# Patient Record
Sex: Female | Born: 1995 | Race: Black or African American | Hispanic: No | Marital: Single | Smoking: Former smoker
Health system: Southern US, Community
[De-identification: ages and names within clinical notes are randomized; demographics above are authoritative.]

## PROBLEM LIST (undated history)

## (undated) ENCOUNTER — Inpatient Hospital Stay (HOSPITAL_COMMUNITY): Payer: Self-pay

## (undated) DIAGNOSIS — Z3490 Encounter for supervision of normal pregnancy, unspecified, unspecified trimester: Secondary | ICD-10-CM

## (undated) DIAGNOSIS — O139 Gestational [pregnancy-induced] hypertension without significant proteinuria, unspecified trimester: Secondary | ICD-10-CM

## (undated) DIAGNOSIS — Z789 Other specified health status: Secondary | ICD-10-CM

## (undated) DIAGNOSIS — R87629 Unspecified abnormal cytological findings in specimens from vagina: Secondary | ICD-10-CM

## (undated) HISTORY — DX: Unspecified abnormal cytological findings in specimens from vagina: R87.629

## (undated) HISTORY — PX: NO PAST SURGERIES: SHX2092

## (undated) HISTORY — DX: Gestational (pregnancy-induced) hypertension without significant proteinuria, unspecified trimester: O13.9

## (undated) HISTORY — PX: OTHER SURGICAL HISTORY: SHX169

## (undated) HISTORY — DX: Encounter for supervision of normal pregnancy, unspecified, unspecified trimester: Z34.90

---
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
## 2014-02-01 ENCOUNTER — Encounter (HOSPITAL_COMMUNITY): Payer: Self-pay | Admitting: Emergency Medicine

## 2014-02-01 ENCOUNTER — Emergency Department (HOSPITAL_COMMUNITY)
Admission: EM | Admit: 2014-02-01 | Discharge: 2014-02-01 | Disposition: A | Discharge status: Home / Self Care | Payer: Medicaid Other | Attending: Emergency Medicine | Admitting: Emergency Medicine

## 2014-02-01 DIAGNOSIS — K089 Disorder of teeth and supporting structures, unspecified: Secondary | ICD-10-CM | POA: Insufficient documentation

## 2014-02-01 DIAGNOSIS — K0889 Other specified disorders of teeth and supporting structures: Secondary | ICD-10-CM

## 2014-02-01 DIAGNOSIS — Z792 Long term (current) use of antibiotics: Secondary | ICD-10-CM | POA: Insufficient documentation

## 2014-02-01 DIAGNOSIS — F17200 Nicotine dependence, unspecified, uncomplicated: Secondary | ICD-10-CM | POA: Insufficient documentation

## 2014-02-01 MED ORDER — IBUPROFEN 800 MG PO TABS: 800.0000 mg | ORAL_TABLET | Freq: Three times a day (TID) | ORAL | Status: DC

## 2014-02-01 MED ORDER — AMOXICILLIN 500 MG PO CAPS: 500.0000 mg | ORAL_CAPSULE | Freq: Three times a day (TID) | ORAL | Status: DC

## 2014-02-01 NOTE — ED Provider Notes (Signed)
CSN: [id number]     Arrival date & time 02/01/14  1515 History  This chart was scribed for Crystal Quale, PA, working with Vanetta Mulders, MD found by Elon Spanner, ED Scribe. This patient was seen in room APFT24/APFT24 and the patient's care was started at 4:28 PM.     Chief Complaint  Patient presents with  . Dental Pain    Patient is a 18 y.o. female presenting with tooth pain. The history is provided by the patient. No language interpreter was used.  Dental Pain Location:  Lower Severity:  Moderate Onset quality:  Gradual Duration:  3 days Timing:  Constant Progression:  Unchanged Chronicity:  New Context: not dental fracture   Relieved by:  Nothing Worsened by:  Nothing tried Ineffective treatments:  None tried Associated symptoms: no fever     HPI Comments: Crystal Hines is a 18 y.o. female who presents to the Emergency Department complaining of constant, gradually worsening right lower dental pain onset 3 days ago.  Patient denies any associated symptoms.  Patient denies history of DM, immune-weakening conditions.  Patient denies current pregnancy or breast feeding.  Patient denies fever, nausea, injury.   NKA.    No past medical history on file. No past surgical history on file. No family history on file. History  Substance Use Topics  . Smoking status: Current Every Day Smoker    Types: Cigarettes  . Smokeless tobacco: Not on file  . Alcohol Use: No   OB History   Grav Para Term Preterm Abortions TAB SAB Ect Mult Living                 Review of Systems  Constitutional: Negative for fever.  HENT: Positive for dental problem.   Gastrointestinal: Negative for nausea.  All other systems reviewed and are negative.     Allergies  Review of patient's allergies indicates no known allergies.  Home Medications   Prior to Admission medications   Medication Sig Start Date End Date Taking? Authorizing Provider  cephALEXin (KEFLEX) 500 MG capsule Take 1  capsule (500 mg total) by mouth 4 (four) times daily. 02/18/13   Samuel Jester, DO   BP 139/61  Pulse 93  Temp(Src) 98.5 F (36.9 C) (Oral)  Resp 18  Ht  (1.727 m)  Wt 220 lb (99.791 kg)  BMI 33.46 kg/m2  SpO2 100% Physical Exam  Nursing note and vitals reviewed. Constitutional: She is oriented to person, place, and time. She appears well-developed and well-nourished. No distress.  HENT:  Head: Normocephalic and atraumatic.  Right lower third molar erupting.  Swelling of lower gum on right.  No swelling under the tongue.  Airway is patent.  Speech is clear and understandable.   Eyes: Conjunctivae and EOM are normal.  Neck: Neck supple. No tracheal deviation present.  Cardiovascular: Normal rate.   Pulmonary/Chest: Effort normal. No respiratory distress.  Musculoskeletal: Normal range of motion.  Neurological: She is alert and oriented to person, place, and time.  Skin: Skin is warm and dry.  Psychiatric: She has a normal mood and affect. Her behavior is normal.    ED Course  Procedures (including critical care time)  DIAGNOSTIC STUDIES: Oxygen Saturation is *100**% on room air,normal by my interpretation.    COORDINATION OF CARE:  4:33 PM Will prescribed antibiotics.  Advised patient of need to follow-up.  Patient acknowledges and agrees with plan.      Labs Review Labs Reviewed - No data to display  Imaging  Review No results found.   EKG Interpretation None      MDM  No sign of Ludwig's angina. No high fever. Airway patent. No history of immune compromising med conditions.] Pt encouraged to see a dentist as soon as possible concerning the right lower 3rd molar. Rx for amoxil and ibuprofen given to the patient.   Final diagnoses:  Toothache    *I have reviewed nursing notes, vital signs, and all appropriate lab and imaging results for this patient.**  **I personally performed the services described in this documentation, which was scribed in my  presence. The recorded information has been reviewed and is accurate.Crystal Dike, PA-C 02/01/14 [phone]

## 2014-02-01 NOTE — Discharge Instructions (Signed)
Dental Pain IT IS IMPORTANT THAT YOU SEE A DENTIST AS SOON AS POSSIBLE. PLEASE USE AMOXIL AND IBUPROFEN THREE TIMES DAILY WITH FOOD.  SEE YOUR [place] MEDICAID ACCESS MD OR RETURN TO THE ED IF ANY HIGH FEVERS, OR SIGNS OF ADVANCING INFECTION.                                                                                                     Toothache is pain in or around a tooth. It may get worse with chewing or with cold or heat.  HOME CARE  Your dentist may use a numbing medicine during treatment. If so, you may need to avoid eating until the medicine wears off. Ask your dentist about this.  Only take medicine as told by your dentist or doctor.  Avoid chewing food near the painful tooth until after all treatment is done. Ask your dentist about this. GET HELP RIGHT AWAY IF:   The problem gets worse or new problems appear.  You have a fever.  There is redness and puffiness (swelling) of the face, jaw, or neck.  You cannot open your mouth.  There is pain in the jaw.  There is very bad pain that is not helped by medicine. MAKE SURE YOU:   Understand these instructions.  Will watch your condition.  Will get help right away if you are not doing well or get worse. Document Released: 11/14/2007 Document Revised: 08/20/2011 Document Reviewed: 11/14/2007 [facility] Patient Information 2015 [place], [place]. This information is not intended to replace advice given to you by your health care provider. Make sure you discuss any questions you have with your health care provider.

## 2014-02-01 NOTE — ED Notes (Signed)
Pt with dental pain for 3 days, denies seeing a dentist

## 2014-02-02 NOTE — ED Provider Notes (Signed)
Medical screening examination/treatment/procedure(s) were performed by non-physician practitioner and as supervising physician I was immediately available for consultation/collaboration.   EKG Interpretation None        Vanetta Mulders, MD 02/02/14 [phone]

## 2014-12-06 ENCOUNTER — Encounter (HOSPITAL_COMMUNITY): Payer: Self-pay | Admitting: *Deleted

## 2014-12-06 ENCOUNTER — Emergency Department (HOSPITAL_COMMUNITY)
Admission: EM | Admit: 2014-12-06 | Discharge: 2014-12-06 | Disposition: A | Discharge status: Home / Self Care | Payer: Medicaid Other | Attending: Emergency Medicine | Admitting: Emergency Medicine

## 2014-12-06 ENCOUNTER — Emergency Department (HOSPITAL_COMMUNITY): Payer: Medicaid Other

## 2014-12-06 DIAGNOSIS — R0789 Other chest pain: Secondary | ICD-10-CM

## 2014-12-06 DIAGNOSIS — R002 Palpitations: Secondary | ICD-10-CM | POA: Insufficient documentation

## 2014-12-06 DIAGNOSIS — Z720 Tobacco use: Secondary | ICD-10-CM | POA: Insufficient documentation

## 2014-12-06 LAB — CBC WITH DIFFERENTIAL/PLATELET
BASOS ABS: 0 10*3/uL (ref 0.0–0.1)
Basophils Relative: 0 % (ref 0–1)
EOS PCT: 1 % (ref 0–5)
Eosinophils Absolute: 0.1 10*3/uL (ref 0.0–0.7)
HEMATOCRIT: 40.8 % (ref 36.0–46.0)
HEMOGLOBIN: 13 g/dL (ref 12.0–15.0)
LYMPHS ABS: 2.5 10*3/uL (ref 0.7–4.0)
LYMPHS PCT: 38 % (ref 12–46)
MCH: 26.4 pg (ref 26.0–34.0)
MCHC: 31.9 g/dL (ref 30.0–36.0)
MCV: 82.9 fL (ref 78.0–100.0)
MONO ABS: 0.4 10*3/uL (ref 0.1–1.0)
MONOS PCT: 5 % (ref 3–12)
Neutro Abs: 3.7 10*3/uL (ref 1.7–7.7)
Neutrophils Relative %: 56 % (ref 43–77)
Platelets: 291 10*3/uL (ref 150–400)
RBC: 4.92 MIL/uL (ref 3.87–5.11)
RDW: 14.9 % (ref 11.5–15.5)
WBC: 6.6 10*3/uL (ref 4.0–10.5)

## 2014-12-06 LAB — URINALYSIS, ROUTINE W REFLEX MICROSCOPIC
BILIRUBIN URINE: NEGATIVE
Glucose, UA: NEGATIVE mg/dL
HGB URINE DIPSTICK: NEGATIVE
Ketones, ur: NEGATIVE mg/dL
LEUKOCYTES UA: NEGATIVE
NITRITE: NEGATIVE
PROTEIN: NEGATIVE mg/dL
Specific Gravity, Urine: 1.01 (ref 1.005–1.030)
UROBILINOGEN UA: 0.2 mg/dL (ref 0.0–1.0)
pH: 7 (ref 5.0–8.0)

## 2014-12-06 LAB — BASIC METABOLIC PANEL
ANION GAP: 8 (ref 5–15)
BUN: 8 mg/dL (ref 6–20)
CHLORIDE: 108 mmol/L (ref 101–111)
CO2: 24 mmol/L (ref 22–32)
Calcium: 9.1 mg/dL (ref 8.9–10.3)
Creatinine, Ser: 0.79 mg/dL (ref 0.44–1.00)
GFR calc Af Amer: >60 mL/min (ref >60)
GFR calc non Af Amer: >60 mL/min (ref >60)
GLUCOSE: 99 mg/dL (ref 65–99)
POTASSIUM: 3.8 mmol/L (ref 3.5–5.1)
SODIUM: 140 mmol/L (ref 135–145)

## 2014-12-06 LAB — I-STAT TROPONIN, ED: TROPONIN I, POC: 0 ng/mL (ref 0.00–0.08)

## 2014-12-06 LAB — PREGNANCY, URINE: Preg Test, Ur: NEGATIVE

## 2014-12-06 MED ORDER — IBUPROFEN 400 MG PO TABS
400.0000 mg | ORAL_TABLET | Freq: Once | ORAL | Status: AC
  Administered 2014-12-06: 400 mg via ORAL
  Filled 2014-12-06: qty 1

## 2014-12-06 MED ORDER — METHOCARBAMOL 500 MG PO TABS: 1000.0000 mg | ORAL_TABLET | Freq: Four times a day (QID) | ORAL | Status: DC | PRN

## 2014-12-06 MED ORDER — NAPROXEN 250 MG PO TABS: 250.0000 mg | ORAL_TABLET | Freq: Two times a day (BID) | ORAL | Status: DC | PRN

## 2014-12-06 MED ORDER — ACETAMINOPHEN 500 MG PO TABS
1000.0000 mg | ORAL_TABLET | Freq: Once | ORAL | Status: AC
  Administered 2014-12-06: 1000 mg via ORAL
  Filled 2014-12-06: qty 2

## 2014-12-06 NOTE — ED Notes (Signed)
Chest tightness and SOB since waking this morning. Palpitations, feeling hot x 5 today. Has also had chills today.  Has lightheadedness and pounding HA.  Has never had this before but states her mother told her it was panic attacks.

## 2014-12-06 NOTE — ED Provider Notes (Signed)
CSN: [id number]     Arrival date & time 12/06/14  1633 History   First MD Initiated Contact with Patient 12/06/14 1923     Chief Complaint  Patient presents with  . Panic Attack    HPI  Pt was seen at 1955. Per pt, c/o gradual onset and persistence of constant multiple symptoms since waking up this morning. Pt's symptoms include: generalized chest "tightness," SOB, palpitations, and "feeling hot." Pt's mother told her "it was a panic attack." Pt will not be forthcoming as to why her mother told her this. Denies injury, no fevers, no rash, no abd pain, no N/V/D, no cough, no back pain, no calf/LE pain or unilateral swelling.    History reviewed. No pertinent past medical history.   History reviewed. No pertinent past surgical history.  History  Substance Use Topics  . Smoking status: Current Every Day Smoker    Types: Cigarettes  . Smokeless tobacco: Not on file  . Alcohol Use: No    Review of Systems ROS: Statement: All systems negative except as marked or noted in the HPI; Constitutional: Negative for fever and chills. +"feeling hot."; ; Eyes: Negative for eye pain, redness and discharge. ; ; ENMT: Negative for ear pain, hoarseness, nasal congestion, sinus pressure and sore throat. ; ; Cardiovascular: +palpitations. Negative for diaphoresis, and peripheral edema. ; ; Respiratory: +SOB. Negative for cough, wheezing and stridor. ; ; Gastrointestinal: Negative for nausea, vomiting, diarrhea, abdominal pain, blood in stool, hematemesis, jaundice and rectal bleeding. . ; ; Genitourinary: Negative for dysuria, flank pain and hematuria. ; ; Musculoskeletal: +chest pain. Negative for back pain and neck pain. Negative for swelling and trauma.; ; Skin: Negative for pruritus, rash, abrasions, blisters, bruising and skin lesion.; ; Neuro: Negative for headache, lightheadedness and neck stiffness. Negative for weakness, altered level of consciousness , altered mental status, extremity weakness,  paresthesias, involuntary movement, seizure and syncope.; Psych:  No SI, no SA, no HI, no hallucinations.      Allergies  Review of patient's allergies indicates no known allergies.  Home Medications   Prior to Admission medications   Not on File   BP 137/65 mmHg  Pulse 66  Temp(Src) 98.7 F (37.1 C) (Oral)  Resp 16  Ht  (1.727 m)  Wt 237 lb (107.502 kg)  BMI 36.04 kg/m2  SpO2 98%   19:35:21 Orthostatic Vital Signs YW  Orthostatic Lying  - BP- Lying: 115/68 mmHg ; Pulse- Lying: 54  Orthostatic Sitting - BP- Sitting: 126/74 mmHg ; Pulse- Sitting: 62  Orthostatic Standing at 0 minutes - BP- Standing at 0 minutes: 142/71 mmHg ; Pulse- Standing at 0 minutes: 87     Physical Exam  2000: Physical examination:  Nursing notes reviewed; Vital signs and O2 SAT reviewed;  Constitutional: Well developed, Well nourished, Well hydrated, In no acute distress; Head:  Normocephalic, atraumatic; Eyes: EOMI, PERRL, No scleral icterus; ENMT: Mouth and pharynx normal, Mucous membranes moist; Neck: Supple, Full range of motion, No lymphadenopathy. No meningeal signs.; Cardiovascular: Regular rate and rhythm, No murmur, rub, or gallop; Respiratory: Breath sounds clear & equal bilaterally, No rales, rhonchi, wheezes.  Speaking full sentences with ease, Normal respiratory effort/excursion; Chest: No deformity, Movement normal; Abdomen: Soft, Nontender, Nondistended, Normal bowel sounds; Genitourinary: No CVA tenderness; Extremities: Pulses normal, No tenderness, No edema, No calf edema or asymmetry.; Neuro: AA&Ox3, Major CN grossly intact. No facial droop. Speech clear. No gross focal motor or sensory deficits in extremities.; Skin: Color normal, Warm, Dry.;  Psych:  Affect flat, poor eye contact. Watching TV during my HPI.      ED Course  Procedures     EKG Interpretation None      MDM  MDM Reviewed: nursing note, vitals and previous chart Interpretation: ECG, x-ray and labs     ED ECG  REPORT   Date: 12/06/2014  Rate: 75  Rhythm: normal sinus rhythm  QRS Axis: normal  Intervals: normal  ST/T Wave abnormalities: normal  Conduction Disutrbances:none  Narrative Interpretation:   Old EKG Reviewed: none available  I have personally reviewed the EKG tracing and agree with the computerized printout as noted.   Results for orders placed or performed during the hospital encounter of 12/06/14  CBC with Differential  Result Value Ref Range   WBC 6.6 4.0 - 10.5 K/uL   RBC 4.92 3.87 - 5.11 MIL/uL   Hemoglobin 13.0 12.0 - 15.0 g/dL   HCT 16.140.8 09.636.0 - 04.546.0 %   MCV 82.9 78.0 - 100.0 fL   MCH 26.4 26.0 - 34.0 pg   MCHC 31.9 30.0 - 36.0 g/dL   RDW 40.914.9 81.111.5 - 91.415.5 %   Platelets 291 150 - 400 K/uL   Neutrophils Relative % 56 43 - 77 %   Neutro Abs 3.7 1.7 - 7.7 K/uL   Lymphocytes Relative 38 12 - 46 %   Lymphs Abs 2.5 0.7 - 4.0 K/uL   Monocytes Relative 5 3 - 12 %   Monocytes Absolute 0.4 0.1 - 1.0 K/uL   Eosinophils Relative 1 0 - 5 %   Eosinophils Absolute 0.1 0.0 - 0.7 K/uL   Basophils Relative 0 0 - 1 %   Basophils Absolute 0.0 0.0 - 0.1 K/uL  Basic metabolic panel  Result Value Ref Range   Sodium 140 135 - 145 mmol/L   Potassium 3.8 3.5 - 5.1 mmol/L   Chloride 108 101 - 111 mmol/L   CO2 24 22 - 32 mmol/L   Glucose, Bld 99 65 - 99 mg/dL   BUN 8 6 - 20 mg/dL   Creatinine, Ser 7.820.79 0.44 - 1.00 mg/dL   Calcium 9.1 8.9 - 95.610.3 mg/dL   GFR calc non Af Amer >60 >60 mL/min   GFR calc Af Amer >60 >60 mL/min   Anion gap 8 5 - 15  Pregnancy, urine  Result Value Ref Range   Preg Test, Ur NEGATIVE NEGATIVE  Urinalysis, Routine w reflex microscopic ([facility])  Result Value Ref Range   Color, Urine YELLOW YELLOW   APPearance CLEAR CLEAR   Specific Gravity, Urine 1.010 1.005 - 1.030   pH 7.0 5.0 - 8.0   Glucose, UA NEGATIVE NEGATIVE mg/dL   Hgb urine dipstick NEGATIVE NEGATIVE   Bilirubin Urine NEGATIVE NEGATIVE   Ketones, ur NEGATIVE NEGATIVE mg/dL   Protein, ur  NEGATIVE NEGATIVE mg/dL   Urobilinogen, UA 0.2 0.0 - 1.0 mg/dL   Nitrite NEGATIVE NEGATIVE   Leukocytes, UA NEGATIVE NEGATIVE  I-stat troponin, ED  Result Value Ref Range   Troponin i, poc 0.00 0.00 - 0.08 ng/mL   Comment 3           Dg Chest 2 View 12/06/2014   CLINICAL DATA:  Chest pain and shortness of breath.  EXAM: CHEST  2 VIEW  COMPARISON:  July 09, 2013  FINDINGS: Lungs are clear. Heart size and pulmonary vascularity are normal. No pneumothorax. No adenopathy. No bone lesions.  IMPRESSION: No abnormality noted.   Electronically Signed   By:  Bretta Bang III M.D.   On: 12/06/2014 16:56    2120:  CP improving after meds. Pt is currently texting on her cellphone and watching TV. Appears NAD, resps easy. VS remain stable. Doubt PE as cause for symptoms with low risk [place].  Doubt ACS as cause for symptoms with normal troponin and EKG after 12+ hours of constant symptoms. Tx symptomatically at this time. Dx and testing d/w pt and family.  Questions answered.  Verb understanding, agreeable to d/c home with outpt f/u.   Samuel Jester, DO 12/09/14 [phone]

## 2014-12-06 NOTE — Discharge Instructions (Signed)
°Emergency Department Resource Guide °1) Find a Doctor and Pay Out of Pocket °Although you won't have to find out who is covered by your insurance plan, it is a good idea to ask around and get recommendations. You will then need to call the office and see if the doctor you have chosen will accept you as a new patient and what types of options they offer for patients who are self-pay. Some doctors offer discounts or will set up payment plans for their patients who do not have insurance, but you will need to ask so you aren't surprised when you get to your appointment. ° °2) Contact Your Local Health Department °Not all health departments have doctors that can see patients for sick visits, but many do, so it is worth a call to see if yours does. If you don't know where your local health department is, you can check in your phone book. The CDC also has a tool to help you locate your state's health department, and many state websites also have listings of all of their local health departments. ° °3) Find a Walk-in Clinic °If your illness is not likely to be very severe or complicated, you may want to try a walk in clinic. These are popping up all over the country in pharmacies, drugstores, and shopping centers. They're usually staffed by nurse practitioners or physician assistants that have been trained to treat common illnesses and complaints. They're usually fairly quick and inexpensive. However, if you have serious medical issues or chronic medical problems, these are probably not your best option. ° °No Primary Care Doctor: °- Call [facility] Connect at  832-8000 - they can help you locate a primary care doctor that  accepts your insurance, provides certain services, etc. °- Physician Referral Service- 1-800-533-3463 ° °Chronic Pain Problems: °Organization         Address  Phone   Notes  °[facility]  (336) [phone] Patients need to be referred by their primary care doctor.  ° °Medication  Assistance: °Organization         Address  Phone   Notes  °[place] [facility] [address] °[place], [place] [address] (336) 641-8030 --Must be a resident of [place] °-- Must have NO insurance coverage whatsoever (no Medicaid/ Medicare, etc.) °-- The pt. MUST have a primary care doctor that directs their care regularly and follows them in the community °  °[facility]  (866) 331-1348   °[employer]  (888) 892-1162   ° °Agencies that provide inexpensive medical care: °Organization         Address  Phone   Notes  °[facility]  (336) 832-8035   °[facility]    (336) 832-7272   °[facility] [address] °[place], [place] [address] (336) 832-4777   °[facility] [address], °[place] (336) 271-4999   °[facility]    (336) 373-0678   °[facility]    (336) 272-1050   °[facility] ° [address], [place] Phone:  (336) 832-4444, Fax:  (336) 832-4440 Hours of Operation:  9 am - 6 pm, M-F.  Also accepts Medicaid/Medicare and self-pay.  °[facility] ° [address], [place] Phone: (336) [phone], Fax: (336) [phone]. Hours of Operation:  8:30 am - 5:30 pm, M-F.  Also accepts Medicaid and self-pay.  °[facility] [place] [address], [place] Phone: (336) 878-6027   °[facility] [address], [place], [place] (336)723-1848, Ext. 123 Mondays & Thursdays: 7-9 AM.  First 15 patients are seen on a first come, first serve basis. °  ° °Medicaid-accepting [place] Providers: ° °Organization         Address  Phone   Notes  °[facility] [address], [place] (336) 641-2100 Also accepts self-pay patients.  °[facility] [address], [place] ° (336) 856-9996   °[facility] [address], [place]  (336) 288-8857   °[facility] [address] [place] [address], [place] (336) 299-7000   °Veita Bland [address], [place]  ° (336) 373-1557 Only accepts [place] Access Medicaid patients after they have their name applied to their card.  ° °Self-Pay (no insurance) in [place]: ° °Organization         Address  Phone   Notes  °Sickle Cell Patients, [facility] [address], [place] (336) 832-1970   °[facility] [address], [place] (336) 832-4400   °[facility] ° [address], [place] (336) 992-4800   °[facility]/Dr. Osei-Bonsu ° [address] [place] [address], [place] or [address], [place] (336) 841-8500 Phone number for both [place] and [place] locations is the same.  °[facility] [address], [place] (336) 299-0000   °[facility] [address] [place] [address], [place] or [address] (336) 852-7530 °(336) 878-2260   °[facility] [address], [place] (336) 350-1642, phone; (336) 294-5005, fax Sees patients 1st and 3rd Saturday of every month.  Must not qualify for public or private insurance (i.e. Medicaid, Medicare, [place] Health Choice, Veterans' Benefits) • Household income should be no more than 200% of the poverty level •The clinic cannot treat you if you are pregnant or think you are pregnant • Sexually transmitted diseases are not treated at the clinic.  ° ° °Dental Care: °Organization         Address  Phone  Notes  °[place] [facility] [address], [place] (336) 641-6152 Accepts children up to age 21 who are enrolled in Medicaid or [place] Health Choice; pregnant women with a Medicaid card; and children who have applied for Medicaid or [place] Health Choice, but were declined, whose parents can pay a reduced fee at time of service.  °[place]  [facility] [place]  [address], [place] (336) 641-7733 Accepts children up to age 21 who are enrolled in Medicaid or [place] Health Choice; pregnant women with a Medicaid card; and children who have applied for Medicaid or [place] Health Choice, but were declined, whose parents can pay a reduced fee at time of service.  °[facility] ° [address], [place] (336) 641-4533 Patients are seen by appointment only. Walk-ins are not accepted. [facility] will see patients 18 years of age and older. °Monday - Tuesday (8am-5pm) °Most Wednesdays (8:30-5pm) °$30 per visit, cash only  °[facility] ° [address], [place] (336) 641-4533 Patients are seen by appointment only. Walk-ins are not accepted. [facility] will see patients 18 years of age and older. °One   Wednesday Evening (Monthly: Volunteer Based).  $30 per visit, cash only  °[facility]  (919) 537-3737 for adults; Children under age 4, call Graduate Pediatric Dentistry at ([phone]) [phone]. Children aged 4-14, please call ([phone]) [phone] to request a pediatric application. ° Dental services are provided in all areas of dental care including fillings, crowns and bridges, complete and partial dentures, implants, gum treatment, root canals, and extractions. Preventive care is also provided. Treatment is provided to both adults and children. °Patients are selected via a lottery and there is often a waiting list. °  °[facility] [address], °[place] ° (336) 763-8833 [email or web address] °  °[facility] [address], [place], [place] (336)723-1848, Ext. 123 Second and Fourth Thursday of each month, opens at 6:30 AM; Clinic ends at 9 AM.  Patients are seen on a first-come first-served basis, and a limited number are seen during each clinic.  ° °[facility] ° [address], [place], [place] (336) 723-7904    Eligibility Requirements °You must have lived in [place], [place], or [place] counties for at least the last three months. °  You cannot be eligible for state or federal sponsored healthcare insurance, including Veterans Administration, Medicaid, or Medicare. °  You generally cannot be eligible for healthcare insurance through your employer.  °  How to apply: °Eligibility screenings are held every Tuesday and Wednesday afternoon from 1:00 pm until 4:00 pm. You do not need an appointment for the interview!  °[facility] [address], [place], [place] 336-631-2330   °[facility]  336-342-8273   °[facility]  336-703-3100   °[facility]  336-570-6415   ° °Behavioral Health Resources in the Community: °Intensive Outpatient Programs °Organization         Address  Phone  Notes  °[facility] [address], [place], [place] 336-878-6098   °[facility] [address], [place], [place] 336-832-9800   °[facility] [address], [place], [place] ° 336-882-2125   °[facility] [address],  °[place], [place] 1-800-853-5163 or 336-641-4981   °Substance Abuse Resources °Organization         Address  Phone  Notes  °[facility]  336-882-2125   °[facility]  336-784-9470   °[facility]  336-285-9073   °[facility]  336-845-3988   °Residential & Outpatient Substance Abuse Program  1-800-659-3381   °Psychological Services °Organization         Address  Phone  Notes  °[facility]  336- 832-9600   °[facility]  336- 378-7881   °[facility] [address], [place] 1-800-853-5163 or 336-641-4981   ° °Mobile Crisis Teams °Organization         Address  Phone  Notes  °[facility], Mobile Crisis Care Unit  1-877-626-1772   °Assertive °[facility] ° [address]  [place], [place] 336-834-9664   °Sharon DeEsch [address] °[place] 336-554-5454   ° °Self-Help/Support Groups °Organization         Address  Phone             Notes  °[facility]  - variety of support groups  336- 373-1402 Call for more information  °Narcotics Anonymous (NA), [facility] [address], °[place]  2 meetings at this location  ° °  Residential Treatment Programs Organization         Address  Phone  Notes  [facility] [address],    [place]  [phone]   [facility]  [address], [place] [phone], [place], [place] [phone]   [facility] [address], [place] [phone] Admissions: 8am-3pm M-F  [facility] [address]    [place], [place] [phone]([phone]) [phone]   [facility] [address], [place], [place] [phone]) [phone]   [facility] [address]  [place], [place] [phone]   [facility] Intensive Outpatient [address], [place], [place] [phone]   [facility] ([facility] [address]  [place], [place] [phone] or [phone]   [facility] [address] [place], [place] [phone] Accepts Medicaid  [facility] [address]  [place] [phone] Substance Abuse/Addiction Treatment   [facility] Organization         Address  Phone  Notes  [facility]  (424)[phone](888) [phone]   Angie FavaJulie Brannon, PhD [address], [address] [place], [place]   [phone](336) (520)[phone] or [phone](336) [phone]   [facility]   [address] [place], [place] [phone](336) [phone]   [facility] [address], [place], [place] ([phone](336) ([phone] Insurance/Medicaid/sponsorship through [facility] [address]                                    [place], [place] [phone](336) [phone] Therapy/tele-psych/case    [facility] [address]   [place], [place] [phone](336) [phone]    Dr. Lolly MustacheArfeen  [phone](336) [phone]   [facility]  [employer] [facility] 1) [address], [place] 2) [address], [place] 3)  [address], [place] ([phone]) [phone](336) [phone](336) [phone]  [phone](336) [phone]   [facility] Child Abuse Hot[facility] (510) 367-9302(336) (724)433-4727 or 747-123-2417(336) 269-184-0009 (After Hours)      Take the prescriptions as directed. Also take over the counter tylenol, as directed on packaging, as needed for discomfort. Apply moist heat or ice to the area(s) of discomfort, for 15 minutes at a time, several times per day for the next few days.  Do not fall asleep on a heating or ice pack.  Call your regular medical doctor tomorrow to schedule a follow up appointment this week.  Return to the Emergency Department immediately if worsening.

## 2015-08-23 ENCOUNTER — Encounter: Payer: Self-pay | Admitting: Adult Health

## 2015-08-23 ENCOUNTER — Ambulatory Visit (INDEPENDENT_AMBULATORY_CARE_PROVIDER_SITE_OTHER): Payer: Medicaid Other | Admitting: Adult Health

## 2015-08-23 VITALS — BP 150/70 | HR 80 | Ht 68.0 in | Wt 232.0 lb

## 2015-08-23 DIAGNOSIS — N926 Irregular menstruation, unspecified: Secondary | ICD-10-CM

## 2015-08-23 DIAGNOSIS — R51 Headache: Secondary | ICD-10-CM

## 2015-08-23 DIAGNOSIS — Z3201 Encounter for pregnancy test, result positive: Secondary | ICD-10-CM | POA: Diagnosis not present

## 2015-08-23 DIAGNOSIS — Z3490 Encounter for supervision of normal pregnancy, unspecified, unspecified trimester: Secondary | ICD-10-CM

## 2015-08-23 DIAGNOSIS — R252 Cramp and spasm: Secondary | ICD-10-CM | POA: Diagnosis not present

## 2015-08-23 DIAGNOSIS — O3680X0 Pregnancy with inconclusive fetal viability, not applicable or unspecified: Secondary | ICD-10-CM

## 2015-08-23 HISTORY — DX: Encounter for supervision of normal pregnancy, unspecified, unspecified trimester: Z34.90

## 2015-08-23 LAB — POCT URINE PREGNANCY: Preg Test, Ur: POSITIVE — AB

## 2015-08-23 MED ORDER — PRENATAL PLUS 27-1 MG PO TABS: 1.0000 | ORAL_TABLET | Freq: Every day | ORAL | Status: DC

## 2015-08-23 NOTE — Patient Instructions (Addendum)
First Trimester of Pregnancy The first trimester of pregnancy is from week 1 until the end of week 12 (months 1 through 3). A week after a sperm fertilizes an egg, the egg will implant on the wall of the uterus. This embryo will begin to develop into a baby. Genes from you and your partner are forming the baby. The female genes determine whether the baby is a boy or a girl. At 6-8 weeks, the eyes and face are formed, and the heartbeat can be seen on ultrasound. At the end of 12 weeks, all the baby's organs are formed.  Now that you are pregnant, you will want to do everything you can to have a healthy baby. Two of the most important things are to get good prenatal care and to follow your health care provider's instructions. Prenatal care is all the medical care you receive before the baby's birth. This care will help prevent, find, and treat any problems during the pregnancy and childbirth. BODY CHANGES Your body goes through many changes during pregnancy. The changes vary from woman to woman.   You may gain or lose a couple of pounds at first.  You may feel sick to your stomach (nauseous) and throw up (vomit). If the vomiting is uncontrollable, call your health care provider.  You may tire easily.  You may develop headaches that can be relieved by medicines approved by your health care provider.  You may urinate more often. Painful urination may mean you have a bladder infection.  You may develop heartburn as a result of your pregnancy.  You may develop constipation because certain hormones are causing the muscles that push waste through your intestines to slow down.  You may develop hemorrhoids or swollen, bulging veins (varicose veins).  Your breasts may begin to grow larger and become tender. Your nipples may stick out more, and the tissue that surrounds them (areola) may become darker.  Your gums may bleed and may be sensitive to brushing and flossing.  Dark spots or blotches (chloasma,  mask of pregnancy) may develop on your face. This will likely fade after the baby is born.  Your menstrual periods will stop.  You may have a loss of appetite.  You may develop cravings for certain kinds of food.  You may have changes in your emotions from day to day, such as being excited to be pregnant or being concerned that something may go wrong with the pregnancy and baby.  You may have more vivid and strange dreams.  You may have changes in your hair. These can include thickening of your hair, rapid growth, and changes in texture. Some women also have hair loss during or after pregnancy, or hair that feels dry or thin. Your hair will most likely return to normal after your baby is born. WHAT TO EXPECT AT YOUR PRENATAL VISITS During a routine prenatal visit:  You will be weighed to make sure you and the baby are growing normally.  Your blood pressure will be taken.  Your abdomen will be measured to track your baby's growth.  The fetal heartbeat will be listened to starting around week 10 or 12 of your pregnancy.  Test results from any previous visits will be discussed. Your health care provider may ask you:  How you are feeling.  If you are feeling the baby move.  If you have had any abnormal symptoms, such as leaking fluid, bleeding, severe headaches, or abdominal cramping.  If you are using any tobacco products,   including cigarettes, chewing tobacco, and electronic cigarettes.  If you have any questions. Other tests that may be performed during your first trimester include:  Blood tests to find your blood type and to check for the presence of any previous infections. They will also be used to check for low iron levels (anemia) and Rh antibodies. Later in the pregnancy, blood tests for diabetes will be done along with other tests if problems develop.  Urine tests to check for infections, diabetes, or protein in the urine.  An ultrasound to confirm the proper growth  and development of the baby.  An amniocentesis to check for possible genetic problems.  Fetal screens for spina bifida and Down syndrome.  You may need other tests to make sure you and the baby are doing well.  HIV (human immunodeficiency virus) testing. Routine prenatal testing includes screening for HIV, unless you choose not to have this test. HOME CARE INSTRUCTIONS  Medicines  Follow your health care provider's instructions regarding medicine use. Specific medicines may be either safe or unsafe to take during pregnancy.  Take your prenatal vitamins as directed.  If you develop constipation, try taking a stool softener if your health care provider approves. Diet  Eat regular, well-balanced meals. Choose a variety of foods, such as meat or vegetable-based protein, fish, milk and low-fat dairy products, vegetables, fruits, and whole grain breads and cereals. Your health care provider will help you determine the amount of weight gain that is right for you.  Avoid raw meat and uncooked cheese. These carry germs that can cause birth defects in the baby.  Eating four or five small meals rather than three large meals a day may help relieve nausea and vomiting. If you start to feel nauseous, eating a few soda crackers can be helpful. Drinking liquids between meals instead of during meals also seems to help nausea and vomiting.  If you develop constipation, eat more high-fiber foods, such as fresh vegetables or fruit and whole grains. Drink enough fluids to keep your urine clear or pale yellow. Activity and Exercise  Exercise only as directed by your health care provider. Exercising will help you:  Control your weight.  Stay in shape.  Be prepared for labor and delivery.  Experiencing pain or cramping in the lower abdomen or low back is a good sign that you should stop exercising. Check with your health care provider before continuing normal exercises.  Try to avoid standing for long  periods of time. Move your legs often if you must stand in one place for a long time.  Avoid heavy lifting.  Wear low-heeled shoes, and practice good posture.  You may continue to have sex unless your health care provider directs you otherwise. Relief of Pain or Discomfort  Wear a good support bra for breast tenderness.   Take warm sitz baths to soothe any pain or discomfort caused by hemorrhoids. Use hemorrhoid cream if your health care provider approves.   Rest with your legs elevated if you have leg cramps or low back pain.  If you develop varicose veins in your legs, wear support hose. Elevate your feet for 15 minutes, 3-4 times a day. Limit salt in your diet. Prenatal Care  Schedule your prenatal visits by the twelfth week of pregnancy. They are usually scheduled monthly at first, then more often in the last 2 months before delivery.  Write down your questions. Take them to your prenatal visits.  Keep all your prenatal visits as directed by your   health care provider. Safety  Wear your seat belt at all times when driving.  Make a list of emergency phone numbers, including numbers for family, friends, the hospital, and police and fire departments. General Tips  Ask your health care provider for a referral to a local prenatal education class. Begin classes no later than at the beginning of month 6 of your pregnancy.  Ask for help if you have counseling or nutritional needs during pregnancy. Your health care provider can offer advice or refer you to specialists for help with various needs.  Do not use hot tubs, steam rooms, or saunas.  Do not douche or use tampons or scented sanitary pads.  Do not cross your legs for long periods of time.  Avoid cat litter boxes and soil used by cats. These carry germs that can cause birth defects in the baby and possibly loss of the fetus by miscarriage or stillbirth.  Avoid all smoking, herbs, alcohol, and medicines not prescribed by  your health care provider. Chemicals in these affect the formation and growth of the baby.  Do not use any tobacco products, including cigarettes, chewing tobacco, and electronic cigarettes. If you need help quitting, ask your health care provider. You may receive counseling support and other resources to help you quit.  Schedule a dentist appointment. At home, brush your teeth with a soft toothbrush and be gentle when you floss. SEEK MEDICAL CARE IF:   You have dizziness.  You have mild pelvic cramps, pelvic pressure, or nagging pain in the abdominal area.  You have persistent nausea, vomiting, or diarrhea.  You have a bad smelling vaginal discharge.  You have pain with urination.  You notice increased swelling in your face, hands, legs, or ankles. SEEK IMMEDIATE MEDICAL CARE IF:   You have a fever.  You are leaking fluid from your vagina.  You have spotting or bleeding from your vagina.  You have severe abdominal cramping or pain.  You have rapid weight gain or loss.  You vomit blood or material that looks like coffee grounds.  You are exposed to [place] measles and have never had them.  You are exposed to fifth disease or chickenpox.  You develop a severe headache.  You have shortness of breath.  You have any kind of trauma, such as from a fall or a car accident.   This information is not intended to replace advice given to you by your health care provider. Make sure you discuss any questions you have with your health care provider.   Document Released: 05/22/2001 Document Revised: 06/18/2014 Document Reviewed: 04/07/2013 Elsevier Interactive Patient Education [employer]2016 [employer] Inc. Return in 2 weeks for dating [place] Eat often  Ok take to tylenol

## 2015-08-23 NOTE — Progress Notes (Signed)
Subjective:     Patient ID: Crystal KendallElexus K Hines, female   DOB: Sep 20, 1995, 20 y.o.   MRN: [id number]  HPI [name] is a 20 year old black female, in for UPT, has had 2+ HPT after missing a period.Has some cramps, no bleeding or nausea, has headache almost daily and breast tenderness.Has not taken any tylenol for headache yet.   Review of Systems Patient denies any hearing loss, fatigue, blurred vision, shortness of breath, chest pain, abdominal pain, problems with bowel movements, urination, or intercourse. No joint pain or mood swings. See HPI for positives. Reviewed past medical,surgical, social and family history. Reviewed medications and allergies.     Objective:   Physical Exam BP 150/70 mmHg  Pulse 80  Ht 5\' 8"  (1.727 m)  Wt 232 lb (105.235 kg)  BMI 35.28 kg/m2  LMP 07/16/2015 UPT + about 5+ 3 weeks by LMP, EDD 04/21/16, medicaid form given, Skin warm and dry. Neck: mid line trachea, normal thyroid, good ROM, no lymphadenopathy noted. Lungs: clear to ausculation bilaterally. Cardiovascular: regular rate and rhythm.Abdomen soft and non tender, no HSM    Assessment:     +UPT Pregnant    Plan:    Eat often and increase fluids  Rx prenatal plus #30 take 1 daily with 11 refills,ok to do Gummies for now or 2 flintstones Return in 2 weeks for dating US Review handout on first trimester Ok to take tylenol

## 2015-09-06 ENCOUNTER — Ambulatory Visit (INDEPENDENT_AMBULATORY_CARE_PROVIDER_SITE_OTHER): Payer: Medicaid Other

## 2015-09-06 DIAGNOSIS — O3680X0 Pregnancy with inconclusive fetal viability, not applicable or unspecified: Secondary | ICD-10-CM | POA: Diagnosis not present

## 2015-09-06 NOTE — Progress Notes (Signed)
US 7+3 wks,single IUP pos fht 113 bpm,normal ov's bilat,crl 11.423mm

## 2015-09-15 ENCOUNTER — Encounter: Payer: Medicaid Other | Admitting: Advanced Practice Midwife

## 2015-09-26 ENCOUNTER — Encounter (HOSPITAL_COMMUNITY): Payer: Self-pay | Admitting: Emergency Medicine

## 2015-09-26 ENCOUNTER — Emergency Department (HOSPITAL_COMMUNITY): Payer: Medicaid Other

## 2015-09-26 ENCOUNTER — Emergency Department (HOSPITAL_COMMUNITY)
Admission: EM | Admit: 2015-09-26 | Discharge: 2015-09-26 | Disposition: A | Discharge status: Home / Self Care | Payer: Medicaid Other | Attending: Emergency Medicine | Admitting: Emergency Medicine

## 2015-09-26 DIAGNOSIS — Z87891 Personal history of nicotine dependence: Secondary | ICD-10-CM | POA: Insufficient documentation

## 2015-09-26 DIAGNOSIS — R072 Precordial pain: Secondary | ICD-10-CM | POA: Diagnosis not present

## 2015-09-26 DIAGNOSIS — R079 Chest pain, unspecified: Secondary | ICD-10-CM

## 2015-09-26 LAB — CBC
HEMATOCRIT: 38.3 % (ref 36.0–46.0)
HEMOGLOBIN: 12.6 g/dL (ref 12.0–15.0)
MCH: 27.4 pg (ref 26.0–34.0)
MCHC: 32.9 g/dL (ref 30.0–36.0)
MCV: 83.3 fL (ref 78.0–100.0)
Platelets: 314 10*3/uL (ref 150–400)
RBC: 4.6 MIL/uL (ref 3.87–5.11)
RDW: 13.7 % (ref 11.5–15.5)
WBC: 5.5 10*3/uL (ref 4.0–10.5)

## 2015-09-26 LAB — I-STAT CHEM 8, ED
BUN: 12 mg/dL (ref 6–20)
CALCIUM ION: 1.15 mmol/L (ref 1.12–1.23)
CHLORIDE: 102 mmol/L (ref 101–111)
CREATININE: 0.9 mg/dL (ref 0.44–1.00)
GLUCOSE: 92 mg/dL (ref 65–99)
HCT: 42 % (ref 36.0–46.0)
Hemoglobin: 14.3 g/dL (ref 12.0–15.0)
POTASSIUM: 3.7 mmol/L (ref 3.5–5.1)
Sodium: 142 mmol/L (ref 135–145)
TCO2: 25 mmol/L (ref 0–100)

## 2015-09-26 LAB — I-STAT TROPONIN, ED: Troponin i, poc: 0 ng/mL (ref 0.00–0.08)

## 2015-09-26 LAB — D-DIMER, QUANTITATIVE: D-Dimer, Quant: <0.27 ug/mL-FEU (ref 0.00–0.50)

## 2015-09-26 MED ORDER — KETOROLAC TROMETHAMINE 30 MG/ML IJ SOLN
30.0000 mg | Freq: Once | INTRAMUSCULAR | Status: AC
  Administered 2015-09-26: 30 mg via INTRAVENOUS
  Filled 2015-09-26: qty 1

## 2015-09-26 MED ORDER — IBUPROFEN 800 MG PO TABS: 800.0000 mg | ORAL_TABLET | Freq: Three times a day (TID) | ORAL | Status: DC

## 2015-09-26 NOTE — ED Notes (Signed)
Patient states central chest pain that radiates into left shoulder. Patient denies injury-states pain is worse with deep breathing. Patient lying in bed, eyes, closed during assessment.

## 2015-09-26 NOTE — Discharge Instructions (Signed)
Please obtain all of your results from medical records or have your doctors office obtain the results - share them with your doctor - you should be seen at your doctors office in the next 2 days. Call today to arrange your follow up. Take the medications as prescribed. Please review all of the medicines and only take them if you do not have an allergy to them. Please be aware that if you are taking birth control pills, taking other prescriptions, ESPECIALLY ANTIBIOTICS may make the birth control ineffective - if this is the case, either do not engage in sexual activity or use alternative methods of birth control such as condoms until you have finished the medicine and your family doctor says it is OK to restart them. If you are on a blood thinner such as COUMADIN, be aware that any other medicine that you take may cause the coumadin to either work too much, or not enough - you should have your coumadin level rechecked in next 7 days if this is the case.  °?  °It is also a possibility that you have an allergic reaction to any of the medicines that you have been prescribed - Everybody reacts differently to medications and while MOST people have no trouble with most medicines, you may have a reaction such as nausea, vomiting, rash, swelling, shortness of breath. If this is the case, please stop taking the medicine immediately and contact your physician.  °?  °You should return to the ER if you develop severe or worsening symptoms.  ° °[facility] Doctor List ° ° ° °Edward Hawkins MD. Specialty: Pulmonary Disease Contact information: [address]  °[address]  °[place] [address]  °[phone]  ° °Margaret Simpson, MD. Specialty: Family Medicine Contact information: [address]  °[place] [address]  °[phone]  ° °Scott Luking, MD. Specialty: Family Medicine Contact information: [address]  °[address]  °[place] [address]  °[phone]  ° °Tesfaye Fanta, MD Specialty:  Internal Medicine Contact information: [address]  °[place] [address]  °[phone]  ° °Zach Hall, MD. Specialty: Internal Medicine Contact information: [address]  °[place] [address]  °[phone]  ° °Angus Mcinnis, MD. Specialty: Family Medicine Contact information: [address]  °[place] [address]  °[phone]  ° °Stephen Knowlton, MD. Specialty: Family Medicine Contact information: [address]  °[address]  °[place] [address]  °[phone]  ° °Roy Fagan, MD. Specialty: Internal Medicine Contact information: [address]  °[address]  °  [address]  °[phone]  ° °

## 2015-09-26 NOTE — ED Provider Notes (Signed)
CSN: [id number]     Arrival date & time 09/26/15  1226 History   First MD Initiated Contact with Patient 09/26/15 1300     Chief Complaint  Patient presents with  . Chest Pain     (Consider location/radiation/quality/duration/timing/severity/associated sxs/prior Treatment) HPI Comments: Upper and lower chest wall pain started this AM - has started to have CP this morning - tightness, no radiation - no SOb and no n/v/diaphoresis.    Has no hx of CAD No RF for PE or CAD Has no swellign in legs / travel / hormones No injury.  Sx are intermittent, worse with deep breathing.  Patient is a 20 y.o. female presenting with chest pain. The history is provided by the patient.  Chest Pain   Past Medical History  Diagnosis Date  . Pregnant 08/23/2015   No past surgical history on file. Family History  Problem Relation Age of Onset  . Hypertension Mother   . Hypertension Father   . Other Father     blood clots  . Hypertension Maternal Grandmother   . Hypertension Maternal Grandfather   . Congestive Heart Failure Paternal Grandfather    Social History  Substance Use Topics  . Smoking status: Former Smoker    Types: Cigarettes  . Smokeless tobacco: Never Used  . Alcohol Use: No   OB History    Gravida Para Term Preterm AB TAB SAB Ectopic Multiple Living   1              Review of Systems  Cardiovascular: Positive for chest pain.  All other systems reviewed and are negative.     Allergies  Review of patient's allergies indicates no known allergies.  Home Medications   Prior to Admission medications   Medication Sig Start Date End Date Taking? Authorizing Provider  ibuprofen (ADVIL,MOTRIN) 800 MG tablet Take 1 tablet (800 mg total) by mouth 3 (three) times daily. 09/26/15   Eber Hong, MD  prenatal vitamin w/FE, FA (PRENATAL 1 + 1) 27-1 MG TABS tablet Take 1 tablet by mouth daily at 12 noon. Patient not taking: Reported on 09/26/2015 08/23/15   Adline Potter, NP    BP 152/69 mmHg  Pulse 80  Temp(Src) 98.7 F (37.1 C) (Oral)  Resp 18  Ht  (1.727 m)  Wt 212 lb (96.163 kg)  BMI 32.24 kg/m2  SpO2 100%  LMP 09/26/2015  Breastfeeding? Unknown Physical Exam  Constitutional: She appears well-developed and well-nourished. No distress.  HENT:  Head: Normocephalic and atraumatic.  Mouth/Throat: Oropharynx is clear and moist. No oropharyngeal exudate.  Eyes: Conjunctivae and EOM are normal. Pupils are equal, round, and reactive to light. Right eye exhibits no discharge. Left eye exhibits no discharge. No scleral icterus.  Neck: Normal range of motion. Neck supple. No JVD present. No thyromegaly present.  Cardiovascular: Normal rate, regular rhythm, normal heart sounds and intact distal pulses.  Exam reveals no gallop and no friction rub.   No murmur heard. Pulmonary/Chest: Effort normal and breath sounds normal. No respiratory distress. She has no wheezes. She has no rales. She exhibits tenderness ( reproducible ttp over teh upper and lower sternum.).  Abdominal: Soft. Bowel sounds are normal. She exhibits no distension and no mass. There is no tenderness.  Musculoskeletal: Normal range of motion. She exhibits no edema or tenderness.  Lymphadenopathy:    She has no cervical adenopathy.  Neurological: She is alert. Coordination normal.  Skin: Skin is warm and dry. No rash noted. No erythema.  Psychiatric: She has a normal mood and affect. Her behavior is normal.  Nursing note and vitals reviewed.   ED Course  Procedures (including critical care time) Labs Review Labs Reviewed  CBC  D-DIMER, QUANTITATIVE (NOT [facility])  I-STAT CHEM 8, ED  Rosezena SensorI-STAT TROPOININ, ED    Imaging Review Dg Chest 2 View  09/26/2015  CLINICAL DATA:  Central chest pain and left shoulder pain today. EXAM: CHEST  2 VIEW COMPARISON:  12/06/2014 FINDINGS: The cardiac silhouette, mediastinal and hilar contours are within normal limits and stable. The lungs are clear. The  bony thorax is intact. IMPRESSION: Normal chest x-ray. Electronically Signed   By: Rudie MeyerP.  Gallerani M.D.   On: 09/26/2015 13:42   I have personally reviewed and evaluated these images and lab results as part of my medical decision-making.   EKG Interpretation   Date/Time:  Monday September 26 2015 12:45:27 EDT Ventricular Rate:  89 PR Interval:  136 QRS Duration: 78 QT Interval:  364 QTC Calculation: 442 R Axis:   38 Text Interpretation:  Normal sinus rhythm Possible Left atrial enlargement  Borderline ECG Confirmed by Rubin PayorPICKERING  MD, Harrold DonathNATHAN [phone]([id number]) on 09/26/2015  12:49:12 PM      MDM   Final diagnoses:  Chest pain, unspecified chest pain type    VS normal other than mild htn, ECG without ischemia - r/o PE / PTX, well appearing otherwise.  D dimer neg Trop neg Labs and xdray reassuring Pt informed Stable for d/c.  The pt was informed of her results, stable for d/c, likely chest wall syndrome  Eber HongBrian Warda Mcqueary, MD 09/26/15 1440

## 2015-09-26 NOTE — ED Notes (Signed)
Having chest pain since 10 am.  Says pain is squeezing and none radiating.

## 2015-09-26 NOTE — ED Notes (Signed)
Patient ambulatory to restroom with standby assist. Steady gait no deficits

## 2015-09-27 ENCOUNTER — Encounter: Payer: Medicaid Other | Admitting: Women's Health

## 2016-03-01 ENCOUNTER — Emergency Department (HOSPITAL_COMMUNITY)
Admission: EM | Admit: 2016-03-01 | Discharge: 2016-03-01 | Disposition: A | Discharge status: Home / Self Care | Payer: Medicaid Other | Attending: Emergency Medicine | Admitting: Emergency Medicine

## 2016-03-01 ENCOUNTER — Encounter (HOSPITAL_COMMUNITY): Payer: Self-pay | Admitting: Cardiology

## 2016-03-01 DIAGNOSIS — B9789 Other viral agents as the cause of diseases classified elsewhere: Secondary | ICD-10-CM

## 2016-03-01 DIAGNOSIS — Z791 Long term (current) use of non-steroidal anti-inflammatories (NSAID): Secondary | ICD-10-CM | POA: Diagnosis not present

## 2016-03-01 DIAGNOSIS — R05 Cough: Secondary | ICD-10-CM | POA: Diagnosis present

## 2016-03-01 DIAGNOSIS — Z79899 Other long term (current) drug therapy: Secondary | ICD-10-CM | POA: Diagnosis not present

## 2016-03-01 DIAGNOSIS — Z87891 Personal history of nicotine dependence: Secondary | ICD-10-CM | POA: Diagnosis not present

## 2016-03-01 DIAGNOSIS — J069 Acute upper respiratory infection, unspecified: Secondary | ICD-10-CM | POA: Diagnosis not present

## 2016-03-01 MED ORDER — DEXAMETHASONE 4 MG PO TABS: 4.0000 mg | ORAL_TABLET | Freq: Two times a day (BID) | ORAL | 0 refills | Status: DC

## 2016-03-01 MED ORDER — GUAIFENESIN-CODEINE 100-10 MG/5ML PO SYRP: 5.0000 mL | ORAL_SOLUTION | Freq: Three times a day (TID) | ORAL | 0 refills | Status: DC | PRN

## 2016-03-01 MED ORDER — IBUPROFEN 600 MG PO TABS: 600.0000 mg | ORAL_TABLET | Freq: Four times a day (QID) | ORAL | 0 refills | Status: DC | PRN

## 2016-03-01 MED ORDER — LORATADINE-PSEUDOEPHEDRINE ER 5-120 MG PO TB12: 1.0000 | ORAL_TABLET | Freq: Two times a day (BID) | ORAL | 0 refills | Status: DC

## 2016-03-01 NOTE — Discharge Instructions (Signed)
Your examination is consistent with a viral infection. Please use the Decadron 2 times daily with food. Use the ibuprofen every 6 hours or with each meal and at bedtime for fever and aching. Please use Claritin-D 2 times daily for congestion. Use Cheratussin for cough. Cheratussin may cause drowsiness, please use this medication with caution. It is important to wash hands frequently. It is very important that you increase fluids. Please see your Medicaid access physician, or return to the emergency department if not improving.

## 2016-03-01 NOTE — ED Triage Notes (Signed)
Cough, congestion and vomiting times 3 weeks.

## 2016-03-01 NOTE — ED Provider Notes (Signed)
[facility] DEPT Provider Note   CSN: [id number] Arrival date & time: 03/01/16  1126     History   Chief Complaint Chief Complaint  Patient presents with  . Cough    HPI Crystal Hines is a 20 y.o. female.  Patient is a 20 year old female who presents to the emergency department with upper respiratory symptoms.  The patient states that she is been sick off and on for nearly 3 weeks, this problem has been worse within the last 3 days. She complains of congestion, cough, vomiting related to cough, body aches, fever, and generally not feeling well. The patient states she has not seen any blood in her mucus. She has body aches, that sometimes will not respond to Tylenol or ibuprofen. She request assistance with this issue. There's been no unusual rash.    Cough  Associated symptoms include chills and sore throat. Pertinent negatives include no wheezing.    Past Medical History:  Diagnosis Date  . Pregnant 08/23/2015    Patient Active Problem List   Diagnosis Date Noted  . Pregnant 08/23/2015    History reviewed. No pertinent surgical history.  OB History    Gravida Para Term Preterm AB Living   1             SAB TAB Ectopic Multiple Live Births                   Home Medications    Prior to Admission medications   Medication Sig Start Date End Date Taking? Authorizing Provider  ibuprofen (ADVIL,MOTRIN) 800 MG tablet Take 1 tablet (800 mg total) by mouth 3 (three) times daily. Patient not taking: Reported on 03/01/2016 09/26/15   Eber Hong, MD  prenatal vitamin w/FE, FA (PRENATAL 1 + 1) 27-1 MG TABS tablet Take 1 tablet by mouth daily at 12 noon. Patient not taking: Reported on 09/26/2015 08/23/15   Adline Potter, NP    Family History Family History  Problem Relation Age of Onset  . Hypertension Mother   . Hypertension Father   . Other Father     blood clots  . Hypertension Maternal Grandmother   . Hypertension Maternal Grandfather   . Congestive  Heart Failure Paternal Grandfather     Social History Social History  Substance Use Topics  . Smoking status: Former Smoker    Types: Cigarettes  . Smokeless tobacco: Never Used  . Alcohol use No     Allergies   Review of patient's allergies indicates no known allergies.   Review of Systems Review of Systems  Constitutional: Positive for activity change, appetite change, chills and fever.  HENT: Positive for congestion and sore throat.   Respiratory: Positive for cough. Negative for wheezing.   Skin: Negative for rash.     Physical Exam Updated Vital Signs BP 131/63 (BP Location: Right Arm)   Pulse 88   Temp 98.9 F (37.2 C) (Oral)   Resp 16   Ht 5\' 8"  (1.727 m)   Wt 86.2 kg   LMP 07/16/2015   SpO2 100%   BMI 28.89 kg/m   Physical Exam  Constitutional: She is oriented to person, place, and time. She appears well-developed and well-nourished.  Non-toxic appearance.  HENT:  Head: Normocephalic.  Right Ear: Tympanic membrane and external ear normal.  Left Ear: Tympanic membrane and external ear normal.  Eyes: EOM and lids are normal. Pupils are equal, round, and reactive to light.  Neck: Normal range of motion. Neck supple. Carotid bruit  is not present.  Cardiovascular: Normal rate, regular rhythm, normal heart sounds, intact distal pulses and normal pulses.   Pulmonary/Chest: Breath sounds normal. No respiratory distress.  Abdominal: Soft. Bowel sounds are normal. There is no tenderness. There is no guarding.  Musculoskeletal: Normal range of motion.  Lymphadenopathy:       Head (right side): No submandibular adenopathy present.       Head (left side): No submandibular adenopathy present.    She has no cervical adenopathy.  Neurological: She is alert and oriented to person, place, and time. She has normal strength. No cranial nerve deficit or sensory deficit.  Skin: Skin is warm and dry.  Psychiatric: She has a normal mood and affect. Her speech is normal.    Nursing note and vitals reviewed.    ED Treatments / Results  Labs (all labs ordered are listed, but only abnormal results are displayed) Labs Reviewed - No data to display  EKG  EKG Interpretation None       Radiology No results found.  Procedures Procedures (including critical care time)  Medications Ordered in ED Medications - No data to display   Initial Impression / Assessment and Plan / ED Course  I have reviewed the triage vital signs and the nursing notes.  Pertinent labs & imaging results that were available during my care of the patient were reviewed by me and considered in my medical decision making (see chart for details).  Clinical Course    *I have reviewed nursing notes, vital signs, and all appropriate lab and imaging results for this patient.**  Final Clinical Impressions(s) / ED Diagnoses  Vital signs are currently within normal limits. The pulse oximetry is 100% on room air.  The examination favors viral illness. I have advised patient to increase fluids, to use Tylenol every 4 hours, ibuprofen every 6 hours for aching and fever. A prescription for steroids, cough medication and decongesting medication will begin to the patient patient is to follow with the primary physician or return to the emergency department if any changes or problems.    Final diagnoses:  None    New Prescriptions New Prescriptions   No medications on file     Ivery QualeHobson Verginia Toohey, PA-C 03/01/16 1330    Shaune Pollackameron Isaacs, MD 03/02/16 2109

## 2016-05-07 ENCOUNTER — Encounter: Payer: Self-pay | Admitting: Adult Health

## 2016-05-07 ENCOUNTER — Ambulatory Visit (INDEPENDENT_AMBULATORY_CARE_PROVIDER_SITE_OTHER): Payer: Medicaid Other | Admitting: Adult Health

## 2016-05-07 VITALS — BP 140/50 | HR 98 | Ht 68.0 in | Wt 195.0 lb

## 2016-05-07 DIAGNOSIS — Z3490 Encounter for supervision of normal pregnancy, unspecified, unspecified trimester: Secondary | ICD-10-CM

## 2016-05-07 DIAGNOSIS — N926 Irregular menstruation, unspecified: Secondary | ICD-10-CM

## 2016-05-07 DIAGNOSIS — Z3201 Encounter for pregnancy test, result positive: Secondary | ICD-10-CM

## 2016-05-07 DIAGNOSIS — O3680X0 Pregnancy with inconclusive fetal viability, not applicable or unspecified: Secondary | ICD-10-CM

## 2016-05-07 LAB — POCT URINE PREGNANCY: PREG TEST UR: POSITIVE — AB

## 2016-05-07 MED ORDER — PRENATAL PLUS 27-1 MG PO TABS: 1.0000 | ORAL_TABLET | Freq: Every day | ORAL | 11 refills | Status: DC

## 2016-05-07 NOTE — Patient Instructions (Signed)
Second Trimester of Pregnancy The second trimester is from week 13 through week 28 (months 4 through 6). The second trimester is often a time when you feel your best. Your body has also adjusted to being pregnant, and you begin to feel better physically. Usually, morning sickness has lessened or quit completely, you may have more energy, and you may have an increase in appetite. The second trimester is also a time when the fetus is growing rapidly. At the end of the sixth month, the fetus is about 9 inches long and weighs about 1 pounds. You will likely begin to feel the baby move (quickening) between 18 and 20 weeks of the pregnancy. Body changes during your second trimester Your body continues to go through many changes during your second trimester. The changes vary from woman to woman.  Your weight will continue to increase. You will notice your lower abdomen bulging out.  You may begin to get stretch marks on your hips, abdomen, and breasts.  You may develop headaches that can be relieved by medicines. The medicines should be approved by your health care provider.  You may urinate more often because the fetus is pressing on your bladder.  You may develop or continue to have heartburn as a result of your pregnancy.  You may develop constipation because certain hormones are causing the muscles that push waste through your intestines to slow down.  You may develop hemorrhoids or swollen, bulging veins (varicose veins).  You may have back pain. This is caused by:  Weight gain.  Pregnancy hormones that are relaxing the joints in your pelvis.  A shift in weight and the muscles that support your balance.  Your breasts will continue to grow and they will continue to become tender.  Your gums may bleed and may be sensitive to brushing and flossing.  Dark spots or blotches (chloasma, mask of pregnancy) may develop on your face. This will likely fade after the baby is born.  A dark line  from your belly button to the pubic area (linea nigra) may appear. This will likely fade after the baby is born.  You may have changes in your hair. These can include thickening of your hair, rapid growth, and changes in texture. Some women also have hair loss during or after pregnancy, or hair that feels dry or thin. Your hair will most likely return to normal after your baby is born. What to expect at prenatal visits During a routine prenatal visit:  You will be weighed to make sure you and the fetus are growing normally.  Your blood pressure will be taken.  Your abdomen will be measured to track your baby's growth.  The fetal heartbeat will be listened to.  Any test results from the previous visit will be discussed. Your health care provider may ask you:  How you are feeling.  If you are feeling the baby move.  If you have had any abnormal symptoms, such as leaking fluid, bleeding, severe headaches, or abdominal cramping.  If you are using any tobacco products, including cigarettes, chewing tobacco, and electronic cigarettes.  If you have any questions. Other tests that may be performed during your second trimester include:  Blood tests that check for:  Low iron levels (anemia).  Gestational diabetes (between 24 and 28 weeks).  Rh antibodies. This is to check for a protein on red blood cells (Rh factor).  Urine tests to check for infections, diabetes, or protein in the urine.  An ultrasound to   confirm the proper growth and development of the baby.  An amniocentesis to check for possible genetic problems.  Fetal screens for spina bifida and Down syndrome.  HIV (human immunodeficiency virus) testing. Routine prenatal testing includes screening for HIV, unless you choose not to have this test. Follow these instructions at home: Eating and drinking  Continue to eat regular, healthy meals.  Avoid raw meat, uncooked cheese, cat litter boxes, and soil used by cats. These  carry germs that can cause birth defects in the baby.  Take your prenatal vitamins.  Take 1500-2000 mg of calcium daily starting at the 20th week of pregnancy until you deliver your baby.  If you develop constipation:  Take over-the-counter or prescription medicines.  Drink enough fluid to keep your urine clear or pale yellow.  Eat foods that are high in fiber, such as fresh fruits and vegetables, whole grains, and beans.  Limit foods that are high in fat and processed sugars, such as fried and sweet foods. Activity  Exercise only as directed by your health care provider. Experiencing uterine cramps is a good sign to stop exercising.  Avoid heavy lifting, wear low heel shoes, and practice good posture.  Wear your seat belt at all times when driving.  Rest with your legs elevated if you have leg cramps or low back pain.  Wear a good support bra for breast tenderness.  Do not use hot tubs, steam rooms, or saunas. Lifestyle  Avoid all smoking, herbs, alcohol, and unprescribed drugs. These chemicals affect the formation and growth of the baby.  Do not use any products that contain nicotine or tobacco, such as cigarettes and e-cigarettes. If you need help quitting, ask your health care provider.  A sexual relationship may be continued unless your health care provider directs you otherwise. General instructions  Follow your health care provider's instructions regarding medicine use. There are medicines that are either safe or unsafe to take during pregnancy.  Take warm sitz baths to soothe any pain or discomfort caused by hemorrhoids. Use hemorrhoid cream if your health care provider approves.  If you develop varicose veins, wear support hose. Elevate your feet for 15 minutes, 3-4 times a day. Limit salt in your diet.  Visit your dentist if you have not gone yet during your pregnancy. Use a soft toothbrush to brush your teeth and be gentle when you floss.  Keep all follow-up  prenatal visits as told by your health care provider. This is important. Contact a health care provider if:  You have dizziness.  You have mild pelvic cramps, pelvic pressure, or nagging pain in the abdominal area.  You have persistent nausea, vomiting, or diarrhea.  You have a bad smelling vaginal discharge.  You have pain with urination. Get help right away if:  You have a fever.  You are leaking fluid from your vagina.  You have spotting or bleeding from your vagina.  You have severe abdominal cramping or pain.  You have rapid weight gain or weight loss.  You have shortness of breath with chest pain.  You notice sudden or extreme swelling of your face, hands, ankles, feet, or legs.  You have not felt your baby move in over an hour.  You have severe headaches that do not go away with medicine.  You have vision changes. Summary  The second trimester is from week 13 through week 28 (months 4 through 6). It is also a time when the fetus is growing rapidly.  Your body goes   through many changes during pregnancy. The changes vary from woman to woman.  Avoid all smoking, herbs, alcohol, and unprescribed drugs. These chemicals affect the formation and growth your baby.  Do not use any tobacco products, such as cigarettes, chewing tobacco, and e-cigarettes. If you need help quitting, ask your health care provider.  Contact your health care provider if you have any questions. Keep all prenatal visits as told by your health care provider. This is important. This information is not intended to replace advice given to you by your health care provider. Make sure you discuss any questions you have with your health care provider. Document Released: 05/22/2001 Document Revised: 11/03/2015 Document Reviewed: 07/29/2012 [employer] Interactive Patient Education  2017 [employer]  First Trimester of Pregnancy The first trimester of pregnancy is from week 1 until the end of week 12  (months 1 through 3). A week after a sperm fertilizes an egg, the egg will implant on the wall of the uterus. This embryo will begin to develop into a baby. Genes from you and your partner are forming the baby. The female genes determine whether the baby is a boy or a girl. At 6-8 weeks, the eyes and face are formed, and the heartbeat can be seen on ultrasound. At the end of 12 weeks, all the baby's organs are formed.  Now that you are pregnant, you will want to do everything you can to have a healthy baby. Two of the most important things are to get good prenatal care and to follow your health care provider's instructions. Prenatal care is all the medical care you receive before the baby's birth. This care will help prevent, find, and treat any problems during the pregnancy and childbirth. BODY CHANGES Your body goes through many changes during pregnancy. The changes vary from woman to woman.   You may gain or lose a couple of pounds at first.  You may feel sick to your stomach (nauseous) and throw up (vomit). If the vomiting is uncontrollable, call your health care provider.  You may tire easily.  You may develop headaches that can be relieved by medicines approved by your health care provider.  You may urinate more often. Painful urination may mean you have a bladder infection.  You may develop heartburn as a result of your pregnancy.  You may develop constipation because certain hormones are causing the muscles that push waste through your intestines to slow down.  You may develop hemorrhoids or swollen, bulging veins (varicose veins).  Your breasts may begin to grow larger and become tender. Your nipples may stick out more, and the tissue that surrounds them (areola) may become darker.  Your gums may bleed and may be sensitive to brushing and flossing.  Dark spots or blotches (chloasma, mask of pregnancy) may develop on your face. This will likely fade after the baby is born.  Your  menstrual periods will stop.  You may have a loss of appetite.  You may develop cravings for certain kinds of food.  You may have changes in your emotions from day to day, such as being excited to be pregnant or being concerned that something may go wrong with the pregnancy and baby.  You may have more vivid and strange dreams.  You may have changes in your hair. These can include thickening of your hair, rapid growth, and changes in texture. Some women also have hair loss during or after pregnancy, or hair that feels dry or thin. Your hair will most   return to normal after your baby is born. WHAT TO EXPECT AT YOUR PRENATAL VISITS During a routine prenatal visit:  You will be weighed to make sure you and the baby are growing normally.  Your blood pressure will be taken.  Your abdomen will be measured to track your baby's growth.  The fetal heartbeat will be listened to starting around week 10 or 12 of your pregnancy.  Test results from any previous visits will be discussed. Your health care provider may ask you:  How you are feeling.  If you are feeling the baby move.  If you have had any abnormal symptoms, such as leaking fluid, bleeding, severe headaches, or abdominal cramping.  If you are using any tobacco products, including cigarettes, chewing tobacco, and electronic cigarettes.  If you have any questions. Other tests that may be performed during your first trimester include:  Blood tests to find your blood type and to check for the presence of any previous infections. They will also be used to check for low iron levels (anemia) and Rh antibodies. Later in the pregnancy, blood tests for diabetes will be done along with other tests if problems develop.  Urine tests to check for infections, diabetes, or protein in the urine.  An ultrasound to confirm the proper growth and development of the baby.  An amniocentesis to check for possible genetic problems.  Fetal  screens for spina bifida and Down syndrome.  You may need other tests to make sure you and the baby are doing well.  HIV (human immunodeficiency virus) testing. Routine prenatal testing includes screening for HIV, unless you choose not to have this test. HOME CARE INSTRUCTIONS  Medicines   Follow your health care provider's instructions regarding medicine use. Specific medicines may be either safe or unsafe to take during pregnancy.  Take your prenatal vitamins as directed.  If you develop constipation, try taking a stool softener if your health care provider approves. Diet   Eat regular, well-balanced meals. Choose a variety of foods, such as meat or vegetable-based protein, fish, milk and low-fat dairy products, vegetables, fruits, and whole grain breads and cereals. Your health care provider will help you determine the amount of weight gain that is right for you.  Avoid raw meat and uncooked cheese. These carry germs that can cause birth defects in the baby.  Eating four or five small meals rather than three large meals a day may help relieve nausea and vomiting. If you start to feel nauseous, eating a few soda crackers can be helpful. Drinking liquids between meals instead of during meals also seems to help nausea and vomiting.  If you develop constipation, eat more high-fiber foods, such as fresh vegetables or fruit and whole grains. Drink enough fluids to keep your urine clear or pale yellow. Activity and Exercise   Exercise only as directed by your health care provider. Exercising will help you:  Control your weight.  Stay in shape.  Be prepared for labor and delivery.  Experiencing pain or cramping in the lower abdomen or low back is a good sign that you should stop exercising. Check with your health care provider before continuing normal exercises.  Try to avoid standing for long periods of time. Move your legs often if you must stand in one place for a long time.  Avoid  heavy lifting.  Wear low-heeled shoes, and practice good posture.  You may continue to have sex unless your health care provider directs you otherwise. Relief of Pain  Pain or Discomfort   Wear a good support bra for breast tenderness.   Take warm sitz baths to soothe any pain or discomfort caused by hemorrhoids. Use hemorrhoid cream if your health care provider approves.   Rest with your legs elevated if you have leg cramps or low back pain.  If you develop varicose veins in your legs, wear support hose. Elevate your feet for 15 minutes, 3-4 times a day. Limit salt in your diet. Prenatal Care   Schedule your prenatal visits by the twelfth week of pregnancy. They are usually scheduled monthly at first, then more often in the last 2 months before delivery.  Write down your questions. Take them to your prenatal visits.  Keep all your prenatal visits as directed by your health care provider. Safety   Wear your seat belt at all times when driving.  Make a list of emergency phone numbers, including numbers for family, friends, the hospital, and police and fire departments. General Tips   Ask your health care provider for a referral to a local prenatal education class. Begin classes no later than at the beginning of month 6 of your pregnancy.  Ask for help if you have counseling or nutritional needs during pregnancy. Your health care provider can offer advice or refer you to specialists for help with various needs.  Do not use hot tubs, steam rooms, or saunas.  Do not douche or use tampons or scented sanitary pads.  Do not cross your legs for long periods of time.  Avoid cat litter boxes and soil used by cats. These carry germs that can cause birth defects in the baby and possibly loss of the fetus by miscarriage or stillbirth.  Avoid all smoking, herbs, alcohol, and medicines not prescribed by your health care provider. Chemicals in these affect the formation and growth of the  baby.  Do not use any tobacco products, including cigarettes, chewing tobacco, and electronic cigarettes. If you need help quitting, ask your health care provider. You may receive counseling support and other resources to help you quit.  Schedule a dentist appointment. At home, brush your teeth with a soft toothbrush and be gentle when you floss. SEEK MEDICAL CARE IF:   You have dizziness.  You have mild pelvic cramps, pelvic pressure, or nagging pain in the abdominal area.  You have persistent nausea, vomiting, or diarrhea.  You have a bad smelling vaginal discharge.  You have pain with urination.  You notice increased swelling in your face, hands, legs, or ankles. SEEK IMMEDIATE MEDICAL CARE IF:   You have a fever.  You are leaking fluid from your vagina.  You have spotting or bleeding from your vagina.  You have severe abdominal cramping or pain.  You have rapid weight gain or loss.  You vomit blood or material that looks like coffee grounds.  You are exposed to German measles and have never had them.  You are exposed to fifth disease or chickenpox.  You develop a severe headache.  You have shortness of breath.  You have any kind of trauma, such as from a fall or a car accident. This information is not intended to replace advice given to you by your health care provider. Make sure you discuss any questions you have with your health care provider. Document Released: 05/22/2001 Document Revised: 06/18/2014 Document Reviewed: 04/07/2013 Else[employer] Interactive Patient Education  2017 [employer]  

## 2016-05-07 NOTE — Progress Notes (Signed)
Subjective:     Patient ID: Crystal Hines, female   DOB: 1995-08-06, 20 y.o.   MRN: [id number]  HPI Crystal Hines is a 20 year old black female in for UPT, has missed several periods.   Review of Systems  +missed period, denies any bleeding or nausea Reviewed past medical,surgical, social and family history. Reviewed medications and allergies.     Objective:   Physical Exam BP (!) 140/50 (BP Location: Left Arm, Patient Position: Sitting, Cuff Size: Normal)   Pulse 98   Ht 5\' 8"  (1.727 m)   Wt 195 lb (88.5 kg)   LMP 02/06/2016 (Approximate)   Breastfeeding? No   BMI 29.65 kg/m UPT +, about 13 weeks by LMP with EDD 11/12/16. Skin warm and dry. Neck: mid line trachea, normal thyroid, good ROM, no lymphadenopathy noted. Lungs: clear to ausculation bilaterally. Cardiovascular: regular rate and rhythm.Abdomen is soft and non tender, PHQ 2 score 0. Will get back in ASAP for dating US.    Assessment:     1. Pregnancy examination or test, positive result   2. Pregnancy, unspecified gestational age   323. Encounter to determine fetal viability of pregnancy, single or unspecified fetus       Plan:     Meds ordered this encounter  Medications  . prenatal vitamin w/FE, FA (PRENATAL 1 + 1) 27-1 MG TABS tablet    Sig: Take 1 tablet by mouth daily at 12 noon.    Dispense:  30 each    Refill:  11    Order Specific Question:   Supervising Provider    Answer:   Crystal Hines, Crystal Hines [[id number]]  Return in 1 day for dating US Review handouts on first and second trimester

## 2016-05-08 ENCOUNTER — Ambulatory Visit (INDEPENDENT_AMBULATORY_CARE_PROVIDER_SITE_OTHER): Payer: Medicaid Other

## 2016-05-08 DIAGNOSIS — O3680X0 Pregnancy with inconclusive fetal viability, not applicable or unspecified: Secondary | ICD-10-CM | POA: Diagnosis not present

## 2016-05-08 DIAGNOSIS — Z3A10 10 weeks gestation of pregnancy: Secondary | ICD-10-CM | POA: Diagnosis not present

## 2016-05-08 NOTE — Progress Notes (Signed)
US 9+5 wks,single IUP w/ys,pos fht 168 bpm,normal ov's bilat,crl 29.1 mm

## 2016-05-18 ENCOUNTER — Encounter: Payer: Self-pay | Admitting: Women's Health

## 2016-05-18 ENCOUNTER — Other Ambulatory Visit (HOSPITAL_COMMUNITY)
Admission: RE | Admit: 2016-05-18 | Discharge: 2016-05-18 | Disposition: A | Discharge status: Home / Self Care | Payer: Medicaid Other | Source: Ambulatory Visit | Attending: Obstetrics & Gynecology | Admitting: Obstetrics & Gynecology

## 2016-05-18 ENCOUNTER — Ambulatory Visit (INDEPENDENT_AMBULATORY_CARE_PROVIDER_SITE_OTHER): Payer: Medicaid Other | Admitting: Women's Health

## 2016-05-18 VITALS — BP 155/80 | HR 99 | Wt 192.0 lb

## 2016-05-18 DIAGNOSIS — Z124 Encounter for screening for malignant neoplasm of cervix: Secondary | ICD-10-CM

## 2016-05-18 DIAGNOSIS — O0990 Supervision of high risk pregnancy, unspecified, unspecified trimester: Secondary | ICD-10-CM | POA: Insufficient documentation

## 2016-05-18 DIAGNOSIS — Z1151 Encounter for screening for human papillomavirus (HPV): Secondary | ICD-10-CM | POA: Diagnosis present

## 2016-05-18 DIAGNOSIS — Z3A12 12 weeks gestation of pregnancy: Secondary | ICD-10-CM | POA: Diagnosis not present

## 2016-05-18 DIAGNOSIS — Z331 Pregnant state, incidental: Secondary | ICD-10-CM

## 2016-05-18 DIAGNOSIS — N898 Other specified noninflammatory disorders of vagina: Secondary | ICD-10-CM | POA: Diagnosis not present

## 2016-05-18 DIAGNOSIS — Z01411 Encounter for gynecological examination (general) (routine) with abnormal findings: Secondary | ICD-10-CM | POA: Diagnosis not present

## 2016-05-18 DIAGNOSIS — Z1389 Encounter for screening for other disorder: Secondary | ICD-10-CM

## 2016-05-18 DIAGNOSIS — O26891 Other specified pregnancy related conditions, first trimester: Secondary | ICD-10-CM | POA: Diagnosis not present

## 2016-05-18 DIAGNOSIS — A599 Trichomoniasis, unspecified: Secondary | ICD-10-CM | POA: Diagnosis not present

## 2016-05-18 DIAGNOSIS — Z3481 Encounter for supervision of other normal pregnancy, first trimester: Secondary | ICD-10-CM

## 2016-05-18 DIAGNOSIS — Z113 Encounter for screening for infections with a predominantly sexual mode of transmission: Secondary | ICD-10-CM | POA: Diagnosis present

## 2016-05-18 LAB — POCT WET PREP (WET MOUNT): CLUE CELLS WET PREP WHIFF POC: POSITIVE

## 2016-05-18 LAB — POCT URINALYSIS DIPSTICK
GLUCOSE UA: NEGATIVE
Ketones, UA: NEGATIVE
Nitrite, UA: NEGATIVE
RBC UA: NEGATIVE

## 2016-05-18 MED ORDER — METRONIDAZOLE 500 MG PO TABS: 500.0000 mg | ORAL_TABLET | Freq: Two times a day (BID) | ORAL | 0 refills | Status: DC

## 2016-05-18 NOTE — Patient Instructions (Signed)
Name, birthday, allergies, pharmacy- call [place] Monday with this information Do not have sex again until it's been at least 7 days from the time he has finished his medicine, we will recheck you at your next visit to make sure it's gone  Trichomoniasis Trichomoniasis is an infection caused by an organism called Trichomonas. The infection can affect both women and men. In women, the outer female genitalia and the vagina are affected. In men, the penis is mainly affected, but the prostate and other reproductive organs can also be involved. Trichomoniasis is a sexually transmitted infection (STI) and is most often passed to another person through sexual contact.  RISK FACTORS  Having unprotected sexual intercourse.  Having sexual intercourse with an infected partner. SIGNS AND SYMPTOMS  Symptoms of trichomoniasis in women include:  Abnormal gray-green frothy vaginal discharge.  Itching and irritation of the vagina.  Itching and irritation of the area outside the vagina. Symptoms of trichomoniasis in men include:   Penile discharge with or without pain.  Pain during urination. This results from inflammation of the urethra. DIAGNOSIS  Trichomoniasis may be found during a Pap test or physical exam. Your health care provider may use one of the following methods to help diagnose this infection:  Testing the pH of the vagina with a test tape.  Using a vaginal swab test that checks for the Trichomonas organism. A test is available that provides results within a few minutes.  Examining a urine sample.  Testing vaginal secretions. Your health care provider may test you for other STIs, including HIV. TREATMENT   You may be given medicine to fight the infection. Women should inform their health care provider if they could be or are pregnant. Some medicines used to treat the infection should not be taken during pregnancy.  Your health care provider may recommend over-the-counter medicines or  creams to decrease itching or irritation.  Your sexual partner will need to be treated if infected.  Your health care provider may test you for infection again 3 months after treatment. HOME CARE INSTRUCTIONS   Take medicines only as directed by your health care provider.  Take over-the-counter medicine for itching or irritation as directed by your health care provider.  Do not have sexual intercourse while you have the infection.  Women should not douche or wear tampons while they have the infection.  Discuss your infection with your partner. Your partner may have gotten the infection from you, or you may have gotten it from your partner.  Have your sex partner get examined and treated if necessary.  Practice safe, informed, and protected sex.  See your health care provider for other STI testing. SEEK MEDICAL CARE IF:   You still have symptoms after you finish your medicine.  You develop abdominal pain.  You have pain when you urinate.  You have bleeding after sexual intercourse.  You develop a rash.  Your medicine makes you sick or makes you throw up (vomit). MAKE SURE YOU:  Understand these instructions.  Will watch your condition.  Will get help right away if you are not doing well or get worse. This information is not intended to replace advice given to you by your health care provider. Make sure you discuss any questions you have with your health care provider. Document Released: 11/21/2000 Document Revised: 06/18/2014 Document Reviewed: 03/09/2013 Elsevier Interactive Patient Education  2017 [employer]   Nausea & Vomiting  Have saltine crackers or pretzels by your bed and eat a few bites  before you raise your head out of bed in the morning  Eat small frequent meals throughout the day instead of large meals  Drink plenty of fluids throughout the day to stay hydrated, just don't drink a lot of fluids with your meals.  This can make your stomach fill up  faster making you feel sick  Do not brush your teeth right after you eat  Products with real ginger are good for nausea, like ginger ale and ginger hard candy Make sure it says made with real ginger!  Sucking on sour candy like lemon heads is also good for nausea  If your prenatal vitamins make you nauseated, take them at night so you will sleep through the nausea  Sea Bands  If you feel like you need medicine for the nausea & vomiting please let us know  If you are unable to keep any fluids or food down please let us know   Constipation  Drink plenty of fluid, preferably water, throughout the day  Eat foods high in fiber such as fruits, vegetables, and grains  Exercise, such as walking, is a good way to keep your bowels regular  Drink warm fluids, especially warm prune juice, or decaf coffee  Eat a 1/2 cup of real oatmeal (not instant), 1/2 cup applesauce, and 1/2-1 cup warm prune juice every day  If needed, you may take Colace (docusate sodium) stool softener once or twice a day to help keep the stool soft. If you are pregnant, wait until you are out of your first trimester (12-14 weeks of pregnancy)  If you still are having problems with constipation, you may take Miralax once daily as needed to help keep your bowels regular.  If you are pregnant, wait until you are out of your first trimester (12-14 weeks of pregnancy)   First Trimester of Pregnancy The first trimester of pregnancy is from week 1 until the end of week 12 (months 1 through 3). A week after a sperm fertilizes an egg, the egg will implant on the wall of the uterus. This embryo will begin to develop into a baby. Genes from you and your partner are forming the baby. The female genes determine whether the baby is a boy or a girl. At 6-8 weeks, the eyes and face are formed, and the heartbeat can be seen on ultrasound. At the end of 12 weeks, all the baby's organs are formed.  Now that you are pregnant, you will want to  do everything you can to have a healthy baby. Two of the most important things are to get good prenatal care and to follow your health care provider's instructions. Prenatal care is all the medical care you receive before the baby's birth. This care will help prevent, find, and treat any problems during the pregnancy and childbirth. BODY CHANGES Your body goes through many changes during pregnancy. The changes vary from woman to woman.   You may gain or lose a couple of pounds at first.  You may feel sick to your stomach (nauseous) and throw up (vomit). If the vomiting is uncontrollable, call your health care provider.  You may tire easily.  You may develop headaches that can be relieved by medicines approved by your health care provider.  You may urinate more often. Painful urination may mean you have a bladder infection.  You may develop heartburn as a result of your pregnancy.  You may develop constipation because certain hormones are causing the muscles that push waste through  your intestines to slow down.  You may develop hemorrhoids or swollen, bulging veins (varicose veins).  Your breasts may begin to grow larger and become tender. Your nipples may stick out more, and the tissue that surrounds them (areola) may become darker.  Your gums may bleed and may be sensitive to brushing and flossing.  Dark spots or blotches (chloasma, mask of pregnancy) may develop on your face. This will likely fade after the baby is born.  Your menstrual periods will stop.  You may have a loss of appetite.  You may develop cravings for certain kinds of food.  You may have changes in your emotions from day to day, such as being excited to be pregnant or being concerned that something may go wrong with the pregnancy and baby.  You may have more vivid and strange dreams.  You may have changes in your hair. These can include thickening of your hair, rapid growth, and changes in texture. Some women  also have hair loss during or after pregnancy, or hair that feels dry or thin. Your hair will most likely return to normal after your baby is born. WHAT TO EXPECT AT YOUR PRENATAL VISITS During a routine prenatal visit:  You will be weighed to make sure you and the baby are growing normally.  Your blood pressure will be taken.  Your abdomen will be measured to track your baby's growth.  The fetal heartbeat will be listened to starting around week 10 or 12 of your pregnancy.  Test results from any previous visits will be discussed. Your health care provider may ask you:  How you are feeling.  If you are feeling the baby move.  If you have had any abnormal symptoms, such as leaking fluid, bleeding, severe headaches, or abdominal cramping.  If you have any questions. Other tests that may be performed during your first trimester include:  Blood tests to find your blood type and to check for the presence of any previous infections. They will also be used to check for low iron levels (anemia) and Rh antibodies. Later in the pregnancy, blood tests for diabetes will be done along with other tests if problems develop.  Urine tests to check for infections, diabetes, or protein in the urine.  An ultrasound to confirm the proper growth and development of the baby.  An amniocentesis to check for possible genetic problems.  Fetal screens for spina bifida and Down syndrome.  You may need other tests to make sure you and the baby are doing well. HOME CARE INSTRUCTIONS  Medicines  Follow your health care provider's instructions regarding medicine use. Specific medicines may be either safe or unsafe to take during pregnancy.  Take your prenatal vitamins as directed.  If you develop constipation, try taking a stool softener if your health care provider approves. Diet  Eat regular, well-balanced meals. Choose a variety of foods, such as meat or vegetable-based protein, fish, milk and low-fat  dairy products, vegetables, fruits, and whole grain breads and cereals. Your health care provider will help you determine the amount of weight gain that is right for you.  Avoid raw meat and uncooked cheese. These carry germs that can cause birth defects in the baby.  Eating four or five small meals rather than three large meals a day may help relieve nausea and vomiting. If you start to feel nauseous, eating a few soda crackers can be helpful. Drinking liquids between meals instead of during meals also seems to help nausea and  vomiting.  If you develop constipation, eat more high-fiber foods, such as fresh vegetables or fruit and whole grains. Drink enough fluids to keep your urine clear or pale yellow. Activity and Exercise  Exercise only as directed by your health care provider. Exercising will help you:  Control your weight.  Stay in shape.  Be prepared for labor and delivery.  Experiencing pain or cramping in the lower abdomen or low back is a good sign that you should stop exercising. Check with your health care provider before continuing normal exercises.  Try to avoid standing for long periods of time. Move your legs often if you must stand in one place for a long time.  Avoid heavy lifting.  Wear low-heeled shoes, and practice good posture.  You may continue to have sex unless your health care provider directs you otherwise. Relief of Pain or Discomfort  Wear a good support bra for breast tenderness.   Take warm sitz baths to soothe any pain or discomfort caused by hemorrhoids. Use hemorrhoid cream if your health care provider approves.   Rest with your legs elevated if you have leg cramps or low back pain.  If you develop varicose veins in your legs, wear support hose. Elevate your feet for 15 minutes, 3-4 times a day. Limit salt in your diet. Prenatal Care  Schedule your prenatal visits by the twelfth week of pregnancy. They are usually scheduled monthly at first,  then more often in the last 2 months before delivery.  Write down your questions. Take them to your prenatal visits.  Keep all your prenatal visits as directed by your health care provider. Safety  Wear your seat belt at all times when driving.  Make a list of emergency phone numbers, including numbers for family, friends, the hospital, and police and fire departments. General Tips  Ask your health care provider for a referral to a local prenatal education class. Begin classes no later than at the beginning of month 6 of your pregnancy.  Ask for help if you have counseling or nutritional needs during pregnancy. Your health care provider can offer advice or refer you to specialists for help with various needs.  Do not use hot tubs, steam rooms, or saunas.  Do not douche or use tampons or scented sanitary pads.  Do not cross your legs for long periods of time.  Avoid cat litter boxes and soil used by cats. These carry germs that can cause birth defects in the baby and possibly loss of the fetus by miscarriage or stillbirth.  Avoid all smoking, herbs, alcohol, and medicines not prescribed by your health care provider. Chemicals in these affect the formation and growth of the baby.  Schedule a dentist appointment. At home, brush your teeth with a soft toothbrush and be gentle when you floss. SEEK MEDICAL CARE IF:   You have dizziness.  You have mild pelvic cramps, pelvic pressure, or nagging pain in the abdominal area.  You have persistent nausea, vomiting, or diarrhea.  You have a bad smelling vaginal discharge.  You have pain with urination.  You notice increased swelling in your face, hands, legs, or ankles. SEEK IMMEDIATE MEDICAL CARE IF:   You have a fever.  You are leaking fluid from your vagina.  You have spotting or bleeding from your vagina.  You have severe abdominal cramping or pain.  You have rapid weight gain or loss.  You vomit blood or material that  looks like coffee grounds.  You are exposed  to [place] measles and have never had them.  You are exposed to fifth disease or chickenpox.  You develop a severe headache.  You have shortness of breath.  You have any kind of trauma, such as from a fall or a car accident. Document Released: 05/22/2001 Document Revised: 10/12/2013 Document Reviewed: 04/07/2013 [facility] Patient Information 2015 [place], [place]. This information is not intended to replace advice given to you by your health care provider. Make sure you discuss any questions you have with your health care provider.

## 2016-05-18 NOTE — Progress Notes (Addendum)
Subjective:  Crystal Hines is a 20 y.o. G2P0010 African American female at 6078w1d by 9wk u/s, being seen today for her first obstetrical visit.  Her obstetrical history is significant for SAB x 1, quit smoking w/ +PT, THC prior to +PT.  Pregnancy history fully reviewed.   Patient reports vaginal odor/itching x 1wk. Denies vb, cramping, uti s/s, abnormal/malodorous vag d/c, or vulvovaginal itching/irritation.  BP (!) 130/40   Pulse 99   Wt 192 lb (87.1 kg)   LMP 02/06/2016 (Approximate)   BMI 29.19 kg/m   Initial DBP was low, so RN rechecked- then 155/80 Pt states BP goes up and down all the time, never dx w/ HTN or any meds  HISTORY: OB History  Gravida Para Term Preterm AB Living  2       1    SAB TAB Ectopic Multiple Live Births  1            # Outcome Date GA Lbr Len/2nd Weight Sex Delivery Anes PTL Lv  2 Current           1 SAB              Past Medical History:  Diagnosis Date  . Pregnant 08/23/2015   History reviewed. No pertinent surgical history. Family History  Problem Relation Age of Onset  . Hypertension Mother   . Hypertension Father   . Other Father     blood clots  . Hypertension Maternal Grandmother   . Hypertension Maternal Grandfather   . Congestive Heart Failure Paternal Grandfather     Exam   System:     General: Well developed & nourished, no acute distress   Skin: Warm & dry, normal coloration and turgor, no rashes   Neurologic: Alert & oriented, normal mood   Cardiovascular: Regular rate & rhythm   Respiratory: Effort & rate normal, LCTAB, acyanotic   Abdomen: Soft, non tender   Extremities: normal strength, tone   Pelvic Exam:    Perineum: Normal perineum   Vulva: Normal, no lesions   Vagina:  Normal mucosa, thin yellow malodorous d/c   Cervix: Normal, bulbous, appears closed   Uterus: Normal size/shape/contour for GA   Results for orders placed or performed in visit on 05/18/16 (from the past 24 hour(s))  POCT urinalysis dipstick      Status: Abnormal   Collection Time: 05/18/16 11:47 AM  Result Value Ref Range   Color, UA yellow    Clarity, UA clear    Glucose, UA neg    Bilirubin, UA     Ketones, UA neg    Spec Grav, UA     Blood, UA neg    pH, UA     Protein, UA trace    Urobilinogen, UA     Nitrite, UA neg    Leukocytes, UA moderate (2+) (A) Negative  POCT Wet Prep Mellody Drown(Wet [place])     Status: Abnormal   Collection Time: 05/18/16  1:11 PM  Result Value Ref Range   Source Wet Prep POC vaginal    WBC, Wet Prep HPF POC mod    Bacteria Wet Prep HPF POC None (A) Few   BACTERIA WET PREP MORPHOLOGY POC     Clue Cells Wet Prep HPF POC None None   Clue Cells Wet Prep Whiff POC Positive Whiff    Yeast Wet Prep HPF POC None    KOH Wet Prep POC     Trichomonas Wet Prep HPF POC Present (A) Absent  Thin prep pap smear obtained w/ reflex high risk HPV cotesting FHR: 160 via doppler   Assessment:   Pregnancy: G2P0010 Patient Active Problem List   Diagnosis Date Noted  . Pregnancy examination or test, positive result 05/07/2016  . Encounter to determine fetal viability of pregnancy 05/07/2016  . Pregnant 08/23/2015    1188w1d G2P0010 New OB visit +Trichomonas 1 elevated BP THC use prior to +PT Prev smoker, quit w/ +PT  Plan:  Initial labs drawn Continue prenatal vitamins Problem list reviewed and updated Reviewed n/v relief measures and warning s/s to report Reviewed recommended weight gain based on pre-gravid BMI Encouraged well-balanced diet Genetic Screening discussed Integrated Screen: requested Cystic fibrosis screening discussed declines today, wants to check if [name] has insurance- will let us know at next visit Ultrasound discussed; fetal survey: requested Follow up in 1 weeks for 1st nt/it (no visit) then 4wks for visit and 2nd IT and trich poc To call us Monday AM w/ partner's name/dob/allergies/pharmacy so we can treat for trich Rx metronidazole 500mg  BID x 7d for trich for pt- no sex until  at least 7d from time fob has completed his medicine CCNC completed  Marge DuncansBooker, Phelix Fudala Randall CNM, [facility] Center[facility]NP-BC 05/18/2016 1:05 PM

## 2016-05-20 LAB — URINE CULTURE

## 2016-05-22 ENCOUNTER — Other Ambulatory Visit: Payer: Self-pay | Admitting: Women's Health

## 2016-05-22 DIAGNOSIS — Z3682 Encounter for antenatal screening for nuchal translucency: Secondary | ICD-10-CM

## 2016-05-23 ENCOUNTER — Other Ambulatory Visit: Payer: Medicaid Other

## 2016-05-23 ENCOUNTER — Ambulatory Visit (INDEPENDENT_AMBULATORY_CARE_PROVIDER_SITE_OTHER): Payer: Medicaid Other

## 2016-05-23 DIAGNOSIS — Z3682 Encounter for antenatal screening for nuchal translucency: Secondary | ICD-10-CM

## 2016-05-23 DIAGNOSIS — Z3A11 11 weeks gestation of pregnancy: Secondary | ICD-10-CM

## 2016-05-23 DIAGNOSIS — Z3401 Encounter for supervision of normal first pregnancy, first trimester: Secondary | ICD-10-CM

## 2016-05-23 NOTE — Progress Notes (Signed)
US 11+6 wks,measurements c/w dates,normal ov's bilat,crl 56.2 mm,fhr 162 bpm,NB present,NT 2.4 mm

## 2016-05-24 LAB — CYTOLOGY - PAP
CHLAMYDIA, DNA PROBE: NEGATIVE
DIAGNOSIS: UNDETERMINED — AB
HPV: DETECTED — AB
NEISSERIA GONORRHEA: NEGATIVE

## 2016-05-25 ENCOUNTER — Telehealth: Payer: Self-pay | Admitting: *Deleted

## 2016-05-25 NOTE — Telephone Encounter (Signed)
Pt informed of abnormal pap and need for colpo, call transferred to front staff for appt to be changed to MD's schedule.

## 2016-05-26 LAB — MATERNAL SCREEN, INTEGRATED #1
CROWN RUMP LENGTH MAT SCREEN: 56.2 mm
GEST. AGE ON COLLECTION DATE: 12.1 wk
Maternal Age at EDD: 22 years
Nuchal Translucency (NT): 2.4 mm
Number of Fetuses: 1
PAPP-A Value: 756.5 ng/mL
WEIGHT: 191 [lb_av]

## 2016-05-28 ENCOUNTER — Encounter: Payer: Self-pay | Admitting: Women's Health

## 2016-05-28 DIAGNOSIS — R87619 Unspecified abnormal cytological findings in specimens from cervix uteri: Secondary | ICD-10-CM | POA: Insufficient documentation

## 2016-06-11 NOTE — L&D Delivery Note (Signed)
21 y.o. G2P0010 at 2342w2d delivered a viable female infant in cephalic, LOA position. Anterior shoulder delivered with ease. 60 sec delayed cord clamping. Cord clamped x2 and cut. Placenta delivered spontaneously intact, with 3VC. Fundus firm on exam with massage and pitocin. Good hemostasis noted.  Anesthesia: Epidural Laceration: left labial laceration Suture: 3.0 monocryl Good hemostasis noted. EBL: 200 cc  Mom and baby recovering in LDR.    Apgars: APGAR (1 MIN): 9   APGAR (5 MINS): 9   APGAR (10 MINS):   Weight: Pending skin to skin  Sponge and instrument count were correct x2. Placenta sent to L&D  Howard PouchLauren Feng, MD PGY-1 Family Medicine 12/01/2016, 6:39 AM   Patient is a G2P0010 at 2942w2d who was admitted for IOL due to gHTN, but otherwise uncomplicated prenatal course.  She progressed with augmentation via cytotec, foley, and Pitocin.  I was gloved and present for delivery in its entirety.  Second stage of labor progressed, baby delivered after a few contractions.  No decels during second stage noted.  Complications: none  Lacerations: L labial; 3 interrupted sutures  EBL: 200cc  SHAW, KIMBERLY, CNM 9:15 AM 12/01/2016

## 2016-06-14 ENCOUNTER — Encounter: Payer: Self-pay | Admitting: Obstetrics and Gynecology

## 2016-06-14 ENCOUNTER — Ambulatory Visit (INDEPENDENT_AMBULATORY_CARE_PROVIDER_SITE_OTHER): Payer: Medicaid Other | Admitting: Obstetrics and Gynecology

## 2016-06-14 VITALS — BP 131/71 | HR 97 | Wt 190.0 lb

## 2016-06-14 DIAGNOSIS — N870 Mild cervical dysplasia: Secondary | ICD-10-CM | POA: Diagnosis not present

## 2016-06-14 DIAGNOSIS — Z3402 Encounter for supervision of normal first pregnancy, second trimester: Secondary | ICD-10-CM

## 2016-06-14 DIAGNOSIS — Z1389 Encounter for screening for other disorder: Secondary | ICD-10-CM

## 2016-06-14 DIAGNOSIS — R87620 Atypical squamous cells of undetermined significance on cytologic smear of vagina (ASC-US): Secondary | ICD-10-CM | POA: Diagnosis not present

## 2016-06-14 DIAGNOSIS — R87811 Vaginal high risk human papillomavirus (HPV) DNA test positive: Secondary | ICD-10-CM | POA: Diagnosis not present

## 2016-06-14 DIAGNOSIS — A599 Trichomoniasis, unspecified: Secondary | ICD-10-CM | POA: Diagnosis not present

## 2016-06-14 DIAGNOSIS — Z3A15 15 weeks gestation of pregnancy: Secondary | ICD-10-CM

## 2016-06-14 DIAGNOSIS — Z331 Pregnant state, incidental: Secondary | ICD-10-CM | POA: Diagnosis not present

## 2016-06-14 DIAGNOSIS — Z3682 Encounter for antenatal screening for nuchal translucency: Secondary | ICD-10-CM

## 2016-06-14 LAB — POCT WET PREP (WET MOUNT): Trichomonas Wet Prep HPF POC: ABSENT

## 2016-06-14 NOTE — Addendum Note (Signed)
Addended by: Moss McRESENZO, Kimberlea Schlag M on: 06/14/2016 04:06 PM   Modules accepted: Orders

## 2016-06-14 NOTE — Progress Notes (Signed)
    GYNECOLOGY CLINIC COLPOSCOPY PROCEDURE NOTE  21 y.o. G2P0010 here for colposcopy for ASCUS with POSITIVE high risk HPV pap smear on last visit . Was also treated for Trichomonas. Discussed role for HPV in cervical dysplasia, need for surveillance.  Patient given informed consent, signed copy in the chart, time out was performed.  Placed in lithotomy position. Cervix viewed with speculum and colposcope after application of acetic acid.   Colposcopy adequate? Yes  acetowhite lesion(s) noted at 3:00 o'clock; .  No ECC specimen obtained  Colposcopy impression: CIN-1 Patient was given post procedure instructions.  Patient may have some irritation but otherwise fine follow-up routinely risk of the cervix 6 weeks postpartum Additionally wet prep is performed and shows no evidence of Trichomonas  Crystal Hines,Crystal Hines  Attending Obstetrician & Gynecologist, [facility]   Assessment 1 on colposcope exam will follow her clinically until postpartum                   2. Proliferative cure negative for Trichomonas

## 2016-06-18 LAB — MATERNAL SCREEN, INTEGRATED #2
ADSF: 0.71
AFP MOM: 1.31
Alpha-Fetoprotein: 33.9 ng/mL
Crown Rump Length: 56.2 mm
DIA MoM: 0.99
DIA Value: 159.7 pg/mL
Estriol, Unconjugated: 0.46 ng/mL
GEST. AGE ON COLLECTION DATE: 12.1 wk
Gestational Age: 15.3 weeks
HCG VALUE: 58.1 [IU]/mL
MATERNAL AGE AT EDD: 22 a
NUMBER OF FETUSES: 1
Nuchal Translucency (NT): 2.4 mm
Nuchal Translucency MoM: 1.64
PAPP-A MoM: 1.22
PAPP-A VALUE: 756.5 ng/mL
Test Results:: NEGATIVE
WEIGHT: 191 [lb_av]
WEIGHT: 191 [lb_av]
hCG MoM: 1.61

## 2016-06-28 ENCOUNTER — Emergency Department (HOSPITAL_COMMUNITY)
Admission: EM | Admit: 2016-06-28 | Discharge: 2016-06-28 | Disposition: A | Discharge status: Home / Self Care | Payer: Medicaid Other | Attending: Emergency Medicine | Admitting: Emergency Medicine

## 2016-06-28 ENCOUNTER — Encounter (HOSPITAL_COMMUNITY): Payer: Self-pay | Admitting: *Deleted

## 2016-06-28 DIAGNOSIS — O23592 Infection of other part of genital tract in pregnancy, second trimester: Secondary | ICD-10-CM | POA: Insufficient documentation

## 2016-06-28 DIAGNOSIS — Z3A18 18 weeks gestation of pregnancy: Secondary | ICD-10-CM | POA: Diagnosis not present

## 2016-06-28 DIAGNOSIS — N9089 Other specified noninflammatory disorders of vulva and perineum: Secondary | ICD-10-CM | POA: Diagnosis not present

## 2016-06-28 DIAGNOSIS — Z87891 Personal history of nicotine dependence: Secondary | ICD-10-CM | POA: Insufficient documentation

## 2016-06-28 NOTE — ED Provider Notes (Signed)
[facility] DEPT Provider Note   CSN: [id number] Arrival date & time: 06/28/16  1318     History   Chief Complaint Chief Complaint  Patient presents with  . Vaginal Pain    HPI Crystal Hines is a 21 y.o. female.  Patient is [redacted] weeks pregnant. Due date is June 28. Follow[facility] by [facility]. Gravida 2 para 1. Patient woke this morning with swelling to the right side of her labia with some slight irritation. Has somewhat improved throughout the day. Patient's had an ultrasound by OB/GYN and initial workup. Last seen by them January 4. No complicating factors to the pregnancy so far.      Past Medical History:  Diagnosis Date  . Pregnant 08/23/2015    Patient Active Problem List   Diagnosis Date Noted  . Abnormal Pap smear of cervix 05/28/2016  . Supervision of normal pregnancy 05/18/2016  . Trichomonas infection 05/18/2016    Past Surgical History:  Procedure Laterality Date  . NO PAST SURGERIES      OB History    Gravida Para Term Preterm AB Living   2       1     SAB TAB Ectopic Multiple Live Births   1               Home Medications    Prior to Admission medications   Medication Sig Start Date End Date Taking? Authorizing Provider  prenatal vitamin w/FE, FA (PRENATAL 1 + 1) 27-1 MG TABS tablet Take 1 tablet by mouth daily at 12 noon. 05/07/16  Yes Adline Potter, NP    Family History Family History  Problem Relation Age of Onset  . Hypertension Mother   . Hypertension Father   . Other Father     blood clots  . Hypertension Maternal Grandmother   . Hypertension Maternal Grandfather   . Congestive Heart Failure Paternal Grandfather     Social History Social History  Substance Use Topics  . Smoking status: Former Smoker    Years: 2.00    Types: Cigarettes  . Smokeless tobacco: Never Used  . Alcohol use No     Allergies   Patient has no known allergies.   Review of Systems Review of Systems  Constitutional: Negative for  fever.  HENT: Negative for congestion.   Eyes: Negative for redness.  Respiratory: Negative for shortness of breath.   Cardiovascular: Negative for chest pain.  Gastrointestinal: Negative for abdominal pain.  Genitourinary: Negative for dysuria, vaginal bleeding and vaginal discharge.  Musculoskeletal: Negative for back pain.  Skin: Negative for rash.  Neurological: Negative for headaches.  Hematological: Does not bruise/bleed easily.  Psychiatric/Behavioral: Negative for confusion.     Physical Exam Updated Vital Signs BP 129/61   Pulse 100   Temp 98 F (36.7 C) (Oral)   Resp 15   Ht 5\' 8"  (1.727 m)   Wt 86.6 kg   LMP 02/06/2016 (Approximate)   SpO2 100%   BMI 29.04 kg/m   Physical Exam  Constitutional: She is oriented to person, place, and time. She appears well-developed and well-nourished. No distress.  HENT:  Head: Normocephalic and atraumatic.  Mouth/Throat: Oropharynx is clear and moist.  Eyes: Conjunctivae and EOM are normal. Pupils are equal, round, and reactive to light.  Neck: Normal range of motion. Neck supple.  Cardiovascular: Normal rate, regular rhythm and normal heart sounds.   Pulmonary/Chest: Effort normal and breath sounds normal.  Abdominal: Soft. Bowel sounds are normal. There is no  tenderness.  Genitourinary: No vaginal discharge found.  Genitourinary Comments: External genitalia without any acute abnormalities. Will be a slight swelling to both the labia. Right greater than left. Right side with evidence of some slight irritation. No labial abscess no vaginal discharge.  Musculoskeletal: Normal range of motion.  Neurological: She is alert and oriented to person, place, and time. No cranial nerve deficit or sensory deficit. She exhibits normal muscle tone. Coordination normal.  Skin: Skin is warm.  Nursing note and vitals reviewed.    ED Treatments / Results  Labs (all labs ordered are listed, but only abnormal results are displayed) Labs  Reviewed - No data to display  EKG  EKG Interpretation None       Radiology No results found.  Procedures Procedures (including critical care time)  Medications Ordered in ED Medications - No data to display   Initial Impression / Assessment and Plan / ED Course  I have reviewed the triage vital signs and the nursing notes.  Pertinent labs & imaging results that were available during my care of the patient were reviewed by me and considered in my medical decision making (see chart for details).     Patient's [redacted] weeks pregnant followed by [facility]. Patient's had to visits by then. Patient's due date is in June 28. Patient sore he had an ultrasound. Patient concerned about swelling of the right side of her labia. And some irritation. Patient states she hasn't started name products. Living that she's put down that in that area so. Examination shows a little bit of labial irritation bilaterally low bit of slight swelling on the right side compared to left no serious infection or abscesses. No discharge.  We'll treat just with symptomatically treatment. Follow-up with OB/GYN.  Final Clinical Impressions(s) / ED Diagnoses   Final diagnoses:  Labial irritation    New Prescriptions New Prescriptions   No medications on file     Vanetta MuldersScott Aneyah Lortz, MD 06/28/16 1629

## 2016-06-28 NOTE — ED Triage Notes (Signed)
Pt c/o burning vaginal pain and labia swelling that started this morning upon wakening. Pt is [redacted] weeks pregnant. Denies discharge.

## 2016-06-28 NOTE — ED Notes (Signed)
Pt c/o labia swelling and burning to vagina today. Denies any bleeding or d/c. Pt is [redacted] weeks pregnant. nad noted.

## 2016-06-28 NOTE — Discharge Instructions (Signed)
No evidence of any serious infection. Looks like there is a little bit of labial irritation. With any recommend some baby oil there. May think in terms of whatever type of the subcutaneous using could be irritating that area as well. Follow-up with your OB/GYN if things do not get better.

## 2016-06-29 ENCOUNTER — Encounter: Payer: Self-pay | Admitting: Obstetrics and Gynecology

## 2016-06-29 DIAGNOSIS — R102 Pelvic and perineal pain: Secondary | ICD-10-CM | POA: Insufficient documentation

## 2016-07-11 ENCOUNTER — Other Ambulatory Visit: Payer: Self-pay | Admitting: Obstetrics and Gynecology

## 2016-07-11 DIAGNOSIS — Z363 Encounter for antenatal screening for malformations: Secondary | ICD-10-CM

## 2016-07-12 ENCOUNTER — Ambulatory Visit (INDEPENDENT_AMBULATORY_CARE_PROVIDER_SITE_OTHER): Payer: Medicaid Other | Admitting: Obstetrics & Gynecology

## 2016-07-12 ENCOUNTER — Encounter: Payer: Self-pay | Admitting: Obstetrics & Gynecology

## 2016-07-12 ENCOUNTER — Ambulatory Visit (INDEPENDENT_AMBULATORY_CARE_PROVIDER_SITE_OTHER): Payer: Medicaid Other

## 2016-07-12 VITALS — BP 100/70 | HR 92 | Wt 191.3 lb

## 2016-07-12 DIAGNOSIS — O283 Abnormal ultrasonic finding on antenatal screening of mother: Secondary | ICD-10-CM | POA: Diagnosis not present

## 2016-07-12 DIAGNOSIS — Z3402 Encounter for supervision of normal first pregnancy, second trimester: Secondary | ICD-10-CM

## 2016-07-12 DIAGNOSIS — Z3A19 19 weeks gestation of pregnancy: Secondary | ICD-10-CM | POA: Diagnosis not present

## 2016-07-12 DIAGNOSIS — O321XX1 Maternal care for breech presentation, fetus 1: Secondary | ICD-10-CM | POA: Diagnosis not present

## 2016-07-12 DIAGNOSIS — Z363 Encounter for antenatal screening for malformations: Secondary | ICD-10-CM

## 2016-07-12 NOTE — Progress Notes (Signed)
US 19 wks,breech,ant pl gr 0,normal ov's bilat,cx 3.2 cm,fhr 140 bpm,two LVEICF( #1) 2.3 mm (#2) 2.5 mm,svp of fluid 4.4 cm,efw 282 g,anatomy complete

## 2016-07-12 NOTE — Progress Notes (Signed)
G2P0010 4691w0d Estimated Date of Delivery: 12/06/16  Blood pressure 100/70, pulse 92, weight 191 lb 4.8 oz (86.8 kg), last menstrual period 02/06/2016.   BP weight and urine results all reviewed and noted.  Please refer to the obstetrical flow sheet for the fundal height and fetal heart rate documentation:  Patient reports good fetal movement, denies any bleeding and no rupture of membranes symptoms or regular contractions. Patient is without complaints. All questions were answered.  No orders of the defined types were placed in this encounter.   Plan:  Continued routine obstetrical care, sonogram with 2 EICF, normal maternal IT, rest of anatomy is normal re evlauate at 28 weeks since there are 2  No Follow-up on file.

## 2016-08-09 ENCOUNTER — Encounter: Payer: Self-pay | Admitting: Advanced Practice Midwife

## 2016-08-09 ENCOUNTER — Ambulatory Visit (INDEPENDENT_AMBULATORY_CARE_PROVIDER_SITE_OTHER): Payer: Medicaid Other | Admitting: Advanced Practice Midwife

## 2016-08-09 VITALS — BP 136/74 | HR 108 | Wt 192.0 lb

## 2016-08-09 DIAGNOSIS — Z331 Pregnant state, incidental: Secondary | ICD-10-CM | POA: Diagnosis not present

## 2016-08-09 DIAGNOSIS — O358XX1 Maternal care for other (suspected) fetal abnormality and damage, fetus 1: Secondary | ICD-10-CM

## 2016-08-09 DIAGNOSIS — Z3A23 23 weeks gestation of pregnancy: Secondary | ICD-10-CM | POA: Diagnosis not present

## 2016-08-09 DIAGNOSIS — Z3482 Encounter for supervision of other normal pregnancy, second trimester: Secondary | ICD-10-CM | POA: Diagnosis not present

## 2016-08-09 DIAGNOSIS — O283 Abnormal ultrasonic finding on antenatal screening of mother: Secondary | ICD-10-CM

## 2016-08-09 DIAGNOSIS — Z1389 Encounter for screening for other disorder: Secondary | ICD-10-CM

## 2016-08-09 LAB — POCT URINALYSIS DIPSTICK
Glucose, UA: NEGATIVE
KETONES UA: NEGATIVE
Nitrite, UA: NEGATIVE
PROTEIN UA: NEGATIVE
RBC UA: NEGATIVE

## 2016-08-09 NOTE — Patient Instructions (Signed)

## 2016-08-09 NOTE — Progress Notes (Signed)
G2P0010 7882w0d Estimated Date of Delivery: 12/06/16  Blood pressure 136/74, pulse (!) 108, weight 192 lb (87.1 kg), last menstrual period 02/06/2016.   BP weight and urine results all reviewed and noted.  Please refer to the obstetrical flow sheet for the fundal height and fetal heart rate documentation:  Patient reports good fetal movement, denies any bleeding and no rupture of membranes symptoms or regular contractions. Patient is without complaints.  Eats "great"   All questions were answered.  Orders Placed This Encounter  Procedures  . US OB Follow Up  . POCT urinalysis dipstick    Plan:  Continued routine obstetrical care,   Return in about 4 weeks (around 09/06/2016) for PN2/LROB, US:OB F/U:.EICF X 2 (recommended per [facility] because there are 2)

## 2016-09-06 ENCOUNTER — Other Ambulatory Visit: Payer: Medicaid Other

## 2016-09-06 ENCOUNTER — Encounter: Payer: Self-pay | Admitting: Women's Health

## 2016-09-06 ENCOUNTER — Ambulatory Visit (INDEPENDENT_AMBULATORY_CARE_PROVIDER_SITE_OTHER): Payer: Medicaid Other | Admitting: Women's Health

## 2016-09-06 ENCOUNTER — Ambulatory Visit (INDEPENDENT_AMBULATORY_CARE_PROVIDER_SITE_OTHER): Payer: Medicaid Other

## 2016-09-06 VITALS — BP 140/80 | HR 84 | Wt 200.2 lb

## 2016-09-06 DIAGNOSIS — O283 Abnormal ultrasonic finding on antenatal screening of mother: Secondary | ICD-10-CM

## 2016-09-06 DIAGNOSIS — Z131 Encounter for screening for diabetes mellitus: Secondary | ICD-10-CM

## 2016-09-06 DIAGNOSIS — N644 Mastodynia: Secondary | ICD-10-CM

## 2016-09-06 DIAGNOSIS — Z3482 Encounter for supervision of other normal pregnancy, second trimester: Secondary | ICD-10-CM

## 2016-09-06 DIAGNOSIS — Z3402 Encounter for supervision of normal first pregnancy, second trimester: Secondary | ICD-10-CM

## 2016-09-06 DIAGNOSIS — Z3A27 27 weeks gestation of pregnancy: Secondary | ICD-10-CM

## 2016-09-06 NOTE — Progress Notes (Signed)
US 27 wks,breech,cx 3.5 mm,ant pl gr 0,bilat adnexa's wnl,afi 19.5 cm,two LVEICF 1.5 mm,1.8 mm,fhr 153 bpm,efw 955 g 43%

## 2016-09-06 NOTE — Progress Notes (Signed)
Low-risk OB appointment G2P0010 5827w0d Estimated Date of Delivery: 12/06/16 BP 140/80   Pulse 84   Wt 200 lb 3.2 oz (90.8 kg)   LMP 02/06/2016 (Approximate)   BMI 30.44 kg/m   BP, weight reviewed. Unable to void, will try again before she leaves.  Refer to obstetrical flow sheet for FH & FHR.  Reports good fm.  Denies regular uc's, lof, vb, or uti s/s. Lt nipple pain x 1mth, hurts pretty much all the time, sharp shooting pain, has tried cool compresses and apap w/o relief. Also occasional leakage of milky fluid. No known injury.  Large pendulous breasts, E cup, no masses/lumps/bumps, no redness, no scaling, breast appears completely normal. Does have tenderness to palpation of areola and nipple.  Discussed w/ [facility], will try APNO and Vit E oil BP elevated today, was also elevated at 11wks- no h/o HTN, will have come back on Mon for bp check Denies ha, visual changes, ruq/epigastric pain, n/v.    Reviewed ptl s/s, fkc. Discussed f/u u/s today for 2 Lt EICF- they are stable, had neg nt/it, no further f/u needed. Recommended Tdap at HD/PCP per CDC guidelines. Plan:  Continue routine obstetrical care  F/U on Monday for OB appointment, bp check, f/u on Lt nipple/areola pain PN2 today

## 2016-09-06 NOTE — Patient Instructions (Addendum)
Vitamin E oil to nipple  Call the office [phone]) or go to [facility] if:  You begin to have strong, frequent contractions  Your water breaks.  Sometimes it is a big gush of fluid, sometimes it is just a trickle that keeps getting your panties wet or running down your legs  You have vaginal bleeding.  It is normal to have a small amount of spotting if your cervix was checked.   You don't feel your baby moving like normal.  If you don't, get you something to eat and drink and lay down and focus on feeling your baby move.  You should feel at least 10 movements in 2 hours.  If you don't, you should call the office or go to [facility].    Tdap Vaccine  It is recommended that you get the Tdap vaccine during the third trimester of EACH pregnancy to help protect your baby from getting pertussis (whooping cough)  27-36 weeks is the BEST time to do this so that you can pass the protection on to your baby. During pregnancy is better than after pregnancy, but if you are unable to get it during pregnancy it will be offered at the hospital.   You can get this vaccine at the health department or your family doctor  Everyone who will be around your baby should also be up-to-date on their vaccines. Adults (who are not pregnant) only need 1 dose of Tdap during adulthood.   [place] Pediatricians/Family Doctors:  [facility] [phone]            [facility] [phone]                 [facility] [phone] (usually not accepting new patients unless you have family there already, you are always welcome to call and ask)            [facility] ([address] [place]) [phone]   [facility]:   [facility]: [phone]  [facility]: [phone]   Third Trimester of Pregnancy The third trimester is from week 29 through week 42, months 7 through 9. The third  trimester is a time when the fetus is growing rapidly. At the end of the ninth month, the fetus is about 20 inches in length and weighs 6-10 pounds.  BODY CHANGES Your body goes through many changes during pregnancy. The changes vary from woman to woman.   Your weight will continue to increase. You can expect to gain 25-35 pounds (11-16 kg) by the end of the pregnancy.  You may begin to get stretch marks on your hips, abdomen, and breasts.  You may urinate more often because the fetus is moving lower into your pelvis and pressing on your bladder.  You may develop or continue to have heartburn as a result of your pregnancy.  You may develop constipation because certain hormones are causing the muscles that push waste through your intestines to slow down.  You may develop hemorrhoids or swollen, bulging veins (varicose veins).  You may have pelvic pain because of the weight gain and pregnancy hormones relaxing your joints between the bones in your pelvis. Backaches may result from overexertion of the muscles supporting your posture.  You may have changes in your hair. These can include thickening of your hair, rapid growth, and changes in texture. Some women also have hair loss during or after pregnancy, or hair that feels dry or thin. Your hair will most  likely return to normal after your baby is born.  Your breasts will continue to grow and be tender. A yellow discharge may leak from your breasts called colostrum.  Your belly button may stick out.  You may feel short of breath because of your expanding uterus.  You may notice the fetus "dropping," or moving lower in your abdomen.  You may have a bloody mucus discharge. This usually occurs a few days to a week before labor begins.  Your cervix becomes thin and soft (effaced) near your due date. WHAT TO EXPECT AT YOUR PRENATAL EXAMS  You will have prenatal exams every 2 weeks until week 36. Then, you will have weekly prenatal exams.  During a routine prenatal visit:  You will be weighed to make sure you and the fetus are growing normally.  Your blood pressure is taken.  Your abdomen will be measured to track your baby's growth.  The fetal heartbeat will be listened to.  Any test results from the previous visit will be discussed.  You may have a cervical check near your due date to see if you have effaced. At around 36 weeks, your caregiver will check your cervix. At the same time, your caregiver will also perform a test on the secretions of the vaginal tissue. This test is to determine if a type of bacteria, Group B streptococcus, is present. Your caregiver will explain this further. Your caregiver may ask you:  What your birth plan is.  How you are feeling.  If you are feeling the baby move.  If you have had any abnormal symptoms, such as leaking fluid, bleeding, severe headaches, or abdominal cramping.  If you have any questions. Other tests or screenings that may be performed during your third trimester include:  Blood tests that check for low iron levels (anemia).  Fetal testing to check the health, activity level, and growth of the fetus. Testing is done if you have certain medical conditions or if there are problems during the pregnancy. FALSE LABOR You may feel small, irregular contractions that eventually go away. These are called Braxton Hicks contractions, or false labor. Contractions may last for hours, days, or even weeks before true labor sets in. If contractions come at regular intervals, intensify, or become painful, it is best to be seen by your caregiver.  SIGNS OF LABOR   Menstrual-like cramps.  Contractions that are 5 minutes apart or less.  Contractions that start on the top of the uterus and spread down to the lower abdomen and back.  A sense of increased pelvic pressure or back pain.  A watery or bloody mucus discharge that comes from the vagina. If you have any of these signs  before the 37th week of pregnancy, call your caregiver right away. You need to go to the hospital to get checked immediately. HOME CARE INSTRUCTIONS   Avoid all smoking, herbs, alcohol, and unprescribed drugs. These chemicals affect the formation and growth of the baby.  Follow your caregiver's instructions regarding medicine use. There are medicines that are either safe or unsafe to take during pregnancy.  Exercise only as directed by your caregiver. Experiencing uterine cramps is a good sign to stop exercising.  Continue to eat regular, healthy meals.  Wear a good support bra for breast tenderness.  Do not use hot tubs, steam rooms, or saunas.  Wear your seat belt at all times when driving.  Avoid raw meat, uncooked cheese, cat litter boxes, and soil used by cats. These carry  germs that can cause birth defects in the baby.  Take your prenatal vitamins.  Try taking a stool softener (if your caregiver approves) if you develop constipation. Eat more high-fiber foods, such as fresh vegetables or fruit and whole grains. Drink plenty of fluids to keep your urine clear or pale yellow.  Take warm sitz baths to soothe any pain or discomfort caused by hemorrhoids. Use hemorrhoid cream if your caregiver approves.  If you develop varicose veins, wear support hose. Elevate your feet for 15 minutes, 3-4 times a day. Limit salt in your diet.  Avoid heavy lifting, wear low heal shoes, and practice good posture.  Rest a lot with your legs elevated if you have leg cramps or low back pain.  Visit your dentist if you have not gone during your pregnancy. Use a soft toothbrush to brush your teeth and be gentle when you floss.  A sexual relationship may be continued unless your caregiver directs you otherwise.  Do not travel far distances unless it is absolutely necessary and only with the approval of your caregiver.  Take prenatal classes to understand, practice, and ask questions about the labor  and delivery.  Make a trial run to the hospital.  Pack your hospital bag.  Prepare the baby's nursery.  Continue to go to all your prenatal visits as directed by your caregiver. SEEK MEDICAL CARE IF:  You are unsure if you are in labor or if your water has broken.  You have dizziness.  You have mild pelvic cramps, pelvic pressure, or nagging pain in your abdominal area.  You have persistent nausea, vomiting, or diarrhea.  You have a bad smelling vaginal discharge.  You have pain with urination. SEEK IMMEDIATE MEDICAL CARE IF:   You have a fever.  You are leaking fluid from your vagina.  You have spotting or bleeding from your vagina.  You have severe abdominal cramping or pain.  You have rapid weight loss or gain.  You have shortness of breath with chest pain.  You notice sudden or extreme swelling of your face, hands, ankles, feet, or legs.  You have not felt your baby move in over an hour.  You have severe headaches that do not go away with medicine.  You have vision changes. Document Released: 05/22/2001 Document Revised: 06/02/2013 Document Reviewed: 07/29/2012 [facility] Patient Information 2015 [place], [place]. This information is not intended to replace advice given to you by your health care provider. Make sure you discuss any questions you have with your health care provider.

## 2016-09-07 LAB — CBC
HEMATOCRIT: 35.6 % (ref 34.0–46.6)
HEMOGLOBIN: 11.6 g/dL (ref 11.1–15.9)
MCH: 28.9 pg (ref 26.6–33.0)
MCHC: 32.6 g/dL (ref 31.5–35.7)
MCV: 89 fL (ref 79–97)
Platelets: 255 10*3/uL (ref 150–379)
RBC: 4.02 x10E6/uL (ref 3.77–5.28)
RDW: 13.6 % (ref 12.3–15.4)
WBC: 7 10*3/uL (ref 3.4–10.8)

## 2016-09-07 LAB — RPR: RPR Ser Ql: NONREACTIVE

## 2016-09-07 LAB — GLUCOSE TOLERANCE, 2 HOURS W/ 1HR
GLUCOSE, 2 HOUR: 57 mg/dL — AB (ref 65–152)
GLUCOSE, FASTING: 73 mg/dL (ref 65–91)
Glucose, 1 hour: 81 mg/dL (ref 65–179)

## 2016-09-07 LAB — HIV ANTIBODY (ROUTINE TESTING W REFLEX): HIV SCREEN 4TH GENERATION: NONREACTIVE

## 2016-09-07 LAB — ANTIBODY SCREEN: ANTIBODY SCREEN: NEGATIVE

## 2016-09-10 ENCOUNTER — Encounter: Payer: Self-pay | Admitting: Obstetrics & Gynecology

## 2016-09-10 ENCOUNTER — Encounter: Payer: Medicaid Other | Admitting: Obstetrics & Gynecology

## 2016-09-10 VITALS — BP 130/80 | HR 78 | Wt 201.2 lb

## 2016-09-10 LAB — POCT URINALYSIS DIPSTICK
Blood, UA: NEGATIVE
GLUCOSE UA: NEGATIVE
Ketones, UA: NEGATIVE
Leukocytes, UA: NEGATIVE
Nitrite, UA: NEGATIVE

## 2016-09-12 ENCOUNTER — Ambulatory Visit (INDEPENDENT_AMBULATORY_CARE_PROVIDER_SITE_OTHER): Payer: Medicaid Other | Admitting: Advanced Practice Midwife

## 2016-09-12 ENCOUNTER — Encounter: Payer: Self-pay | Admitting: Advanced Practice Midwife

## 2016-09-12 VITALS — BP 110/70 | HR 76 | Wt 201.0 lb

## 2016-09-12 DIAGNOSIS — Z3482 Encounter for supervision of other normal pregnancy, second trimester: Secondary | ICD-10-CM | POA: Diagnosis not present

## 2016-09-12 NOTE — Progress Notes (Signed)
G2P0010 [redacted]w[redacted]d Estimated Date of Delivery: 12/06/16  Blood pressure 110/70, pulse 76, weight 201 lb (91.2 kg), last menstrual period 02/06/2016.     F/U for last week w/ borederline B P, Left nipple pain.  Nipple pain wasn't helped, BP good today.   BP weight and urine results all reviewed and noted.  Please refer to the obstetrical flow sheet for the fundal height and fetal heart rate documentation:  Patient reports good fetal movement, denies any bleeding and no rupture of membranes symptoms or regular contractions. Patient is without complaints. All questions were answered.  No orders of the defined types were placed in this encounter.   Plan:  Continued routine obstetrical care,   Return in about 3 weeks (around 10/03/2016) for LROB.

## 2016-09-16 NOTE — Progress Notes (Signed)
This encounter was created in error - please disregard.

## 2016-10-03 ENCOUNTER — Encounter: Payer: Self-pay | Admitting: Advanced Practice Midwife

## 2016-10-03 ENCOUNTER — Ambulatory Visit (INDEPENDENT_AMBULATORY_CARE_PROVIDER_SITE_OTHER): Payer: Medicaid Other | Admitting: Advanced Practice Midwife

## 2016-10-03 VITALS — BP 126/72 | HR 88 | Wt 203.0 lb

## 2016-10-03 DIAGNOSIS — Z3483 Encounter for supervision of other normal pregnancy, third trimester: Secondary | ICD-10-CM

## 2016-10-03 DIAGNOSIS — Z1389 Encounter for screening for other disorder: Secondary | ICD-10-CM

## 2016-10-03 DIAGNOSIS — Z331 Pregnant state, incidental: Secondary | ICD-10-CM | POA: Diagnosis not present

## 2016-10-03 NOTE — Progress Notes (Signed)
G2P0010 [redacted]w[redacted]d Estimated Date of Delivery: 12/06/16  Blood pressure 126/72, pulse 88, weight 203 lb (92.1 kg), last menstrual period 02/06/2016.   BP weight and urine results all reviewed and noted.  Please refer to the obstetrical flow sheet for the fundal height and fetal heart rate documentation:  Patient reports good fetal movement, denies any bleeding and no rupture of membranes symptoms or regular contractions. Patient is without complaints. All questions were answered.  Orders Placed This Encounter  Procedures  . POCT urinalysis dipstick    Plan:  Continued routine obstetrical care,   Return in about 2 weeks (around 10/17/2016) for LROB.

## 2016-10-03 NOTE — Patient Instructions (Signed)
Third Trimester of Pregnancy The third trimester is from week 28 through week 40 (months 7 through 9). The third trimester is a time when the unborn baby (fetus) is growing rapidly. At the end of the ninth month, the fetus is about 20 inches in length and weighs 6-10 pounds. Body changes during your third trimester Your body will continue to go through many changes during pregnancy. The changes vary from woman to woman. During the third trimester:  Your weight will continue to increase. You can expect to gain 25-35 pounds (11-16 kg) by the end of the pregnancy.  You may begin to get stretch marks on your hips, abdomen, and breasts.  You may urinate more often because the fetus is moving lower into your pelvis and pressing on your bladder.  You may develop or continue to have heartburn. This is caused by increased hormones that slow down muscles in the digestive tract.  You may develop or continue to have constipation because increased hormones slow digestion and cause the muscles that push waste through your intestines to relax.  You may develop hemorrhoids. These are swollen veins (varicose veins) in the rectum that can itch or be painful.  You may develop swollen, bulging veins (varicose veins) in your legs.  You may have increased body aches in the pelvis, back, or thighs. This is due to weight gain and increased hormones that are relaxing your joints.  You may have changes in your hair. These can include thickening of your hair, rapid growth, and changes in texture. Some women also have hair loss during or after pregnancy, or hair that feels dry or thin. Your hair will most likely return to normal after your baby is born.  Your breasts will continue to grow and they will continue to become tender. A yellow fluid (colostrum) may leak from your breasts. This is the first milk you are producing for your baby.  Your belly button may stick out.  You may notice more swelling in your hands,  face, or ankles.  You may have increased tingling or numbness in your hands, arms, and legs. The skin on your belly may also feel numb.  You may feel short of breath because of your expanding uterus.  You may have more problems sleeping. This can be caused by the size of your belly, increased need to urinate, and an increase in your body's metabolism.  You may notice the fetus "dropping," or moving lower in your abdomen (lightening).  You may have increased vaginal discharge.  You may notice your joints feel loose and you may have pain around your pelvic bone.  What to expect at prenatal visits You will have prenatal exams every 2 weeks until week 36. Then you will have weekly prenatal exams. During a routine prenatal visit:  You will be weighed to make sure you and the baby are growing normally.  Your blood pressure will be taken.  Your abdomen will be measured to track your baby's growth.  The fetal heartbeat will be listened to.  Any test results from the previous visit will be discussed.  You may have a cervical check near your due date to see if your cervix has softened or thinned (effaced).  You will be tested for Group B streptococcus. This happens between 35 and 37 weeks.  Your health care provider may ask you:  What your birth plan is.  How you are feeling.  If you are feeling the baby move.  If you have had   any abnormal symptoms, such as leaking fluid, bleeding, severe headaches, or abdominal cramping.  If you are using any tobacco products, including cigarettes, chewing tobacco, and electronic cigarettes.  If you have any questions.  Other tests or screenings that may be performed during your third trimester include:  Blood tests that check for low iron levels (anemia).  Fetal testing to check the health, activity level, and growth of the fetus. Testing is done if you have certain medical conditions or if there are problems during the  pregnancy.  Nonstress test (NST). This test checks the health of your baby to make sure there are no signs of problems, such as the baby not getting enough oxygen. During this test, a belt is placed around your belly. The baby is made to move, and its heart rate is monitored during movement.  What is false labor? False labor is a condition in which you feel small, irregular tightenings of the muscles in the womb (contractions) that usually go away with rest, changing position, or drinking water. These are called Braxton Hicks contractions. Contractions may last for hours, days, or even weeks before true labor sets in. If contractions come at regular intervals, become more frequent, increase in intensity, or become painful, you should see your health care provider. What are the signs of labor?  Abdominal cramps.  Regular contractions that start at 10 minutes apart and become stronger and more frequent with time.  Contractions that start on the top of the uterus and spread down to the lower abdomen and back.  Increased pelvic pressure and dull back pain.  A watery or bloody mucus discharge that comes from the vagina.  Leaking of amniotic fluid. This is also known as your "water breaking." It could be a slow trickle or a gush. Let your health care provider know if it has a color or strange odor. If you have any of these signs, call your health care provider right away, even if it is before your due date. Follow these instructions at home: Medicines  Follow your health care provider's instructions regarding medicine use. Specific medicines may be either safe or unsafe to take during pregnancy.  Take a prenatal vitamin that contains at least 600 micrograms (mcg) of folic acid.  If you develop constipation, try taking a stool softener if your health care provider approves. Eating and drinking  Eat a balanced diet that includes fresh fruits and vegetables, whole grains, good sources of protein  such as meat, eggs, or tofu, and low-fat dairy. Your health care provider will help you determine the amount of weight gain that is right for you.  Avoid raw meat and uncooked cheese. These carry germs that can cause birth defects in the baby.  If you have low calcium intake from food, talk to your health care provider about whether you should take a daily calcium supplement.  Eat four or five small meals rather than three large meals a day.  Limit foods that are high in fat and processed sugars, such as fried and sweet foods.  To prevent constipation: ? Drink enough fluid to keep your urine clear or pale yellow. ? Eat foods that are high in fiber, such as fresh fruits and vegetables, whole grains, and beans. Activity  Exercise only as directed by your health care provider. Most women can continue their usual exercise routine during pregnancy. Try to exercise for 30 minutes at least 5 days a week. Stop exercising if you experience uterine contractions.  Avoid heavy   lifting.  Do not exercise in extreme heat or humidity, or at high altitudes.  Wear low-heel, comfortable shoes.  Practice good posture.  You may continue to have sex unless your health care provider tells you otherwise. Relieving pain and discomfort  Take frequent breaks and rest with your legs elevated if you have leg cramps or low back pain.  Take warm sitz baths to soothe any pain or discomfort caused by hemorrhoids. Use hemorrhoid cream if your health care provider approves.  Wear a good support bra to prevent discomfort from breast tenderness.  If you develop varicose veins: ? Wear support pantyhose or compression stockings as told by your healthcare provider. ? Elevate your feet for 15 minutes, 3-4 times a day. Prenatal care  Write down your questions. Take them to your prenatal visits.  Keep all your prenatal visits as told by your health care provider. This is important. Safety  Wear your seat belt at  all times when driving.  Make a list of emergency phone numbers, including numbers for family, friends, the hospital, and police and fire departments. General instructions  Avoid cat litter boxes and soil used by cats. These carry germs that can cause birth defects in the baby. If you have a cat, ask someone to clean the litter box for you.  Do not travel far distances unless it is absolutely necessary and only with the approval of your health care provider.  Do not use hot tubs, steam rooms, or saunas.  Do not drink alcohol.  Do not use any products that contain nicotine or tobacco, such as cigarettes and e-cigarettes. If you need help quitting, ask your health care provider.  Do not use any medicinal herbs or unprescribed drugs. These chemicals affect the formation and growth of the baby.  Do not douche or use tampons or scented sanitary pads.  Do not cross your legs for long periods of time.  To prepare for the arrival of your baby: ? Take prenatal classes to understand, practice, and ask questions about labor and delivery. ? Make a trial run to the hospital. ? Visit the hospital and tour the maternity area. ? Arrange for maternity or paternity leave through employers. ? Arrange for family and friends to take care of pets while you are in the hospital. ? Purchase a rear-facing car seat and make sure you know how to install it in your car. ? Pack your hospital bag. ? Prepare the baby's nursery. Make sure to remove all pillows and stuffed animals from the baby's crib to prevent suffocation.  Visit your dentist if you have not gone during your pregnancy. Use a soft toothbrush to brush your teeth and be gentle when you floss. Contact a health care provider if:  You are unsure if you are in labor or if your water has broken.  You become dizzy.  You have mild pelvic cramps, pelvic pressure, or nagging pain in your abdominal area.  You have lower back pain.  You have persistent  nausea, vomiting, or diarrhea.  You have an unusual or bad smelling vaginal discharge.  You have pain when you urinate. Get help right away if:  Your water breaks before 37 weeks.  You have regular contractions less than 5 minutes apart before 37 weeks.  You have a fever.  You are leaking fluid from your vagina.  You have spotting or bleeding from your vagina.  You have severe abdominal pain or cramping.  You have rapid weight loss or weight gain.    You have shortness of breath with chest pain.  You notice sudden or extreme swelling of your face, hands, ankles, feet, or legs.  Your baby makes fewer than 10 movements in 2 hours.  You have severe headaches that do not go away when you take medicine.  You have vision changes. Summary  The third trimester is from week 28 through week 40, months 7 through 9. The third trimester is a time when the unborn baby (fetus) is growing rapidly.  During the third trimester, your discomfort may increase as you and your baby continue to gain weight. You may have abdominal, leg, and back pain, sleeping problems, and an increased need to urinate.  During the third trimester your breasts will keep growing and they will continue to become tender. A yellow fluid (colostrum) may leak from your breasts. This is the first milk you are producing for your baby.  False labor is a condition in which you feel small, irregular tightenings of the muscles in the womb (contractions) that eventually go away. These are called Braxton Hicks contractions. Contractions may last for hours, days, or even weeks before true labor sets in.  Signs of labor can include: abdominal cramps; regular contractions that start at 10 minutes apart and become stronger and more frequent with time; watery or bloody mucus discharge that comes from the vagina; increased pelvic pressure and dull back pain; and leaking of amniotic fluid. This information is not intended to replace advice  given to you by your health care provider. Make sure you discuss any questions you have with your health care provider. Document Released: 05/22/2001 Document Revised: 11/03/2015 Document Reviewed: 07/29/2012 Elsevier Interactive Patient Education  2017 [employer]  

## 2016-10-04 ENCOUNTER — Emergency Department (HOSPITAL_COMMUNITY)
Admission: EM | Admit: 2016-10-04 | Discharge: 2016-10-04 | Disposition: A | Discharge status: Home / Self Care | Payer: Medicaid Other | Attending: Emergency Medicine | Admitting: Emergency Medicine

## 2016-10-04 ENCOUNTER — Emergency Department (HOSPITAL_COMMUNITY): Payer: Medicaid Other

## 2016-10-04 ENCOUNTER — Encounter (HOSPITAL_COMMUNITY): Payer: Self-pay | Admitting: Emergency Medicine

## 2016-10-04 DIAGNOSIS — S91341A Puncture wound with foreign body, right foot, initial encounter: Secondary | ICD-10-CM | POA: Insufficient documentation

## 2016-10-04 DIAGNOSIS — O9A213 Injury, poisoning and certain other consequences of external causes complicating pregnancy, third trimester: Secondary | ICD-10-CM | POA: Diagnosis not present

## 2016-10-04 DIAGNOSIS — Y999 Unspecified external cause status: Secondary | ICD-10-CM | POA: Insufficient documentation

## 2016-10-04 DIAGNOSIS — Z3A32 32 weeks gestation of pregnancy: Secondary | ICD-10-CM | POA: Diagnosis not present

## 2016-10-04 DIAGNOSIS — Y939 Activity, unspecified: Secondary | ICD-10-CM | POA: Diagnosis not present

## 2016-10-04 DIAGNOSIS — T148XXA Other injury of unspecified body region, initial encounter: Secondary | ICD-10-CM

## 2016-10-04 DIAGNOSIS — Y929 Unspecified place or not applicable: Secondary | ICD-10-CM | POA: Insufficient documentation

## 2016-10-04 DIAGNOSIS — Z87891 Personal history of nicotine dependence: Secondary | ICD-10-CM | POA: Insufficient documentation

## 2016-10-04 DIAGNOSIS — W228XXA Striking against or struck by other objects, initial encounter: Secondary | ICD-10-CM | POA: Diagnosis not present

## 2016-10-04 DIAGNOSIS — S90851A Superficial foreign body, right foot, initial encounter: Secondary | ICD-10-CM

## 2016-10-04 MED ORDER — CEPHALEXIN 500 MG PO CAPS
500.0000 mg | ORAL_CAPSULE | Freq: Once | ORAL | Status: AC
  Administered 2016-10-04: 500 mg via ORAL
  Filled 2016-10-04: qty 1

## 2016-10-04 MED ORDER — ONDANSETRON HCL 4 MG PO TABS
4.0000 mg | ORAL_TABLET | Freq: Once | ORAL | Status: AC
  Administered 2016-10-04: 4 mg via ORAL
  Filled 2016-10-04: qty 1

## 2016-10-04 MED ORDER — CEPHALEXIN 500 MG PO CAPS: 500.0000 mg | ORAL_CAPSULE | Freq: Four times a day (QID) | ORAL | 0 refills | Status: DC

## 2016-10-04 NOTE — Discharge Instructions (Signed)
Please cleanse the wound to the right foot with soap and water, and or soak in warm Epsom salt water. Please apply Band-Aid until the wound has healed. Please use Keflex with breakfast, lunch, dinner, and at bedtime. Please see your primary physician or return to the emergency department if any changes or signs of advancing infection.

## 2016-10-04 NOTE — ED Provider Notes (Signed)
[facility]EM[facility] DEPT Provider Note   CSN: [id number] Arrival date & time: 10/04/16  1831     History   Chief Complaint Chief Complaint  Patient presents with  . Foot Injury    HPI Crystal Hines is a 21 y.o. female.  Patient is a 21 year old female who presents to the emergency department following a puncture wound to the foot.  The patient states that on last evening she stepped on what she thinks may have been a toothpick. She cleansed the area with peroxide and soaked the area with Epsom salt water. This morning she noticed that there was some swelling present. She became concerned because she is 8 months pregnant. She states the swelling has gone down since earlier this morning, but she wanted to come and have it checked. She's not had any fever or chills. She's not noted any red streaks going up the foot.      Past Medical History:  Diagnosis Date  . Pregnant 08/23/2015    Patient Active Problem List   Diagnosis Date Noted  . Fetal echogenic intracardiac focus on prenatal ultrasound 09/06/2016  . Vaginal pain 06/29/2016  . Abnormal Pap smear of cervix 05/28/2016  . Supervision of normal pregnancy 05/18/2016  . Trichomonas infection 05/18/2016    Past Surgical History:  Procedure Laterality Date  . NO PAST SURGERIES      OB History    Gravida Para Term Preterm AB Living   2       1     SAB TAB Ectopic Multiple Live Births   1               Home Medications    Prior to Admission medications   Medication Sig Start Date End Date Taking? Authorizing Provider  prenatal vitamin w/FE, FA (PRENATAL 1 + 1) 27-1 MG TABS tablet Take 1 tablet by mouth daily at 12 noon. 05/07/16   Adline Potter, NP    Family History Family History  Problem Relation Age of Onset  . Hypertension Mother   . Hypertension Father   . Other Father     blood clots  . Hypertension Maternal Grandmother   . Hypertension Maternal Grandfather   . Congestive Heart Failure Paternal  Grandfather     Social History Social History  Substance Use Topics  . Smoking status: Former Smoker    Years: 2.00    Types: Cigarettes  . Smokeless tobacco: Never Used  . Alcohol use No     Allergies   Patient has no known allergies.   Review of Systems Review of Systems  Constitutional: Negative for activity change.       All ROS Neg except as noted in HPI  HENT: Negative for nosebleeds.   Eyes: Negative for photophobia and discharge.  Respiratory: Negative for cough, shortness of breath and wheezing.   Cardiovascular: Negative for chest pain and palpitations.  Gastrointestinal: Negative for abdominal pain and blood in stool.  Genitourinary: Negative for dysuria, frequency and hematuria.  Musculoskeletal: Negative for arthralgias, back pain and neck pain.  Skin: Negative.   Neurological: Negative for dizziness, seizures and speech difficulty.  Psychiatric/Behavioral: Negative for confusion and hallucinations.     Physical Exam Updated Vital Signs BP (!) 145/60 (BP Location: Right Arm)   Pulse 97   Temp 98.4 F (36.9 C) (Oral)   Resp 18   Ht  (1.727 m)   Wt 92.1 kg   LMP 02/06/2016 (Approximate)   SpO2 100%   BMI  30.87 kg/m   Physical Exam  Constitutional: She is oriented to person, place, and time. She appears well-developed and well-nourished.  Non-toxic appearance.  HENT:  Head: Normocephalic.  Right Ear: Tympanic membrane and external ear normal.  Left Ear: Tympanic membrane and external ear normal.  Eyes: EOM and lids are normal. Pupils are equal, round, and reactive to light.  Neck: Normal range of motion. Neck supple. Carotid bruit is not present.  Cardiovascular: Normal rate, regular rhythm, normal heart sounds, intact distal pulses and normal pulses.   Pulmonary/Chest: Breath sounds normal. No respiratory distress.  Abdominal: Soft. Bowel sounds are normal. There is no tenderness. There is no guarding.  Abdomen is gravid  Musculoskeletal:  Normal range of motion.       Right foot: There is tenderness.       Feet:  Patient has a puncture wound at the plantar surface just under the first MP joint on the right foot. There is tenderness to palpation. There is minimal swelling noted. There is no red streaking appreciated. Dorsalis pedis pulses 2+.  Lymphadenopathy:       Head (right side): No submandibular adenopathy present.       Head (left side): No submandibular adenopathy present.    She has no cervical adenopathy.  Neurological: She is alert and oriented to person, place, and time. She has normal strength. No cranial nerve deficit or sensory deficit.  Skin: Skin is warm and dry.  Psychiatric: She has a normal mood and affect. Her speech is normal.  Nursing note and vitals reviewed.    ED Treatments / Results  Labs (all labs ordered are listed, but only abnormal results are displayed) Labs Reviewed - No data to display  EKG  EKG Interpretation None       Radiology Dg Foot Complete Right  Result Date: 10/04/2016 CLINICAL DATA:  Right foot pain and swelling after puncture wound. EXAM: RIGHT FOOT COMPLETE - 3+ VIEW COMPARISON:  None. FINDINGS: There is no evidence of fracture or dislocation. There is no evidence of arthropathy or other focal bone abnormality. Soft tissues are unremarkable. IMPRESSION: Negative. Electronically Signed   By: Kennith Center M.D.   On: 10/04/2016 19:29    Procedures .Foreign Body Removal Date/Time: 10/04/2016 8:44 PM Performed by: Ivery Quale Authorized by: Ivery Quale  Consent: Verbal consent obtained. Risks and benefits: risks, benefits and alternatives were discussed Consent given by: patient Patient understanding: patient states understanding of the procedure being performed Patient identity confirmed: verbally with patient Time out: Immediately prior to procedure a "time out" was called to verify the correct patient, procedure, equipment, support staff and site/side marked  as required. Body area: skin General location: lower extremity Location details: right foot  Sedation: Patient sedated: no Patient cooperative: yes Localization method: magnification Removal mechanism: forceps (18 guage needle) Dressing: dressing applied Tendon involvement: none Depth: subcutaneous Complexity: simple 1 objects recovered. Objects recovered: splinter Post-procedure assessment: foreign body removed Patient tolerance: Patient tolerated the procedure well with no immediate complications   (including critical care time)  Medications Ordered in ED Medications  cephALEXin (KEFLEX) capsule 500 mg (not administered)  ondansetron (ZOFRAN) tablet 4 mg (not administered)     Initial Impression / Assessment and Plan / ED Course  I have reviewed the triage vital signs and the nursing notes.  Pertinent labs & imaging results that were available during my care of the patient were reviewed by me and considered in my medical decision making (see chart for details).  Final Clinical Impressions(s) / ED Diagnoses MDM Vital signs within normal limits. X-ray of the right foot is negative for visible foreign body or any bone abnormality. Using magnification a small portion of a foreign body was seen in the foot. This was removed with forceps and 18-gauge needle. The splinter was removed. The area was cleansed. The patient is placed on Keflex. I've asked patient to continue to cleanse the area daily and apply a Band-Aid. She will follow-up with her primary physician or return to the emergency department if any changes or problems.    Final diagnoses:  None    New Prescriptions New Prescriptions   No medications on file     Ivery Quale, PA-C 10/04/16 2047    Lavera Guise, MD 10/05/16 1016

## 2016-10-04 NOTE — ED Triage Notes (Signed)
Pt reports stepping on something like a toothpick last night and now her right foot is hurting and swelling.  Pt is 8 months preg, denies any complaints and saw ob yesterday.

## 2016-10-15 ENCOUNTER — Telehealth: Payer: Self-pay | Admitting: Obstetrics & Gynecology

## 2016-10-15 NOTE — Telephone Encounter (Signed)
Pt called stating that she would like to speak with regarding a problem, Pt did not want to go into details. Please contact pt

## 2016-10-15 NOTE — Telephone Encounter (Signed)
Patient states she needs refill on PNV. Advised patient that she has 11 refills on last prescription. Advised patient to call pharmacy later this afternoon. Spoke to pharmacy and they will refill.

## 2016-10-17 ENCOUNTER — Encounter: Payer: Self-pay | Admitting: Advanced Practice Midwife

## 2016-10-17 ENCOUNTER — Ambulatory Visit (INDEPENDENT_AMBULATORY_CARE_PROVIDER_SITE_OTHER): Payer: Medicaid Other | Admitting: Advanced Practice Midwife

## 2016-10-17 VITALS — BP 110/60 | HR 111 | Wt 205.5 lb

## 2016-10-17 DIAGNOSIS — Z1389 Encounter for screening for other disorder: Secondary | ICD-10-CM | POA: Diagnosis not present

## 2016-10-17 DIAGNOSIS — Z3A32 32 weeks gestation of pregnancy: Secondary | ICD-10-CM

## 2016-10-17 DIAGNOSIS — Z331 Pregnant state, incidental: Secondary | ICD-10-CM | POA: Diagnosis not present

## 2016-10-17 DIAGNOSIS — Z3483 Encounter for supervision of other normal pregnancy, third trimester: Secondary | ICD-10-CM

## 2016-10-17 LAB — POCT URINALYSIS DIPSTICK
GLUCOSE UA: NEGATIVE
KETONES UA: NEGATIVE
Nitrite, UA: NEGATIVE
Protein, UA: NEGATIVE
RBC UA: NEGATIVE

## 2016-10-17 MED ORDER — PNV PRENATAL PLUS MULTIVITAMIN 27-1 MG PO TABS: 1.0000 | ORAL_TABLET | Freq: Every day | ORAL | 11 refills | Status: DC

## 2016-10-17 NOTE — Addendum Note (Signed)
Addended by: Colen DarlingYOUNG, Martyna Thorns S on: 10/17/2016 03:20 PM   Modules accepted: Orders

## 2016-10-17 NOTE — Progress Notes (Signed)
G2P0010 953w6d Estimated Date of Delivery: 12/06/16  Last menstrual period 02/06/2016.   BP weight and urine results all reviewed and noted.  Please refer to the obstetrical flow sheet for the fundal height and fetal heart rate documentation:  Patient reports good fetal movement, denies any bleeding and no rupture of membranes symptoms or regular contractions. Patient is without complaints. All questions were answered.  No orders of the defined types were placed in this encounter. Stepped on a toothpick 2 weeks ago, had it removed/cleaned out and was on Keflex.  Today, R foot medial aspect of ball of foot pink, warm, edematous. Swelling much improved per pt, non-tender.  Plan:  Continued routine obstetrical care, RTC in 2 weeks.  Jonetta Speakarol Maleea Camilo

## 2016-10-18 LAB — PMP SCREEN PROFILE (10S), URINE
AMPHETAMINE SCREEN URINE: NEGATIVE ng/mL
BARBITURATE SCREEN URINE: NEGATIVE ng/mL
BENZODIAZEPINE SCREEN, URINE: NEGATIVE ng/mL
CANNABINOIDS UR QL SCN: POSITIVE ng/mL
Cocaine (Metab) Scrn, Ur: POSITIVE ng/mL
Creatinine(Crt), U: 226.8 mg/dL (ref 20.0–300.0)
METHADONE SCREEN, URINE: NEGATIVE ng/mL
OPIATE SCREEN URINE: NEGATIVE ng/mL
OXYCODONE+OXYMORPHONE UR QL SCN: NEGATIVE ng/mL
Ph of Urine: 6 (ref 4.5–8.9)
Phencyclidine Qn, Ur: NEGATIVE ng/mL
Propoxyphene Scrn, Ur: NEGATIVE ng/mL

## 2016-10-19 LAB — URINALYSIS, ROUTINE W REFLEX MICROSCOPIC
Bilirubin, UA: NEGATIVE
Glucose, UA: NEGATIVE
KETONES UA: NEGATIVE
NITRITE UA: NEGATIVE
Protein, UA: NEGATIVE
RBC, UA: NEGATIVE
SPEC GRAV UA: 1.021 (ref 1.005–1.030)
Urobilinogen, Ur: 1 mg/dL (ref 0.2–1.0)
pH, UA: 6 (ref 5.0–7.5)

## 2016-10-19 LAB — MICROSCOPIC EXAMINATION: Casts: NONE SEEN /lpf

## 2016-10-19 LAB — HEPATITIS B SURFACE ANTIGEN: Hepatitis B Surface Ag: NEGATIVE

## 2016-10-19 LAB — SICKLE CELL SCREEN: SICKLE CELL SCREEN: NEGATIVE

## 2016-10-19 LAB — CBC
HEMOGLOBIN: 11.6 g/dL (ref 11.1–15.9)
Hematocrit: 35.9 % (ref 34.0–46.6)
MCH: 27.1 pg (ref 26.6–33.0)
MCHC: 32.3 g/dL (ref 31.5–35.7)
MCV: 84 fL (ref 79–97)
PLATELETS: 296 10*3/uL (ref 150–379)
RBC: 4.28 x10E6/uL (ref 3.77–5.28)
RDW: 14.1 % (ref 12.3–15.4)
WBC: 5.9 10*3/uL (ref 3.4–10.8)

## 2016-10-19 LAB — ABO/RH: Rh Factor: POSITIVE

## 2016-10-19 LAB — ANTIBODY SCREEN: Antibody Screen: NEGATIVE

## 2016-10-19 LAB — RUBELLA SCREEN: Rubella Antibodies, IGG: 1.79 index (ref >0.99)

## 2016-10-19 LAB — HIV ANTIBODY (ROUTINE TESTING W REFLEX): HIV SCREEN 4TH GENERATION: NONREACTIVE

## 2016-10-19 LAB — RPR: RPR: NONREACTIVE

## 2016-10-19 LAB — VARICELLA ZOSTER ANTIBODY, IGG: VARICELLA: 187 {index} (ref >165)

## 2016-10-20 ENCOUNTER — Encounter: Payer: Self-pay | Admitting: Advanced Practice Midwife

## 2016-10-20 DIAGNOSIS — F1410 Cocaine abuse, uncomplicated: Secondary | ICD-10-CM | POA: Insufficient documentation

## 2016-10-31 ENCOUNTER — Encounter: Payer: Self-pay | Admitting: Obstetrics and Gynecology

## 2016-10-31 ENCOUNTER — Ambulatory Visit (INDEPENDENT_AMBULATORY_CARE_PROVIDER_SITE_OTHER): Payer: Medicaid Other | Admitting: Obstetrics and Gynecology

## 2016-10-31 VITALS — BP 140/82 | HR 95 | Wt 209.0 lb

## 2016-10-31 DIAGNOSIS — Z3483 Encounter for supervision of other normal pregnancy, third trimester: Secondary | ICD-10-CM | POA: Diagnosis not present

## 2016-10-31 DIAGNOSIS — Z87898 Personal history of other specified conditions: Secondary | ICD-10-CM

## 2016-10-31 DIAGNOSIS — Z1389 Encounter for screening for other disorder: Secondary | ICD-10-CM | POA: Diagnosis not present

## 2016-10-31 DIAGNOSIS — Z331 Pregnant state, incidental: Secondary | ICD-10-CM

## 2016-10-31 DIAGNOSIS — F1991 Other psychoactive substance use, unspecified, in remission: Secondary | ICD-10-CM

## 2016-10-31 LAB — POCT URINALYSIS DIPSTICK
GLUCOSE UA: NEGATIVE
Ketones, UA: NEGATIVE
LEUKOCYTES UA: NEGATIVE
Nitrite, UA: NEGATIVE
Protein, UA: NEGATIVE
RBC UA: NEGATIVE

## 2016-10-31 NOTE — Patient Instructions (Signed)
(  [phone] is the phone number for Pregnancy Classes or hospital tours at [facility].  ° °You will be referred to  [email or web address]   for more information on childbirth classes   °At this site you may register for classes. You may sign up for a waiting list if classes are full. Please SIGN UP FOR THIS!.   When the waiting list becomes long, sometimes new classes can be added. ° ° ° °

## 2016-10-31 NOTE — Progress Notes (Signed)
Patient ID: Crystal EhlersElexus Hines, female   DOB: May 09, 1996, [age] y.o.   MRN: [id number]  G2P0010  [facility]  Chief Complaint  Patient presents with   Routine Prenatal Visit  ____  Patient complaints: none. She states she did not attend childbirth classes.   Patient reports good fetal movement. She denies any bleeding, rupture of membranes,or regular contractions.  Blood pressure 140/82, pulse 95, weight 209 lb (94.8 kg), last menstrual period 02/06/2016.   Urine results:notable for none  refer to the ob flow sheet for FH and FHR,                           Physical Examination: General appearance - alert, well appearing, and in no distress                                      Abdomen - FH 34 cm                                                        -FHR 123 bpm                                                         soft, nontender, nondistended, no masses or organomegaly  Questions were answered. Assessment: LROB G2P0010 @ 3415w6d  Recommended [facility] tour, number provided with AVS  Plan:  Continued routine obstetrical care  F/u in 2 weeks for routine prenatal care   By signing my name below, I, Doreatha MartinEva Mathews, attest that this documentation has been prepared under the direction and in the presence of Tilda BurrowFerguson, John V, MD. Electronically Signed: Doreatha MartinEva Mathews, ED Scribe. 10/31/16. 2:17 PM.  I personally performed the services described in this documentation, which was SCRIBED in my presence. The recorded information has been reviewed and considered accurate. It has been edited as necessary during review. Tilda BurrowFERGUSON,JOHN V, MD

## 2016-11-01 LAB — PMP SCREEN PROFILE (10S), URINE
Amphetamine Scrn, Ur: NEGATIVE ng/mL
BARBITURATE SCREEN URINE: NEGATIVE ng/mL
BENZODIAZEPINE SCREEN, URINE: NEGATIVE ng/mL
CANNABINOIDS UR QL SCN: NEGATIVE ng/mL
COCAINE(METAB.)SCREEN, URINE: NEGATIVE ng/mL
Creatinine(Crt), U: 78 mg/dL (ref 20.0–300.0)
Methadone Screen, Urine: NEGATIVE ng/mL
OPIATE SCREEN URINE: NEGATIVE ng/mL
OXYCODONE+OXYMORPHONE UR QL SCN: NEGATIVE ng/mL
Ph of Urine: 6.6 (ref 4.5–8.9)
Phencyclidine Qn, Ur: NEGATIVE ng/mL
Propoxyphene Scrn, Ur: NEGATIVE ng/mL

## 2016-11-07 ENCOUNTER — Telehealth: Payer: Self-pay | Admitting: *Deleted

## 2016-11-07 ENCOUNTER — Inpatient Hospital Stay (HOSPITAL_COMMUNITY)
Admission: AD | Admit: 2016-11-07 | Discharge: 2016-11-07 | Disposition: A | Discharge status: Home / Self Care | Payer: Medicaid Other | Source: Ambulatory Visit | Attending: Obstetrics and Gynecology | Admitting: Obstetrics and Gynecology

## 2016-11-07 ENCOUNTER — Encounter (HOSPITAL_COMMUNITY): Payer: Self-pay

## 2016-11-07 ENCOUNTER — Encounter: Payer: Self-pay | Admitting: Women's Health

## 2016-11-07 ENCOUNTER — Ambulatory Visit (INDEPENDENT_AMBULATORY_CARE_PROVIDER_SITE_OTHER): Payer: Medicaid Other | Admitting: Women's Health

## 2016-11-07 VITALS — BP 150/82 | HR 84 | Wt 208.0 lb

## 2016-11-07 DIAGNOSIS — O99323 Drug use complicating pregnancy, third trimester: Secondary | ICD-10-CM

## 2016-11-07 DIAGNOSIS — Z331 Pregnant state, incidental: Secondary | ICD-10-CM | POA: Diagnosis not present

## 2016-11-07 DIAGNOSIS — O0993 Supervision of high risk pregnancy, unspecified, third trimester: Secondary | ICD-10-CM | POA: Diagnosis not present

## 2016-11-07 DIAGNOSIS — Z87891 Personal history of nicotine dependence: Secondary | ICD-10-CM | POA: Insufficient documentation

## 2016-11-07 DIAGNOSIS — Z1389 Encounter for screening for other disorder: Secondary | ICD-10-CM | POA: Diagnosis not present

## 2016-11-07 DIAGNOSIS — Z3A00 Weeks of gestation of pregnancy not specified: Secondary | ICD-10-CM | POA: Insufficient documentation

## 2016-11-07 DIAGNOSIS — R109 Unspecified abdominal pain: Secondary | ICD-10-CM | POA: Diagnosis present

## 2016-11-07 DIAGNOSIS — F1410 Cocaine abuse, uncomplicated: Secondary | ICD-10-CM | POA: Diagnosis not present

## 2016-11-07 DIAGNOSIS — O139 Gestational [pregnancy-induced] hypertension without significant proteinuria, unspecified trimester: Secondary | ICD-10-CM | POA: Diagnosis not present

## 2016-11-07 DIAGNOSIS — O133 Gestational [pregnancy-induced] hypertension without significant proteinuria, third trimester: Secondary | ICD-10-CM

## 2016-11-07 DIAGNOSIS — I10 Essential (primary) hypertension: Secondary | ICD-10-CM | POA: Diagnosis present

## 2016-11-07 HISTORY — DX: Other specified health status: Z78.9

## 2016-11-07 LAB — POCT URINALYSIS DIPSTICK
Glucose, UA: NEGATIVE
Ketones, UA: NEGATIVE
NITRITE UA: NEGATIVE
RBC UA: NEGATIVE

## 2016-11-07 LAB — CBC
HCT: 36.4 % (ref 36.0–46.0)
Hemoglobin: 12 g/dL (ref 12.0–15.0)
MCH: 27.4 pg (ref 26.0–34.0)
MCHC: 33 g/dL (ref 30.0–36.0)
MCV: 83.1 fL (ref 78.0–100.0)
Platelets: 230 K/uL (ref 150–400)
RBC: 4.38 MIL/uL (ref 3.87–5.11)
RDW: 14.7 % (ref 11.5–15.5)
WBC: 5.8 K/uL (ref 4.0–10.5)

## 2016-11-07 LAB — COMPREHENSIVE METABOLIC PANEL WITH GFR
ALT: 38 U/L (ref 14–54)
AST: 28 U/L (ref 15–41)
Albumin: 3.4 g/dL — ABNORMAL LOW (ref 3.5–5.0)
Alkaline Phosphatase: 108 U/L (ref 38–126)
Anion gap: 8 (ref 5–15)
BUN: 7 mg/dL (ref 6–20)
CO2: 21 mmol/L — ABNORMAL LOW (ref 22–32)
Calcium: 8.3 mg/dL — ABNORMAL LOW (ref 8.9–10.3)
Chloride: 101 mmol/L (ref 101–111)
Creatinine, Ser: 0.67 mg/dL (ref 0.44–1.00)
GFR calc Af Amer: >60 mL/min
GFR calc non Af Amer: >60 mL/min
Glucose, Bld: 84 mg/dL (ref 65–99)
Potassium: 3.8 mmol/L (ref 3.5–5.1)
Sodium: 130 mmol/L — ABNORMAL LOW (ref 135–145)
Total Bilirubin: 0.4 mg/dL (ref 0.3–1.2)
Total Protein: 7.4 g/dL (ref 6.5–8.1)

## 2016-11-07 LAB — PROTEIN / CREATININE RATIO, URINE
Creatinine, Urine: 225 mg/dL
PROTEIN CREATININE RATIO: 0.07 mg/mg{creat} (ref 0.00–0.15)
TOTAL PROTEIN, URINE: 16 mg/dL

## 2016-11-07 LAB — RAPID URINE DRUG SCREEN, HOSP PERFORMED
Amphetamines: NOT DETECTED
Barbiturates: NOT DETECTED
Benzodiazepines: NOT DETECTED
Cocaine: NOT DETECTED
Opiates: NOT DETECTED
Tetrahydrocannabinol: NOT DETECTED

## 2016-11-07 NOTE — MAU Note (Signed)
Pt sent from MD office, BP was "sky high."  Having lower abd pain, is seeing spots, denies HA.  Denies bleeding or LOF.

## 2016-11-07 NOTE — Telephone Encounter (Signed)
Patient states she did not need anything. She was confused. Advised to call if she needed anything else.

## 2016-11-07 NOTE — Progress Notes (Signed)
High Risk Pregnancy Diagnosis(es): GHTN/poss pre-e vs. Poss CHTN G2P0010 [id number] Estimated Date of Delivery: 12/06/16 BP (!) 150/82   Pulse 84   Wt 208 lb (94.3 kg)   LMP 02/06/2016 (Approximate)   BMI 31.63 kg/m   Urinalysis: Positive for tr protein, sm leuks HPI:  Checked bp at [employer] yesterday and was 'high'. Seeing black spots/floaters for past few days. Denies headache, epigastric/ruq pain, n/v. Denies h/o CHTN. Had elevated bp at new ob visit at 11wks: 155/80, 140/80 @ 27wks, 140/82 last week.  UDS + for cocaine and THC 3 wks ago, neg for both last week. States she has never used cocaine, was in the same car and someone smoking 'Kerry HoughWoo' which she states is THC laced w/ cocaine. Reports no THC since ~486mths of pregnancy, used to help w/ nausea/appetite. Advised no further use of any illicit drugs during pregnancy, do not be around anyone smoking 'Woo'. Discussed SW will visit w/ her in hospital and they will also do drug screen on baby.  BP, weight, and urine reviewed.  Reports good fm. Denies regular uc's, lof, vb, uti s/s.   Fundal Height:  34 Fetal Heart rate:  133 Edema:  None DTRs 2+, no clonus  Reviewed ptl s/s, pre-e s/s, fkc All questions were answered Assessment: [id number] GHTN/poss pre-e vs. Poss CHTN Medication(s) Plans:  None for now Treatment Plan:  To [facility]U for pre-e eval, notified J Rasch, NP- will also send urine for UDS at hospital Follow up in 2d for high-risk OB appt and bpp/dopp/efw/afi u/s if d/c'd

## 2016-11-07 NOTE — Patient Instructions (Addendum)
Call the office ([phone]) or go to [facility] if:  You begin to have strong, frequent contractions  Your water breaks.  Sometimes it is a big gush of fluid, sometimes it is just a trickle that keeps getting your panties wet or running down your legs  You have vaginal bleeding.  It is normal to have a small amount of spotting if your cervix was checked.   You don't feel your baby moving like normal.  If you don't, get you something to eat and drink and lay down and focus on feeling your baby move.  You should feel at least 10 movements in 2 hours.  If you don't, you should call the office or go to [facility].    Call the office ([phone]) or go to [facility] for these signs of pre-eclampsia:  Severe headache that does not go away with Tylenol  Visual changes- seeing spots, double, blurred vision  Pain under your right breast or upper abdomen that does not go away with Tums or heartburn medicine  Nausea and/or vomiting  Severe swelling in your hands, feet, and face    Hypertension During Pregnancy Hypertension, commonly called high blood pressure, is when the force of blood pumping through your arteries is too strong. Arteries are blood vessels that carry blood from the heart throughout the body. Hypertension during pregnancy can cause problems for you and your baby. Your baby may be born early (prematurely) or may not weigh as much as he or she should at birth. Very bad cases of hypertension during pregnancy can be life-threatening. Different types of hypertension can occur during pregnancy. These include:  Chronic hypertension. This happens when:  You have hypertension before pregnancy and it continues during pregnancy.  You develop hypertension before you are [redacted] weeks pregnant, and it continues during pregnancy.  Gestational hypertension. This is hypertension that develops after the 20th week of pregnancy.  Preeclampsia, also called toxemia of pregnancy. This is  a very serious type of hypertension that develops only during pregnancy. It affects the whole body, and it can be very dangerous for you and your baby. Gestational hypertension and preeclampsia usually go away within 6 weeks after your baby is born. Women who have hypertension during pregnancy have a greater chance of developing hypertension later in life or during future pregnancies. What are the causes? The exact cause of hypertension is not known. What increases the risk? There are certain factors that make it more likely for you to develop hypertension during pregnancy. These include:  Having hypertension during a previous pregnancy or prior to pregnancy.  Being overweight.  Being older than age 40.  Being pregnant for the first time or being pregnant with more than one baby.  Becoming pregnant using fertilization methods such as IVF (in vitro fertilization).  Having diabetes, kidney problems, or systemic lupus erythematosus.  Having a family history of hypertension. What are the signs or symptoms? Chronic hypertension and gestational hypertension rarely cause symptoms. Preeclampsia causes symptoms, which may include:  Increased protein in your urine. Your health care provider will check for this at every visit before you give birth (prenatal visit).  Severe headaches.  Sudden weight gain.  Swelling of the hands, face, legs, and feet.  Nausea and vomiting.  Vision problems, such as blurred or double vision.  Numbness in the face, arms, legs, and feet.  Dizziness.  Slurred speech.  Sensitivity to bright lights.  Abdominal pain.  Convulsions. How is this diagnosed? You may be diagnosed   with hypertension during a routine prenatal exam. At each prenatal visit, you may:  Have a urine test to check for high amounts of protein in your urine.  Have your blood pressure checked. A blood pressure reading is recorded as two numbers, such as "120 over 80" (or 120/80). The  first ("top") number is called the systolic pressure. It is a measure of the pressure in your arteries when your heart beats. The second ("bottom") number is called the diastolic pressure. It is a measure of the pressure in your arteries as your heart relaxes between beats. Blood pressure is measured in a unit called mm Hg. A normal blood pressure reading is:  Systolic: below 120.  Diastolic: below 80. The type of hypertension that you are diagnosed with depends on your test results and when your symptoms developed.  Chronic hypertension is usually diagnosed before 20 weeks of pregnancy.  Gestational hypertension is usually diagnosed after 20 weeks of pregnancy.  Hypertension with high amounts of protein in the urine is diagnosed as preeclampsia.  Blood pressure measurements that stay above 160 systolic, or above 110 diastolic, are signs of severe preeclampsia. How is this treated? Treatment for hypertension during pregnancy varies depending on the type of hypertension you have and how serious it is.  If you take medicines called ACE inhibitors to treat chronic hypertension, you may need to switch medicines. ACE inhibitors should not be taken during pregnancy.  If you have gestational hypertension, you may need to take blood pressure medicine.  If you are at risk for preeclampsia, your health care provider may recommend that you take a low-dose aspirin every day to prevent high blood pressure during your pregnancy.  If you have severe preeclampsia, you may need to be hospitalized so you and your baby can be monitored closely. You may also need to take medicine (magnesium sulfate) to prevent seizures and to lower blood pressure. This medicine may be given as an injection or through an IV tube.  In some cases, if your condition gets worse, you may need to deliver your baby early. Follow these instructions at home: Eating and drinking  Drink enough fluid to keep your urine clear or pale  yellow.  Eat a healthy diet that is low in salt (sodium). Do not add salt to your food. Check food labels to see how much sodium a food or beverage contains. Lifestyle  Do not use any products that contain nicotine or tobacco, such as cigarettes and e-cigarettes. If you need help quitting, ask your health care provider.  Do not use alcohol.  Avoid caffeine.  Avoid stress as much as possible. Rest and get plenty of sleep. General instructions  Take over-the-counter and prescription medicines only as told by your health care provider.  While lying down, lie on your left side. This keeps pressure off your baby.  While sitting or lying down, raise (elevate) your feet. Try putting some pillows under your lower legs.  Exercise regularly. Ask your health care provider what kinds of exercise are best for you.  Keep all prenatal and follow-up visits as told by your health care provider. This is important. Contact a health care provider if:  You have symptoms that your health care provider told you may require more treatment or monitoring, such as:  Fever.  Vomiting.  Headache. Get help right away if:  You have severe abdominal pain or vomiting that does not get better with treatment.  You suddenly develop swelling in your hands, ankles, or   face.  You gain 4 lbs (1.8 kg) or more in 1 week.  You develop vaginal bleeding, or you have blood in your urine.  You do not feel your baby moving as much as usual.  You have blurred or double vision.  You have muscle twitching or sudden tightening (spasms).  You have shortness of breath.  Your lips or fingernails turn blue. This information is not intended to replace advice given to you by your health care provider. Make sure you discuss any questions you have with your health care provider. Document Released: 02/13/2011 Document Revised: 12/16/2015 Document Reviewed: 11/11/2015 Elsevier Interactive Patient Education  2017 Elsevier  Inc.     

## 2016-11-07 NOTE — [facility] Provider Note (Signed)
Subjective:  Patient ID: Crystal EhlersElexus Hines, female    DOB: 07-Jul-1995  Age: 21 y.o. MRN: [id number]  CC: Hypertension and Abdominal Pain   HPI Crystal Hines is a 21 y.o. African American [id number]  female who presents for preeclampsia r/o. Patient [facility] earlier this morning where they measured her BP to be elevated to 150/82. Patient also reported to them that for the past week she has been seeing black spots and floaters. [facility] sent the patient over to [facility] for preeclampsia evaluation.  Patient reports that she has been seeing black spots and floaters for about a week. That appear spontaneously and tend to last for 10 minutes at a time before they cease. Patient denies any dizziness, abdominal pain, headache, or swelling.   Past Medical History:  Diagnosis Date  . Medical history non-contributory   . Pregnant 08/23/2015   No current facility-administered medications on file prior to encounter.    Current Outpatient Prescriptions on File Prior to Encounter  Medication Sig Dispense Refill  . Prenatal Vit-Fe Fumarate-FA (PNV PRENATAL PLUS MULTIVITAMIN) 27-1 MG TABS Take 1 tablet by mouth daily. 30 tablet 11    Past Surgical History:  Procedure Laterality Date  . NO PAST SURGERIES      Family History  Problem Relation Age of Onset  . Hypertension Mother   . Hypertension Father   . Other Father        blood clots  . Hypertension Maternal Grandmother   . Hypertension Maternal Grandfather   . Congestive Heart Failure Paternal Grandfather     Social History  Substance Use Topics  . Smoking status: Former Smoker    Years: 2.00    Types: Cigarettes  . Smokeless tobacco: Never Used  . Alcohol use No  Patient uses Marijuana, but is not able to quantify how much she uses.    ROS Review of Systems  Constitutional: Negative for chills and fever.  Respiratory: Negative for shortness of breath.   Cardiovascular: Negative for chest pain and leg  swelling.  Gastrointestinal: Negative for abdominal pain, nausea and vomiting.  Neurological: Negative for dizziness, light-headedness and headaches.    Objective:   Today's Vitals: BP (!) 126/55   Pulse 82   Temp 97.9 F (36.6 C)   Resp 18   LMP 02/06/2016 (Approximate)   SpO2 100%   Physical Exam  Constitutional: She is oriented to person, place, and time. She appears well-developed and well-nourished.  HENT:  Head: Normocephalic and atraumatic.  Cardiovascular: Normal rate and regular rhythm.  Exam reveals no gallop and no friction rub.   No murmur heard. Pulmonary/Chest: Effort normal and breath sounds normal. She has no wheezes.  Abdominal: There is no tenderness.  Gravid  Neurological: She is alert and oriented to person, place, and time. She has normal reflexes. She displays normal reflexes.  Negative clonus    Fetal Heart rate: baseline 125 bpm, + accels, no decels, no contractions.    Results for orders placed or performed during the hospital encounter of 11/07/16 (from the past 24 hour(s))  Protein / creatinine ratio, urine     Status: None   Collection Time: 11/07/16 12:31 PM  Result Value Ref Range   Creatinine, Urine 225.00 mg/dL   Total Protein, Urine 16 mg/dL   Protein Creatinine Ratio 0.07 0.00 - 0.15 mg/mg[Cre]  Urine rapid drug screen (hosp performed)     Status: None   Collection Time: 11/07/16 12:31 PM  Result Value  Ref Range   Opiates NONE DETECTED NONE DETECTED   Cocaine NONE DETECTED NONE DETECTED   Benzodiazepines NONE DETECTED NONE DETECTED   Amphetamines NONE DETECTED NONE DETECTED   Tetrahydrocannabinol NONE DETECTED NONE DETECTED   Barbiturates NONE DETECTED NONE DETECTED  CBC     Status: None   Collection Time: 11/07/16 12:32 PM  Result Value Ref Range   WBC 5.8 4.0 - 10.5 K/uL   RBC 4.38 3.87 - 5.11 MIL/uL   Hemoglobin 12.0 12.0 - 15.0 g/dL   HCT 81.1 91.4 - 78.2 %   MCV 83.1 78.0 - 100.0 fL   MCH 27.4 26.0 - 34.0 pg   MCHC 33.0  30.0 - 36.0 g/dL   RDW 95.6 21.3 - 08.6 %   Platelets 230 150 - 400 K/uL  Comprehensive metabolic panel     Status: Abnormal   Collection Time: 11/07/16 12:32 PM  Result Value Ref Range   Sodium 130 (L) 135 - 145 mmol/L   Potassium 3.8 3.5 - 5.1 mmol/L   Chloride 101 101 - 111 mmol/L   CO2 21 (L) 22 - 32 mmol/L   Glucose, Bld 84 65 - 99 mg/dL   BUN 7 6 - 20 mg/dL   Creatinine, Ser 5.78 0.44 - 1.00 mg/dL   Calcium 8.3 (L) 8.9 - 10.3 mg/dL   Total Protein 7.4 6.5 - 8.1 g/dL   Albumin 3.4 (L) 3.5 - 5.0 g/dL   AST 28 15 - 41 U/L   ALT 38 14 - 54 U/L   Alkaline Phosphatase 108 38 - 126 U/L   Total Bilirubin 0.4 0.3 - 1.2 mg/dL   GFR calc non Af Amer >60 >60 mL/min   GFR calc Af Amer >60 >60 mL/min   Anion gap 8 5 - 15     Assessment & Plan:   Crystal Hines is a 21 y.o.  [id number] African American female with a UPC of 0.07, intermittently elevated BP, vision changes for the past week, and normal LFTs who presents today for preeclampsia work up. Patient does not meet the criteria for preeclampsia due to absent severe features normal UPC. Due to multiple intermittently high blood pressures over the course of her pregnancy she does intact have gestation hypertension.   #Gestational Hypertension As explained above, based on her labs and symptoms patient has gestational hypertension and is stable for discharge. We would like her to follow up with her outpatient provider in 48 hours for a BP check in the office. - Follow up with [facility] - BP recheck Friday - strict return precautions  -encouraged appointment with opthalmology  - preeclampsia precautions    Follow-up: No Follow-up on file.    I confirm that I have verified the information documented in the Medical students note and that I have also personally performed the physical exam and all medical decision making activities.   Venia Carbon I, NP 11/07/2016 3:19 PM

## 2016-11-07 NOTE — Discharge Instructions (Signed)
Hypertension During Pregnancy Hypertension is also called high blood pressure. High blood pressure means that the force of your blood moving in your body is too strong. When you are pregnant, this condition should be watched carefully. It can cause problems for you and your baby. Follow these instructions at home: Eating and drinking  Drink enough fluid to keep your pee (urine) clear or pale yellow.  Eat healthy foods that are low in salt (sodium). ? Do not add salt to your food. ? Check labels on foods and drinks to see much salt is in them. Look on the label where you see "Sodium." Lifestyle  Do not use any products that contain nicotine or tobacco, such as cigarettes and e-cigarettes. If you need help quitting, ask your doctor.  Do not use alcohol.  Avoid caffeine.  Avoid stress. Rest and get plenty of sleep. General instructions  Take over-the-counter and prescription medicines only as told by your doctor.  While lying down, lie on your left side. This keeps pressure off your baby.  While sitting or lying down, raise (elevate) your feet. Try putting some pillows under your lower legs.  Exercise regularly. Ask your doctor what kinds of exercise are best for you.  Keep all prenatal and follow-up visits as told by your doctor. This is important. Contact a doctor if:  You have symptoms that your doctor told you to watch for, such as: ? Fever. ? Throwing up (vomiting). ? Headache. Get help right away if:  You have very bad pain in your belly (abdomen).  You are throwing up, and this does not get better with treatment.  You suddenly get swelling in your hands, ankles, or face.  You gain 4 lb (1.8 kg) or more in 1 week.  You get bleeding from your vagina.  You have blood in your pee.  You do not feel your baby moving as much as normal.  You have a change in vision.  You have muscle twitching or sudden tightening (spasms).  You have trouble breathing.  Your lips  or fingernails turn blue. This information is not intended to replace advice given to you by your health care provider. Make sure you discuss any questions you have with your health care provider. Document Released: 06/30/2010 Document Revised: 02/07/2016 Document Reviewed: 02/07/2016 Elsevier Interactive Patient Education  2017 [employer] Inc.  

## 2016-11-09 ENCOUNTER — Encounter: Payer: Self-pay | Admitting: Women's Health

## 2016-11-09 ENCOUNTER — Ambulatory Visit (INDEPENDENT_AMBULATORY_CARE_PROVIDER_SITE_OTHER): Payer: Medicaid Other | Admitting: Women's Health

## 2016-11-09 ENCOUNTER — Ambulatory Visit (INDEPENDENT_AMBULATORY_CARE_PROVIDER_SITE_OTHER): Payer: Medicaid Other

## 2016-11-09 VITALS — BP 138/78 | HR 96 | Wt 209.0 lb

## 2016-11-09 DIAGNOSIS — O133 Gestational [pregnancy-induced] hypertension without significant proteinuria, third trimester: Secondary | ICD-10-CM

## 2016-11-09 DIAGNOSIS — Z1389 Encounter for screening for other disorder: Secondary | ICD-10-CM

## 2016-11-09 DIAGNOSIS — O0993 Supervision of high risk pregnancy, unspecified, third trimester: Secondary | ICD-10-CM

## 2016-11-09 DIAGNOSIS — Z331 Pregnant state, incidental: Secondary | ICD-10-CM | POA: Diagnosis not present

## 2016-11-09 DIAGNOSIS — F1410 Cocaine abuse, uncomplicated: Secondary | ICD-10-CM

## 2016-11-09 DIAGNOSIS — O0990 Supervision of high risk pregnancy, unspecified, unspecified trimester: Secondary | ICD-10-CM

## 2016-11-09 LAB — POCT URINALYSIS DIPSTICK
Glucose, UA: NEGATIVE
Ketones, UA: NEGATIVE
NITRITE UA: NEGATIVE
Protein, UA: NEGATIVE
RBC UA: NEGATIVE

## 2016-11-09 NOTE — Patient Instructions (Signed)
Call the office ([phone]) or go to [facility] if:  You begin to have strong, frequent contractions  Your water breaks.  Sometimes it is a big gush of fluid, sometimes it is just a trickle that keeps getting your panties wet or running down your legs  You have vaginal bleeding.  It is normal to have a small amount of spotting if your cervix was checked.   You don't feel your baby moving like normal.  If you don't, get you something to eat and drink and lay down and focus on feeling your baby move.  You should feel at least 10 movements in 2 hours.  If you don't, you should call the office or go to [facility].     Call the office ([phone]) or go to [facility] for these signs of pre-eclampsia:  Severe headache that does not go away with Tylenol  Visual changes- seeing spots, double, blurred vision  Pain under your right breast or upper abdomen that does not go away with Tums or heartburn medicine  Nausea and/or vomiting  Severe swelling in your hands, feet, and face     Hypertension During Pregnancy Hypertension, commonly called high blood pressure, is when the force of blood pumping through your arteries is too strong. Arteries are blood vessels that carry blood from the heart throughout the body. Hypertension during pregnancy can cause problems for you and your baby. Your baby may be born early (prematurely) or may not weigh as much as he or she should at birth. Very bad cases of hypertension during pregnancy can be life-threatening. Different types of hypertension can occur during pregnancy. These include:  Chronic hypertension. This happens when: ? You have hypertension before pregnancy and it continues during pregnancy. ? You develop hypertension before you are [redacted] weeks pregnant, and it continues during pregnancy.  Gestational hypertension. This is hypertension that develops after the 20th week of pregnancy.  Preeclampsia, also called toxemia of pregnancy. This  is a very serious type of hypertension that develops only during pregnancy. It affects the whole body, and it can be very dangerous for you and your baby.  Gestational hypertension and preeclampsia usually go away within 6 weeks after your baby is born. Women who have hypertension during pregnancy have a greater chance of developing hypertension later in life or during future pregnancies. What are the causes? The exact cause of hypertension is not known. What increases the risk? There are certain factors that make it more likely for you to develop hypertension during pregnancy. These include:  Having hypertension during a previous pregnancy or prior to pregnancy.  Being overweight.  Being older than age 40.  Being pregnant for the first time or being pregnant with more than one baby.  Becoming pregnant using fertilization methods such as IVF (in vitro fertilization).  Having diabetes, kidney problems, or systemic lupus erythematosus.  Having a family history of hypertension.  What are the signs or symptoms? Chronic hypertension and gestational hypertension rarely cause symptoms. Preeclampsia causes symptoms, which may include:  Increased protein in your urine. Your health care provider will check for this at every visit before you give birth (prenatal visit).  Severe headaches.  Sudden weight gain.  Swelling of the hands, face, legs, and feet.  Nausea and vomiting.  Vision problems, such as blurred or double vision.  Numbness in the face, arms, legs, and feet.  Dizziness.  Slurred speech.  Sensitivity to bright lights.  Abdominal pain.  Convulsions.  How is this   diagnosed? You may be diagnosed with hypertension during a routine prenatal exam. At each prenatal visit, you may:  Have a urine test to check for high amounts of protein in your urine.  Have your blood pressure checked. A blood pressure reading is recorded as two numbers, such as "120 over 80" (or  120/80). The first ("top") number is called the systolic pressure. It is a measure of the pressure in your arteries when your heart beats. The second ("bottom") number is called the diastolic pressure. It is a measure of the pressure in your arteries as your heart relaxes between beats. Blood pressure is measured in a unit called mm Hg. A normal blood pressure reading is: ? Systolic: below 120. ? Diastolic: below 80.  The type of hypertension that you are diagnosed with depends on your test results and when your symptoms developed.  Chronic hypertension is usually diagnosed before 20 weeks of pregnancy.  Gestational hypertension is usually diagnosed after 20 weeks of pregnancy.  Hypertension with high amounts of protein in the urine is diagnosed as preeclampsia.  Blood pressure measurements that stay above 160 systolic, or above 110 diastolic, are signs of severe preeclampsia.  How is this treated? Treatment for hypertension during pregnancy varies depending on the type of hypertension you have and how serious it is.  If you take medicines called ACE inhibitors to treat chronic hypertension, you may need to switch medicines. ACE inhibitors should not be taken during pregnancy.  If you have gestational hypertension, you may need to take blood pressure medicine.  If you are at risk for preeclampsia, your health care provider may recommend that you take a low-dose aspirin every day to prevent high blood pressure during your pregnancy.  If you have severe preeclampsia, you may need to be hospitalized so you and your baby can be monitored closely. You may also need to take medicine (magnesium sulfate) to prevent seizures and to lower blood pressure. This medicine may be given as an injection or through an IV tube.  In some cases, if your condition gets worse, you may need to deliver your baby early.  Follow these instructions at home: Eating and drinking  Drink enough fluid to keep your  urine clear or pale yellow.  Eat a healthy diet that is low in salt (sodium). Do not add salt to your food. Check food labels to see how much sodium a food or beverage contains. Lifestyle  Do not use any products that contain nicotine or tobacco, such as cigarettes and e-cigarettes. If you need help quitting, ask your health care provider.  Do not use alcohol.  Avoid caffeine.  Avoid stress as much as possible. Rest and get plenty of sleep. General instructions  Take over-the-counter and prescription medicines only as told by your health care provider.  While lying down, lie on your left side. This keeps pressure off your baby.  While sitting or lying down, raise (elevate) your feet. Try putting some pillows under your lower legs.  Exercise regularly. Ask your health care provider what kinds of exercise are best for you.  Keep all prenatal and follow-up visits as told by your health care provider. This is important. Contact a health care provider if:  You have symptoms that your health care provider told you may require more treatment or monitoring, such as: ? Fever. ? Vomiting. ? Headache. Get help right away if:  You have severe abdominal pain or vomiting that does not get better with treatment.  You   suddenly develop swelling in your hands, ankles, or face.  You gain 4 lbs (1.8 kg) or more in 1 week.  You develop vaginal bleeding, or you have blood in your urine.  You do not feel your baby moving as much as usual.  You have blurred or double vision.  You have muscle twitching or sudden tightening (spasms).  You have shortness of breath.  Your lips or fingernails turn blue. This information is not intended to replace advice given to you by your health care provider. Make sure you discuss any questions you have with your health care provider. Document Released: 02/13/2011 Document Revised: 12/16/2015 Document Reviewed: 11/11/2015 Elsevier Interactive Patient  Education  2018 [employer]  

## 2016-11-09 NOTE — Progress Notes (Signed)
High Risk Pregnancy Diagnosis(es): GHTN G2P0010 5522w1d Estimated Date of Delivery: 12/06/16 BP 138/78   Pulse 96   Wt 209 lb (94.8 kg)   LMP 02/06/2016 (Approximate)   BMI 31.78 kg/m  1st BP 146/70 Urinalysis: Positive for sm leuks HPI:  Doing well. Was sent to [facility] 5/30 after last visit for pre-e eval. All pre-e labs normal, P:C 0.07. UDS was neg for cocaine & THC. Denies ha, ruq/epigastric pain, n/v.  Still seeing floating black spots, went to eye MD  yesterday, they said all normal. BP, weight, and urine reviewed.  Reports good fm. Denies regular uc's, lof, vb, uti s/s.   Fundal Height:  35 Fetal Heart rate:  146 u/s Edema:  none  Discussed today's u/s: efw 52%/AFI13cm, BPP 8/8, dopp 75% w/ good EDF Reviewed ptl s/s, fkc All questions were answered Assessment: 7022w1d GHTN Medication(s) Plans:  None for now Treatment Plan:  2x/wk testing nst alt w/ bpp/dopp, IOL @ 37-39wks as clinically indicated Follow up on Tues for high-risk OB appt and NST

## 2016-11-09 NOTE — Progress Notes (Signed)
US 36+1 wks,cephalic,BPP 8/8,ant pl gr 1,afi 13 cm,fhr 146 bpm,RI .60,.65 75%,EFW 2906 g 52%

## 2016-11-13 ENCOUNTER — Ambulatory Visit (INDEPENDENT_AMBULATORY_CARE_PROVIDER_SITE_OTHER): Payer: Medicaid Other | Admitting: Obstetrics & Gynecology

## 2016-11-13 ENCOUNTER — Encounter: Payer: Self-pay | Admitting: Obstetrics & Gynecology

## 2016-11-13 VITALS — BP 124/66 | HR 104 | Wt 214.0 lb

## 2016-11-13 DIAGNOSIS — O133 Gestational [pregnancy-induced] hypertension without significant proteinuria, third trimester: Secondary | ICD-10-CM

## 2016-11-13 DIAGNOSIS — O0993 Supervision of high risk pregnancy, unspecified, third trimester: Secondary | ICD-10-CM | POA: Diagnosis not present

## 2016-11-13 DIAGNOSIS — Z3483 Encounter for supervision of other normal pregnancy, third trimester: Secondary | ICD-10-CM | POA: Diagnosis not present

## 2016-11-13 DIAGNOSIS — Z1389 Encounter for screening for other disorder: Secondary | ICD-10-CM

## 2016-11-13 DIAGNOSIS — Z3A36 36 weeks gestation of pregnancy: Secondary | ICD-10-CM

## 2016-11-13 DIAGNOSIS — Z331 Pregnant state, incidental: Secondary | ICD-10-CM | POA: Diagnosis not present

## 2016-11-13 LAB — POCT URINALYSIS DIPSTICK
Blood, UA: NEGATIVE
Glucose, UA: NEGATIVE
Ketones, UA: NEGATIVE
NITRITE UA: NEGATIVE

## 2016-11-13 NOTE — Progress Notes (Signed)
Fetal Surveillance Testing today:  Reactive NST   High Risk Pregnancy Diagnosis(es):   Borderline GHTN  G2P0010 2430w5d Estimated Date of Delivery: 12/06/16  Blood pressure 124/66, pulse (!) 104, weight 214 lb (97.1 kg), last menstrual period 02/06/2016.  Urinalysis: Positive for trace protein   HPI: The patient is being seen today for ongoing management of borderline HTN. Today she reports occasional headache and some floaters but has been having for some time BP at home 140-150/75-85 nothing higher BP today is great   BP weight and urine results all reviewed and noted. Patient reports good fetal movement, denies any bleeding and no rupture of membranes symptoms or regular contractions.  Fundal Height:  39 Fetal Heart rate:  140 Edema:  none  Patient is without complaints other than noted in her HPI. All questions were answered.  All lab and sonogram results have been reviewed. Comments:    Assessment:  1.  Pregnancy at 6630w5d,  Estimated Date of Delivery: 12/06/16 :                          2.  Borderline GHTN                        3.    Medication(s) Plans:  none  Treatment Plan:  Twice weekly surveillance, with her BP at these levels I do not feel indicated for delivery and can do twice weekly NST, if BP rises will revise that plan  No Follow-up on file. for appointment for high risk OB care  No orders of the defined types were placed in this encounter.  Orders Placed This Encounter  Procedures  . Strep Gp B NAA  . GC/Chlamydia Probe Amp  . POCT urinalysis dipstick

## 2016-11-14 ENCOUNTER — Encounter: Payer: Medicaid Other | Admitting: Advanced Practice Midwife

## 2016-11-15 LAB — GC/CHLAMYDIA PROBE AMP
Chlamydia trachomatis, NAA: NEGATIVE
NEISSERIA GONORRHOEAE BY PCR: NEGATIVE

## 2016-11-15 LAB — STREP GP B NAA: STREP GROUP B AG: POSITIVE — AB

## 2016-11-16 ENCOUNTER — Encounter: Payer: Self-pay | Admitting: Obstetrics and Gynecology

## 2016-11-16 ENCOUNTER — Ambulatory Visit (INDEPENDENT_AMBULATORY_CARE_PROVIDER_SITE_OTHER): Payer: Medicaid Other | Admitting: Obstetrics and Gynecology

## 2016-11-16 VITALS — BP 140/68 | HR 88 | Wt 212.0 lb

## 2016-11-16 DIAGNOSIS — O133 Gestational [pregnancy-induced] hypertension without significant proteinuria, third trimester: Secondary | ICD-10-CM

## 2016-11-16 DIAGNOSIS — Z3A37 37 weeks gestation of pregnancy: Secondary | ICD-10-CM | POA: Diagnosis not present

## 2016-11-16 DIAGNOSIS — Z331 Pregnant state, incidental: Secondary | ICD-10-CM

## 2016-11-16 DIAGNOSIS — Z1389 Encounter for screening for other disorder: Secondary | ICD-10-CM

## 2016-11-16 LAB — POCT URINALYSIS DIPSTICK
Blood, UA: NEGATIVE
GLUCOSE UA: NEGATIVE
Ketones, UA: NEGATIVE
NITRITE UA: NEGATIVE
Protein, UA: NEGATIVE

## 2016-11-16 NOTE — Progress Notes (Addendum)
Crystal Hines is a 21 y.o. female   High Risk Pregnancy HROB Diagnosis(es):  Borderline gestational HTN BP 140/68   Pulse 88   Wt 212 lb (96.2 kg)   LMP 02/06/2016 (Approximate)   BMI 32.23 kg/m   BP recheck 134/80  G2P0010 5763w1d Estimated Date of Delivery: 12/06/16    HPI: The patient is being seen today for ongoing management of the above. Today has no acute complaints or symptoms. Patient reports good fetal movement, denies any bleeding and no rupture of membranes symptoms or regular contractions. Denies headache.vsion changes or RUQ pain.  BP weight and urine results reviewed and noted. Last menstrual period 02/06/2016.  Fundal Height:  36 cm Fetal Heart rate:  145 bpm from reactive NST Physical Examination: Abdomen - soft, nontender, nondistended, no masses or organomegaly                                     Pelvic - not indicated at this time  Urinalysis: POSITIVE for small leukocytes  Fetal Surveillance Testing today:  Reactive NST  Lab and sonogram results have been reviewed.  Assessment:  1.  Pregnancy at 4363w1d,  G2P0010                           2 borderlline GHTN, now normalized pressures  Medication(s) Plans: None  Treatment Plan:  Will delay induction to 39 weeks or recurrence of elevated pressures or PIH symoptoms   Follow up in 4 days for appointment for high risk OB care and  NST  By signing my name below, I, Freida Busmaniana Omoyeni, attest that this documentation has been prepared under the direction and in the presence of Tilda BurrowJohn V Michelyn Scullin, MD . Electronically Signed: Freida Busmaniana Omoyeni, Scribe. 11/16/2016. 12:08 PM. I personally performed the services described in this documentation, which was SCRIBED in my presence. The recorded information has been reviewed and considered accurate. It has been edited as necessary during review. Tilda BurrowFERGUSON,Leta Bucklin V, MD

## 2016-11-20 ENCOUNTER — Encounter: Payer: Self-pay | Admitting: Women's Health

## 2016-11-20 ENCOUNTER — Ambulatory Visit (INDEPENDENT_AMBULATORY_CARE_PROVIDER_SITE_OTHER): Payer: Medicaid Other | Admitting: Women's Health

## 2016-11-20 VITALS — BP 122/80 | HR 80 | Wt 215.4 lb

## 2016-11-20 DIAGNOSIS — O0993 Supervision of high risk pregnancy, unspecified, third trimester: Secondary | ICD-10-CM | POA: Diagnosis not present

## 2016-11-20 DIAGNOSIS — Z1389 Encounter for screening for other disorder: Secondary | ICD-10-CM

## 2016-11-20 DIAGNOSIS — O133 Gestational [pregnancy-induced] hypertension without significant proteinuria, third trimester: Secondary | ICD-10-CM | POA: Diagnosis not present

## 2016-11-20 DIAGNOSIS — Z331 Pregnant state, incidental: Secondary | ICD-10-CM | POA: Diagnosis not present

## 2016-11-20 LAB — POCT URINALYSIS DIPSTICK
Glucose, UA: NEGATIVE
Ketones, UA: NEGATIVE
NITRITE UA: NEGATIVE
RBC UA: NEGATIVE

## 2016-11-20 NOTE — Patient Instructions (Signed)
Call the office ([phone]) or go to [facility] if:  You begin to have strong, frequent contractions  Your water breaks.  Sometimes it is a big gush of fluid, sometimes it is just a trickle that keeps getting your panties wet or running down your legs  You have vaginal bleeding.  It is normal to have a small amount of spotting if your cervix was checked.   You don't feel your baby moving like normal.  If you don't, get you something to eat and drink and lay down and focus on feeling your baby move.  You should feel at least 10 movements in 2 hours.  If you don't, you should call the office or go to [facility].    Call the office ([phone]) or go to [facility] for these signs of pre-eclampsia:  Severe headache that does not go away with Tylenol  Visual changes- seeing spots, double, blurred vision  Pain under your right breast or upper abdomen that does not go away with Tums or heartburn medicine  Nausea and/or vomiting  Severe swelling in your hands, feet, and face      Braxton Hicks Contractions Contractions of the uterus can occur throughout pregnancy, but they are not always a sign that you are in labor. You may have practice contractions called Braxton Hicks contractions. These false labor contractions are sometimes confused with true labor. What are Braxton Hicks contractions? Braxton Hicks contractions are tightening movements that occur in the muscles of the uterus before labor. Unlike true labor contractions, these contractions do not result in opening (dilation) and thinning of the cervix. Toward the end of pregnancy (32-34 weeks), Braxton Hicks contractions can happen more often and may become stronger. These contractions are sometimes difficult to tell apart from true labor because they can be very uncomfortable. You should not feel embarrassed if you go to the hospital with false labor. Sometimes, the only way to tell if you are in true labor is for your  health care provider to look for changes in the cervix. The health care provider will do a physical exam and may monitor your contractions. If you are not in true labor, the exam should show that your cervix is not dilating and your water has not broken. If there are no prenatal problems or other health problems associated with your pregnancy, it is completely safe for you to be sent home with false labor. You may continue to have Braxton Hicks contractions until you go into true labor. How can I tell the difference between true labor and false labor?  Differences  False labor  Contractions last 30-70 seconds.: Contractions are usually shorter and not as strong as true labor contractions.  Contractions become very regular.: Contractions are usually irregular.  Discomfort is usually felt in the top of the uterus, and it spreads to the lower abdomen and low back.: Contractions are often felt in the front of the lower abdomen and in the groin.  Contractions do not go away with walking.: Contractions may go away when you walk around or change positions while lying down.  Contractions usually become more intense and increase in frequency.: Contractions get weaker and are shorter-lasting as time goes on.  The cervix dilates and gets thinner.: The cervix usually does not dilate or become thin. Follow these instructions at home:  Take over-the-counter and prescription medicines only as told by your health care provider.  Keep up with your usual exercises and follow other instructions from your health   care provider.  Eat and drink lightly if you think you are going into labor.  If Braxton Hicks contractions are making you uncomfortable:  Change your position from lying down or resting to walking, or change from walking to resting.  Sit and rest in a tub of warm water.  Drink enough fluid to keep your urine clear or pale yellow. Dehydration may cause these contractions.  Do slow and deep  breathing several times an hour.  Keep all follow-up prenatal visits as told by your health care provider. This is important. Contact a health care provider if:  You have a fever.  You have continuous pain in your abdomen. Get help right away if:  Your contractions become stronger, more regular, and closer together.  You have fluid leaking or gushing from your vagina.  You pass blood-tinged mucus (bloody show).  You have bleeding from your vagina.  You have low back pain that you never had before.  You feel your baby's head pushing down and causing pelvic pressure.  Your baby is not moving inside you as much as it used to. Summary  Contractions that occur before labor are called Braxton Hicks contractions, false labor, or practice contractions.  Braxton Hicks contractions are usually shorter, weaker, farther apart, and less regular than true labor contractions. True labor contractions usually become progressively stronger and regular and they become more frequent.  Manage discomfort from Braxton Hicks contractions by changing position, resting in a warm bath, drinking plenty of water, or practicing deep breathing. This information is not intended to replace advice given to you by your health care provider. Make sure you discuss any questions you have with your health care provider. Document Released: 05/28/2005 Document Revised: 04/16/2016 Document Reviewed: 04/16/2016 Elsevier Interactive Patient Education  2017 [employer] Inc[employer]  

## 2016-11-20 NOTE — Progress Notes (Signed)
High Risk Pregnancy Diagnosis(es): GHTN G2P0010 4726w5d Estimated Date of Delivery: 12/06/16 BP 122/80   Pulse 80   Wt 215 lb 6.4 oz (97.7 kg)   LMP 02/06/2016 (Approximate)   BMI 32.75 kg/m   Urinalysis: Positive for tr protein, 2+ leuks HPI:  Doing well. Denies ha, visual changes, ruq/epigastric pain, n/v.  Seeing black spots has stopped.  BP, weight, and urine reviewed.  Reports good fm. Denies regular uc's, lof, vb, uti s/s.  No complaints. Feels like baby has dropped  Fundal Height:  35 Fetal Heart rate:  135, reactive NST Edema:  None DTRs 2+, no clonus  Reviewed labor s/s, pre-e s/s, fkc, GBS+ All questions were answered Assessment: 2326w5d GHTN- bp normal today Medication(s) Plans:  none Treatment Plan:  2x/wk nst's per [facility] as long as bp remains normal w/ IOL @ 39wks Follow up in 3d for high-risk OB appt and NST

## 2016-11-23 ENCOUNTER — Ambulatory Visit (INDEPENDENT_AMBULATORY_CARE_PROVIDER_SITE_OTHER): Payer: Medicaid Other | Admitting: Obstetrics and Gynecology

## 2016-11-23 ENCOUNTER — Encounter: Payer: Self-pay | Admitting: Obstetrics and Gynecology

## 2016-11-23 VITALS — BP 138/80 | HR 93 | Wt 217.0 lb

## 2016-11-23 DIAGNOSIS — Z331 Pregnant state, incidental: Secondary | ICD-10-CM | POA: Diagnosis not present

## 2016-11-23 DIAGNOSIS — Z1389 Encounter for screening for other disorder: Secondary | ICD-10-CM

## 2016-11-23 DIAGNOSIS — O133 Gestational [pregnancy-induced] hypertension without significant proteinuria, third trimester: Secondary | ICD-10-CM

## 2016-11-23 DIAGNOSIS — O0993 Supervision of high risk pregnancy, unspecified, third trimester: Secondary | ICD-10-CM | POA: Diagnosis not present

## 2016-11-23 LAB — POCT URINALYSIS DIPSTICK
Blood, UA: NEGATIVE
Glucose, UA: NEGATIVE
Ketones, UA: NEGATIVE
NITRITE UA: NEGATIVE
PROTEIN UA: NEGATIVE

## 2016-11-23 NOTE — Progress Notes (Signed)
Crystal Hines is a 21 y.o. female   High Risk Pregnancy HROB Diagnosis(es):   GHTN  G2P0010 6458w1d Estimated Date of Delivery: 12/06/16    HPI: The patient is being seen today for ongoing management of the above. Blood pressures have normalized. Pt has no acute complaints or associated symptoms at this time. Patient reports good fetal movement, denies any bleeding and no rupture of membranes symptoms or regular contractions.   BP weight and urine results reviewed and noted. Blood pressure 138/80, pulse 93, weight 217 lb (98.4 kg), last menstrual period 02/06/2016.  Fundal Height:  36 cm Fetal Heart rate: 130 bpm with axels to 140, reactive NST  Physical Examination: Abdomen - soft, nontender, nondistended, no masses or organomegaly                                     Pelvic - examination not indicated                                     Edema:  none  Urinalysis:                POSITIVE for moderate leukocytes  Fetal Surveillance Testing today:  NST  Lab and sonogram results have been reviewed.  Assessment:  1.  Pregnancy at 6758w1d,  G2P0010                         2.  Controlled GHTN                       Medication(s) Plans:  None  Treatment Plan: Follow up in NST in 4 days for appointment for high risk OB care. Pt scheduled to deliver on 11/30/2016.  By signing my name below, I, Freida BusmanDiana Omoyeni, attest that this documentation has been prepared under the direction and in the presence of Tilda BurrowJohn V Rollande Thursby, MD . Electronically Signed: Freida Busmaniana Omoyeni, Scribe. 11/23/2016. 12:18 PM. I personally performed the services described in this documentation, which was SCRIBED in my presence. The recorded information has been reviewed and considered accurate. It has been edited as necessary during review. Tilda BurrowFERGUSON,Heavenly Christine V, MD

## 2016-11-26 ENCOUNTER — Telehealth (HOSPITAL_COMMUNITY): Payer: Self-pay | Admitting: *Deleted

## 2016-11-26 NOTE — Telephone Encounter (Signed)
Preadmission screen  

## 2016-11-27 ENCOUNTER — Ambulatory Visit (INDEPENDENT_AMBULATORY_CARE_PROVIDER_SITE_OTHER): Payer: Medicaid Other | Admitting: Advanced Practice Midwife

## 2016-11-27 ENCOUNTER — Encounter: Payer: Self-pay | Admitting: Advanced Practice Midwife

## 2016-11-27 VITALS — BP 140/72 | HR 86 | Wt 217.5 lb

## 2016-11-27 DIAGNOSIS — Z3A38 38 weeks gestation of pregnancy: Secondary | ICD-10-CM | POA: Diagnosis not present

## 2016-11-27 DIAGNOSIS — O0990 Supervision of high risk pregnancy, unspecified, unspecified trimester: Secondary | ICD-10-CM

## 2016-11-27 DIAGNOSIS — Z331 Pregnant state, incidental: Secondary | ICD-10-CM

## 2016-11-27 DIAGNOSIS — O133 Gestational [pregnancy-induced] hypertension without significant proteinuria, third trimester: Secondary | ICD-10-CM

## 2016-11-27 DIAGNOSIS — Z1389 Encounter for screening for other disorder: Secondary | ICD-10-CM

## 2016-11-27 LAB — POCT URINALYSIS DIPSTICK
GLUCOSE UA: NEGATIVE
Ketones, UA: NEGATIVE
NITRITE UA: NEGATIVE

## 2016-11-27 NOTE — Progress Notes (Signed)
Fetal Surveillance Testing today:  NST   High Risk Pregnancy Diagnosis(es):   CHTN vs GHTN, no meds  G2P0010 2878w5d Estimated Date of Delivery: 12/06/16  Last menstrual period 02/06/2016.  Urinalysis: Negative   HPI: The patient is being seen today for ongoing management of the above . Today she reports no complaints   BP weight and urine results all reviewed and noted. Patient reports good fetal movement, denies any bleeding and no rupture of membranes symptoms or regular contractions.  Fetal Heart rate:  130 Edema:  no  Patient is without complaints other than noted in her HPI. All questions were answered.  All lab and sonogram results have been reviewed. Comments: NST reactive  Assessment:  1.  Pregnancy at 8078w5d,  Estimated Date of Delivery: 12/06/16 :                          2.  Chronic vs GHTN, BP normal                        3.    Medication(s) Plans:  none  Treatment Plan:  IOL scheduled for Thursday orders/H&P done  Return in about 5 weeks (around 01/01/2017) for postpartum. for appointment for high risk OB care  No orders of the defined types were placed in this encounter.  No orders of the defined types were placed in this encounter.

## 2016-11-27 NOTE — H&P (Signed)
Crystal Hines is a 21 y.o. 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  She had elevated BP at 11 weeks and then again third trimester. She has been in twice weekly testing, BPs normalized prior to 37 weeks.[facility] 11 wks  Prenatal History/Complications: + UDS for cocaine/THC X1 ("was around someone smoking THC laced with cocaine" Elevated BPs, mild    Past Medical History: Past Medical History:  Diagnosis Date  . Medical history non-contributory   . Pregnant 08/23/2015    Past Surgical History: Past Surgical History:  Procedure Laterality Date  . NO PAST SURGERIES      Obstetrical History: OB History    Gravida Para Term Preterm AB Living   2       1     SAB TAB Ectopic Multiple Live Births   1              Social History: Social History   Social History  . Marital status: Single    Spouse name: N/A  . Number of children: N/A  . Years of education: N/A   Social History Main Topics  . Smoking status: Former Smoker    Years: 2.00    Types: Cigarettes  . Smokeless tobacco: Never Used  . Alcohol use No  . Drug use: No  . Sexual activity: Yes    Birth control/ protection: None   Other Topics Concern  . Not on file   Social History Narrative  . No narrative on file    Family History: Family History  Problem Relation Age of Onset  . Hypertension Mother   . Hypertension Father   . Other Father        blood clots  . Hypertension Maternal Grandmother   . Hypertension Maternal Grandfather   . Congestive Heart Failure Paternal Grandfather     Allergies: No Known Allergies   [facility]  Initiated Care at  11wks  [name] Shirlean Kelly  Dating By 9wk u/s  Pap 05/18/16 ASCUS w/ +HRHPV    Colpo >14wks:  GC/CT Initial:  -/-              36+wks:-/-  Genetic Screen NT/IT: neg  CF screen Declined 05/18/16   Anatomic [place] Female w/ 2 Lt EICF, otherwise normal Repeat @ 27wks: stable   Resolved @  36wks  Flu vaccine declined  Tdap Recommended ~ 28wks  Glucose Screen  2 hr normal: 73/81/57  GBS positive  Feed Preference bottle  Contraception Nuva Ring  Circumcision n/a  Childbirth Classes declines  Pediatrician Undecided, info given     Review of Systems   Constitutional: Negative for fever and chills Eyes: Negative for visual disturbances Respiratory: Negative for shortness of breath, dyspnea Cardiovascular: Negative for chest pain or palpitations  Gastrointestinal: Negative for abdominal pain, vomiting, diarrhea and constipation.   Genitourinary: Negative for dysuria and urgency Musculoskeletal: Negative for back pain, joint pain, myalgias  Neurological: Negative for dizziness and headaches      Last menstrual period 02/06/2016. General appearance: alert, cooperative and no distress Lungs: clear to auscultation bilaterally Heart: regular rate and rhythm Abdomen: soft, non-tender; bowel sounds normal Extremities: Homans sign is negative, no sign of DVT DTR's 2+ Cervix:   Presentation: cephalic Fetal monitoring  Baseline: 130 bpm, Variability: Good {> 6 bpm), Accelerations: Reactive and Decelerations: Absent Uterine activity  None      Prenatal labs: ABO, Rh: B/Positive/-- (05/09 1433) Antibody:  Negative (05/09 1433) Rubella: immune RPR: Non Reactive (05/09 1433)  HBsAg: Negative (05/09 1433)  HIV: Non Reactive (05/09 1433)  GBS: Positive (06/05 1500)    Prenatal Transfer Tool  Maternal Diabetes: No Genetic Screening: Normal Maternal Ultrasounds/Referrals: Normal Fetal Ultrasounds or other Referrals:  None Maternal Substance Abuse:  Yes:  Type: Cocaine(pt denies) Significant Maternal Medications:  None Significant Maternal Lab Results: Lab values include: Group B Strep positive    Results for orders placed or performed in visit on 11/27/16 (from the past 24 hour(s))  POCT Urinalysis Dipstick   Collection Time: 11/27/16 10:39 AM  Result Value Ref  Range   Color, UA     Clarity, UA     Glucose, UA neg    Bilirubin, UA     Ketones, UA neg    Spec Grav, UA  1.010 - 1.025   Blood, UA trace    pH, UA  5.0 - 8.0   Protein, UA trace    Urobilinogen, UA  0.2 or 1.0 E.U./dL   Nitrite, UA neg    Leukocytes, UA Moderate (2+) (A) Negative    Assessment: Crystal Hines is a 21 y.o. G2P0010 with an IUP at 944w5d presenting for IOL for chronic vs gestational HTN.  Plan: #Labor: Cytotec->Foley->pitocin #Pain:  Per request #FWB Cat 1   CRESENZO-DISHMAN,Torryn Fiske 11/27/2016, 10:57 AM

## 2016-11-28 ENCOUNTER — Encounter (HOSPITAL_COMMUNITY): Payer: Self-pay | Admitting: *Deleted

## 2016-11-28 ENCOUNTER — Telehealth (HOSPITAL_COMMUNITY): Payer: Self-pay | Admitting: *Deleted

## 2016-11-28 DIAGNOSIS — O134 Gestational [pregnancy-induced] hypertension without significant proteinuria, complicating childbirth: Secondary | ICD-10-CM | POA: Diagnosis not present

## 2016-11-28 DIAGNOSIS — Z3A39 39 weeks gestation of pregnancy: Secondary | ICD-10-CM | POA: Diagnosis not present

## 2016-11-28 DIAGNOSIS — Z3A38 38 weeks gestation of pregnancy: Secondary | ICD-10-CM | POA: Diagnosis not present

## 2016-11-28 DIAGNOSIS — O133 Gestational [pregnancy-induced] hypertension without significant proteinuria, third trimester: Secondary | ICD-10-CM | POA: Diagnosis not present

## 2016-11-28 NOTE — Telephone Encounter (Signed)
Preadmission screen  

## 2016-11-30 ENCOUNTER — Encounter (HOSPITAL_COMMUNITY): Payer: Self-pay

## 2016-11-30 ENCOUNTER — Inpatient Hospital Stay (HOSPITAL_COMMUNITY)
Admission: RE | Admit: 2016-11-30 | Discharge: 2016-12-03 | DRG: 774 | Disposition: A | Discharge status: Home / Self Care | Payer: Medicaid Other | Source: Ambulatory Visit | Attending: Obstetrics and Gynecology | Admitting: Obstetrics and Gynecology

## 2016-11-30 ENCOUNTER — Inpatient Hospital Stay (HOSPITAL_COMMUNITY): Payer: Medicaid Other | Admitting: Anesthesiology

## 2016-11-30 VITALS — BP 139/76 | HR 78 | Temp 98.5°F | Resp 18 | Ht 68.0 in | Wt 217.0 lb

## 2016-11-30 DIAGNOSIS — O133 Gestational [pregnancy-induced] hypertension without significant proteinuria, third trimester: Secondary | ICD-10-CM

## 2016-11-30 DIAGNOSIS — O1002 Pre-existing essential hypertension complicating childbirth: Principal | ICD-10-CM | POA: Diagnosis present

## 2016-11-30 DIAGNOSIS — Z87891 Personal history of nicotine dependence: Secondary | ICD-10-CM

## 2016-11-30 DIAGNOSIS — Z3A38 38 weeks gestation of pregnancy: Secondary | ICD-10-CM | POA: Diagnosis not present

## 2016-11-30 DIAGNOSIS — Z3A39 39 weeks gestation of pregnancy: Secondary | ICD-10-CM | POA: Diagnosis not present

## 2016-11-30 DIAGNOSIS — O0990 Supervision of high risk pregnancy, unspecified, unspecified trimester: Secondary | ICD-10-CM

## 2016-11-30 DIAGNOSIS — O99824 Streptococcus B carrier state complicating childbirth: Secondary | ICD-10-CM | POA: Diagnosis present

## 2016-11-30 DIAGNOSIS — O134 Gestational [pregnancy-induced] hypertension without significant proteinuria, complicating childbirth: Secondary | ICD-10-CM | POA: Diagnosis not present

## 2016-11-30 DIAGNOSIS — I10 Essential (primary) hypertension: Secondary | ICD-10-CM

## 2016-11-30 LAB — CBC
HCT: 32.9 % — ABNORMAL LOW (ref 36.0–46.0)
HCT: 37.1 % (ref 36.0–46.0)
HEMOGLOBIN: 10.8 g/dL — AB (ref 12.0–15.0)
Hemoglobin: 12.4 g/dL (ref 12.0–15.0)
MCH: 26.1 pg (ref 26.0–34.0)
MCH: 26.7 pg (ref 26.0–34.0)
MCHC: 32.8 g/dL (ref 30.0–36.0)
MCHC: 33.4 g/dL (ref 30.0–36.0)
MCV: 79.5 fL (ref 78.0–100.0)
MCV: 79.8 fL (ref 78.0–100.0)
PLATELETS: 208 10*3/uL (ref 150–400)
Platelets: 218 10*3/uL (ref 150–400)
RBC: 4.14 MIL/uL (ref 3.87–5.11)
RBC: 4.65 MIL/uL (ref 3.87–5.11)
RDW: 14.6 % (ref 11.5–15.5)
RDW: 14.6 % (ref 11.5–15.5)
WBC: 6.3 10*3/uL (ref 4.0–10.5)
WBC: 7.3 10*3/uL (ref 4.0–10.5)

## 2016-11-30 LAB — RAPID URINE DRUG SCREEN, HOSP PERFORMED
Amphetamines: NOT DETECTED
Barbiturates: NOT DETECTED
Benzodiazepines: NOT DETECTED
Cocaine: NOT DETECTED
Opiates: NOT DETECTED
Tetrahydrocannabinol: NOT DETECTED

## 2016-11-30 LAB — TYPE AND SCREEN
ABO/RH(D): B POS
ANTIBODY SCREEN: NEGATIVE

## 2016-11-30 LAB — ABO/RH: ABO/RH(D): B POS

## 2016-11-30 LAB — RPR: RPR: NONREACTIVE

## 2016-11-30 MED ORDER — TERBUTALINE SULFATE 1 MG/ML IJ SOLN
0.2500 mg | Freq: Once | INTRAMUSCULAR | Status: DC | PRN
  Filled 2016-11-30: qty 1

## 2016-11-30 MED ORDER — FLEET ENEMA 7-19 GM/118ML RE ENEM: 1.0000 | ENEMA | RECTAL | Status: DC | PRN

## 2016-11-30 MED ORDER — ZOLPIDEM TARTRATE 5 MG PO TABS: 5.0000 mg | ORAL_TABLET | Freq: Every evening | ORAL | Status: DC | PRN

## 2016-11-30 MED ORDER — MISOPROSTOL 25 MCG QUARTER TABLET
25.0000 ug | ORAL_TABLET | ORAL | Status: DC | PRN
  Administered 2016-11-30 (×2): 25 ug via VAGINAL
  Filled 2016-11-30 (×3): qty 1

## 2016-11-30 MED ORDER — OXYTOCIN BOLUS FROM INFUSION
500.0000 mL | Freq: Once | INTRAVENOUS | Status: AC
  Administered 2016-12-01: 500 mL via INTRAVENOUS

## 2016-11-30 MED ORDER — FENTANYL CITRATE (PF) 100 MCG/2ML IJ SOLN: 50.0000 ug | INTRAMUSCULAR | Status: DC | PRN

## 2016-11-30 MED ORDER — ONDANSETRON HCL 4 MG/2ML IJ SOLN
4.0000 mg | Freq: Four times a day (QID) | INTRAMUSCULAR | Status: DC | PRN
Start: 2016-11-30 — End: 2016-12-01
  Administered 2016-11-30 – 2016-12-01 (×2): 4 mg via INTRAVENOUS
  Filled 2016-11-30 (×2): qty 2

## 2016-11-30 MED ORDER — DIPHENHYDRAMINE HCL 50 MG/ML IJ SOLN: 12.5000 mg | INTRAMUSCULAR | Status: DC | PRN

## 2016-11-30 MED ORDER — FENTANYL 2.5 MCG/ML BUPIVACAINE 1/10 % EPIDURAL INFUSION (WH - ANES)
14.0000 mL/h | INTRAMUSCULAR | Status: DC | PRN
Start: 2016-11-30 — End: 2016-12-01
  Administered 2016-11-30 – 2016-12-01 (×3): 14 mL/h via EPIDURAL
  Filled 2016-11-30 (×3): qty 100

## 2016-11-30 MED ORDER — SOD CITRATE-CITRIC ACID 500-334 MG/5ML PO SOLN: 30.0000 mL | ORAL | Status: DC | PRN

## 2016-11-30 MED ORDER — EPHEDRINE 5 MG/ML INJ
10.0000 mg | INTRAVENOUS | Status: DC | PRN
  Filled 2016-11-30: qty 2

## 2016-11-30 MED ORDER — OXYCODONE-ACETAMINOPHEN 5-325 MG PO TABS: 2.0000 | ORAL_TABLET | ORAL | Status: DC | PRN

## 2016-11-30 MED ORDER — PHENYLEPHRINE 40 MCG/ML (10ML) SYRINGE FOR IV PUSH (FOR BLOOD PRESSURE SUPPORT)
80.0000 ug | PREFILLED_SYRINGE | INTRAVENOUS | Status: DC | PRN
  Filled 2016-11-30: qty 5

## 2016-11-30 MED ORDER — OXYCODONE-ACETAMINOPHEN 5-325 MG PO TABS: 1.0000 | ORAL_TABLET | ORAL | Status: DC | PRN

## 2016-11-30 MED ORDER — PHENYLEPHRINE 40 MCG/ML (10ML) SYRINGE FOR IV PUSH (FOR BLOOD PRESSURE SUPPORT)
80.0000 ug | PREFILLED_SYRINGE | INTRAVENOUS | Status: DC | PRN
  Filled 2016-11-30: qty 5
  Filled 2016-11-30: qty 10

## 2016-11-30 MED ORDER — MISOPROSTOL 200 MCG PO TABS
50.0000 ug | ORAL_TABLET | ORAL | Status: DC
  Administered 2016-11-30: 50 ug via ORAL
  Filled 2016-11-30: qty 1

## 2016-11-30 MED ORDER — LIDOCAINE HCL (PF) 1 % IJ SOLN
INTRAMUSCULAR | Status: DC | PRN
  Administered 2016-11-30 (×2): 7 mL via EPIDURAL

## 2016-11-30 MED ORDER — LACTATED RINGERS IV SOLN
INTRAVENOUS | Status: DC
  Administered 2016-11-30: 01:00:00 via INTRAVENOUS

## 2016-11-30 MED ORDER — LACTATED RINGERS IV SOLN
500.0000 mL | Freq: Once | INTRAVENOUS | Status: AC
  Administered 2016-11-30: 500 mL via INTRAVENOUS

## 2016-11-30 MED ORDER — OXYTOCIN 40 UNITS IN LACTATED RINGERS INFUSION - SIMPLE MED
2.5000 [IU]/h | INTRAVENOUS | Status: DC
  Administered 2016-12-01: 2.5 [IU]/h via INTRAVENOUS
  Filled 2016-11-30: qty 1000

## 2016-11-30 MED ORDER — LACTATED RINGERS IV SOLN: 500.0000 mL | INTRAVENOUS | Status: DC | PRN

## 2016-11-30 MED ORDER — PENICILLIN G POT IN DEXTROSE 60000 UNIT/ML IV SOLN
3.0000 10*6.[IU] | INTRAVENOUS | Status: DC
  Administered 2016-11-30 – 2016-12-01 (×3): 3 10*6.[IU] via INTRAVENOUS
  Filled 2016-11-30 (×9): qty 50

## 2016-11-30 MED ORDER — ACETAMINOPHEN 325 MG PO TABS: 650.0000 mg | ORAL_TABLET | ORAL | Status: DC | PRN

## 2016-11-30 MED ORDER — LIDOCAINE HCL (PF) 1 % IJ SOLN
30.0000 mL | INTRAMUSCULAR | Status: AC | PRN
  Administered 2016-12-01: 30 mL via SUBCUTANEOUS
  Filled 2016-11-30: qty 30

## 2016-11-30 MED ORDER — PENICILLIN G POTASSIUM 5000000 UNITS IJ SOLR
5.0000 10*6.[IU] | Freq: Once | INTRAVENOUS | Status: AC
  Administered 2016-11-30: 5 10*6.[IU] via INTRAVENOUS
  Filled 2016-11-30: qty 5

## 2016-11-30 MED ORDER — OXYTOCIN 40 UNITS IN LACTATED RINGERS INFUSION - SIMPLE MED
1.0000 m[IU]/min | INTRAVENOUS | Status: DC
  Administered 2016-11-30: 2 m[IU]/min via INTRAVENOUS

## 2016-11-30 NOTE — Anesthesia Preprocedure Evaluation (Signed)
Anesthesia Evaluation  Patient identified by MRN, date of birth, ID band Patient awake    Reviewed: Allergy & Precautions, H&P , NPO status , Patient's Chart, lab work & pertinent test results  Airway Mallampati: II  TM Distance: >3 FB Neck ROM: full    Dental no notable dental hx. (+) Teeth Intact   Pulmonary former smoker,    Pulmonary exam normal breath sounds clear to auscultation       Cardiovascular hypertension, negative cardio ROS Normal cardiovascular exam Rhythm:regular Rate:Normal     Neuro/Psych negative neurological ROS  negative psych ROS   GI/Hepatic negative GI ROS, Neg liver ROS,   Endo/Other  negative endocrine ROS  Renal/GU negative Renal ROS     Musculoskeletal negative musculoskeletal ROS (+)   Abdominal (+) + obese,   Peds  Hematology negative hematology ROS (+)   Anesthesia Other Findings   Reproductive/Obstetrics (+) Pregnancy                             Anesthesia Physical Anesthesia Plan  ASA: II  Anesthesia Plan: Epidural   Post-op Pain Management:    Induction:   PONV Risk Score and Plan:   Airway Management Planned:   Additional Equipment:   Intra-op Plan:   Post-operative Plan:   Informed Consent: I have reviewed the patients History and Physical, chart, labs and discussed the procedure including the risks, benefits and alternatives for the proposed anesthesia with the patient or authorized representative who has indicated his/her understanding and acceptance.     Plan Discussed with:   Anesthesia Plan Comments:         Anesthesia Quick Evaluation

## 2016-11-30 NOTE — Progress Notes (Signed)
S: Patient seen & examined for progress of labor, admitted for IOL due to gHTN. Patient comfortable with epidural.    O:  Vitals:   11/30/16 1901 11/30/16 1930 11/30/16 2000 11/30/16 2030  BP: (!) 142/80 135/81 (!) 148/90 140/83  Pulse: 78 94 86 76  Resp:  18 18 18   Temp:   98.1 F (36.7 C)   TempSrc:   Oral   Weight:      Height:        Dilation: 6 Effacement (%): 50 Cervical Position: Posterior Station: -1 Presentation: Vertex Exam by:: Melburn PopperMichelle Williams, RN   FHT: 130 bpm, mod var, +accels, no decels TOCO: q2-63min   A/P: Labor: s/p cytotec/foley bulb, now with pit at 4916mu/m Pain: epidural FWB: cat 1   Continue expectant management Anticipate SVD  Howard PouchLauren Dashonna Chagnon, MD PGY-1 [facility]

## 2016-11-30 NOTE — Anesthesia Pain Management Evaluation Note (Signed)
  CRNA Pain Management Visit Note  Patient: Crystal Hines, 21 y.o., female  "Hello I am a member of the anesthesia team at [facility]. We have an anesthesia team available at all times to provide care throughout the hospital, including epidural management and anesthesia for C-section. I don't know your plan for the delivery whether it a natural birth, water birth, IV sedation, nitrous supplementation, doula or epidural, but we want to meet your pain goals."   1.Was your pain managed to your expectations on prior hospitalizations?   No prior hospitalizations  2.What is your expectation for pain management during this hospitalization?     Epidural  3.How can we help you reach that goal? Epidural when ready  Record the patient's initial score and the patient's pain goal.   Pain: 0  Pain Goal: 6 [facility] wants you to be able to say your pain was always managed very well.  Edison PaceWILKERSON,Shequilla Goodgame 11/30/2016

## 2016-11-30 NOTE — Progress Notes (Signed)
Patient ID: [name], female   DOB: 1996/02/26, 21 y.o.   MRN: [id number]  Mostly comfortable, some cramping, s/p cyto x 2 doses vag BP 140/83, other VSS EFM 130s, +accels, no decels Irreg ctx Cx soft/ext os 2/int os FT/50/PP high (will confirm by U/S)  IUP@term  gHTN Unfavorable cx  Cervical foley placed- will leave until it comes out, then plan Crystal Emoryit  Crystal Hines CNM 11/30/2016 10:03 AM

## 2016-11-30 NOTE — Progress Notes (Signed)
Patient ID: [name], female   DOB: 31-Mar-1996, 21 y.o.   MRN: [id number]  Comfortable w/ epidural  BP 147/89, other VSS FHR 130s, +accels, no decels Ctx q 2-3 mins w/ Pit @ 5618mu/min Cx 5+/50/-2  IUP@term  gHTN- no severe range BPs Protracted active labor  IUPC inserted without difficulty Will titrate Pit to achieve adequate MVUs  Cam HaiSHAW, Crystal Hines CNM 11/30/2016 10:18 PM

## 2016-11-30 NOTE — Anesthesia Procedure Notes (Signed)
Epidural Patient location during procedure: OB Start time: 11/30/2016 11:50 AM End time: 11/30/2016 11:53 AM  Staffing Anesthesiologist: Leilani AbleHATCHETT, Jahdai Padovano Performed: anesthesiologist   Preanesthetic Checklist Completed: patient identified, surgical consent, pre-op evaluation, timeout performed, IV checked, risks and benefits discussed and monitors and equipment checked  Epidural Patient position: sitting Prep: site prepped and draped and DuraPrep Patient monitoring: continuous pulse ox and blood pressure Approach: midline Location: L3-L4 Injection technique: LOR air  Needle:  Needle type: Tuohy  Needle gauge: 17 G Needle length: 9 cm and 9 Needle insertion depth: 5 cm cm Catheter type: closed end flexible Catheter size: 19 Gauge Catheter at skin depth: 10 cm Test dose: negative and Other  Assessment Sensory level: T9 Events: blood not aspirated, injection not painful, no injection resistance, negative IV test and no paresthesia  Additional Notes Reason for block:procedure for pain

## 2016-11-30 NOTE — Progress Notes (Signed)
Crystal Hines is a 21 y.o. G2P0010 at 9440w1d  admitted for induction of labor due to gestational hypertension.  Subjective: Patient comfortable with epidural  Objective: Vitals:   11/30/16 1215 11/30/16 1221 11/30/16 1331 11/30/16 1401  BP: (!) 157/90 (!) 153/87 123/81 137/79  Pulse: 89 (!) 120 68 70  Resp:      Temp:      TempSrc:      Weight:      Height:       FHT:  FHR: 120. bpm, variability: moderate,  accelerations:  Present,  decelerations:  Absent UC:   irregular, every 3-5 minutes SVE:   Dilation: 5.5 Effacement (%): 50 Station: -2 Exam by:: neil SNM  Labs: Lab Results  Component Value Date   WBC 7.3 11/30/2016   HGB 12.4 11/30/2016   HCT 37.1 11/30/2016   MCV 79.8 11/30/2016   PLT 208 11/30/2016    Assessment / Plan: IUP at term. IOL for gestational hypertension. GBS pos.  Discussed with patient risks and benefits of pitocin for labor augmentation. Patient agreeable to plan.   Plan: pitocin 2x2 for augmentation of labor. PCN for GBS prophylaxis. Recheck in 1-2 hours. Anticipate NSVD.  [name] SNM 11/30/2016, 2:12 PM   I confirm that I have verified the information documented in the student nurse midwife's note and that I have also personally reperformed the physical exam and all medical decision making activities.  Cam HaiSHAW, KIMBERLY CNM 11/30/2016 2:56 PM

## 2016-11-30 NOTE — Plan of Care (Signed)
Elevated BP not treated; awaiting epidural placement

## 2016-12-01 ENCOUNTER — Encounter (HOSPITAL_COMMUNITY): Payer: Self-pay

## 2016-12-01 DIAGNOSIS — O139 Gestational [pregnancy-induced] hypertension without significant proteinuria, unspecified trimester: Secondary | ICD-10-CM

## 2016-12-01 DIAGNOSIS — Z3A39 39 weeks gestation of pregnancy: Secondary | ICD-10-CM

## 2016-12-01 DIAGNOSIS — O134 Gestational [pregnancy-induced] hypertension without significant proteinuria, complicating childbirth: Secondary | ICD-10-CM

## 2016-12-01 LAB — CBC
HCT: 34.7 % — ABNORMAL LOW (ref 36.0–46.0)
Hemoglobin: 11.6 g/dL — ABNORMAL LOW (ref 12.0–15.0)
MCH: 26.5 pg (ref 26.0–34.0)
MCHC: 33.4 g/dL (ref 30.0–36.0)
MCV: 79.2 fL (ref 78.0–100.0)
PLATELETS: 191 10*3/uL (ref 150–400)
RBC: 4.38 MIL/uL (ref 3.87–5.11)
RDW: 14.4 % (ref 11.5–15.5)
WBC: 9 10*3/uL (ref 4.0–10.5)

## 2016-12-01 MED ORDER — PRENATAL MULTIVITAMIN CH
1.0000 | ORAL_TABLET | Freq: Every day | ORAL | Status: DC
  Administered 2016-12-01 – 2016-12-02 (×2): 1 via ORAL
  Filled 2016-12-01 (×2): qty 1

## 2016-12-01 MED ORDER — ONDANSETRON HCL 4 MG/2ML IJ SOLN: 4.0000 mg | INTRAMUSCULAR | Status: DC | PRN

## 2016-12-01 MED ORDER — ONDANSETRON HCL 4 MG PO TABS: 4.0000 mg | ORAL_TABLET | ORAL | Status: DC | PRN

## 2016-12-01 MED ORDER — SIMETHICONE 80 MG PO CHEW: 80.0000 mg | CHEWABLE_TABLET | ORAL | Status: DC | PRN

## 2016-12-01 MED ORDER — SENNOSIDES-DOCUSATE SODIUM 8.6-50 MG PO TABS
2.0000 | ORAL_TABLET | ORAL | Status: DC
  Administered 2016-12-02: 2 via ORAL
  Filled 2016-12-01 (×2): qty 2

## 2016-12-01 MED ORDER — IBUPROFEN 600 MG PO TABS
600.0000 mg | ORAL_TABLET | Freq: Four times a day (QID) | ORAL | Status: DC
  Administered 2016-12-01 – 2016-12-03 (×6): 600 mg via ORAL
  Filled 2016-12-01 (×7): qty 1

## 2016-12-01 MED ORDER — AMLODIPINE BESYLATE 5 MG PO TABS
5.0000 mg | ORAL_TABLET | Freq: Every day | ORAL | Status: DC
  Administered 2016-12-01 – 2016-12-03 (×3): 5 mg via ORAL
  Filled 2016-12-01 (×4): qty 1

## 2016-12-01 MED ORDER — ZOLPIDEM TARTRATE 5 MG PO TABS: 5.0000 mg | ORAL_TABLET | Freq: Every evening | ORAL | Status: DC | PRN

## 2016-12-01 MED ORDER — DIBUCAINE 1 % RE OINT: 1.0000 "application " | TOPICAL_OINTMENT | RECTAL | Status: DC | PRN

## 2016-12-01 MED ORDER — TETANUS-DIPHTH-ACELL PERTUSSIS 5-2.5-18.5 LF-MCG/0.5 IM SUSP: 0.5000 mL | Freq: Once | INTRAMUSCULAR | Status: DC

## 2016-12-01 MED ORDER — WITCH HAZEL-GLYCERIN EX PADS: 1.0000 "application " | MEDICATED_PAD | CUTANEOUS | Status: DC | PRN

## 2016-12-01 MED ORDER — DIPHENHYDRAMINE HCL 25 MG PO CAPS: 25.0000 mg | ORAL_CAPSULE | Freq: Four times a day (QID) | ORAL | Status: DC | PRN

## 2016-12-01 MED ORDER — BENZOCAINE-MENTHOL 20-0.5 % EX AERO: 1.0000 "application " | INHALATION_SPRAY | CUTANEOUS | Status: DC | PRN

## 2016-12-01 MED ORDER — ACETAMINOPHEN 325 MG PO TABS: 650.0000 mg | ORAL_TABLET | ORAL | Status: DC | PRN

## 2016-12-01 MED ORDER — COCONUT OIL OIL: 1.0000 "application " | TOPICAL_OIL | Status: DC | PRN

## 2016-12-01 NOTE — Progress Notes (Signed)
Patient ID: Crystal EhlersElexus Hines, female   DOB: 07/01/1995, 21 y.o.   MRN: [id number]  Comfortable with epidural but feeling rectal pressure recently BPs 154/93, 142/83, 132/82, other VSS FHR 130s, +accels, no decels Ctx q 2-3 min, adequate MVUs, Pit @ 7418mu/min Cx 9/90/0 per RN  IUP@term  Active labor/transition gHTN  Plan to check cx in 2 hrs or sooner prn Anticipate SVD  Cam HaiSHAW, Vee Bahe CNM 12/01/2016 5:48 AM

## 2016-12-01 NOTE — Progress Notes (Signed)
The Rn called Dr. Mosetta PuttFeng regarding patient's blood pressure of 140/57.  Patient has history of pregnancy induced hypertension.  PIH assessment revealed reflexes 1 to 2.   Dr Mosetta PuttFeng said to be called if systolic Blood pressure is in the upper 140s. Dr Mosetta PuttFeng will discuss with attending and decide if medications will be ordered.
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
## 2016-12-02 ENCOUNTER — Encounter (HOSPITAL_COMMUNITY): Payer: Self-pay

## 2016-12-02 NOTE — Progress Notes (Signed)
POSTPARTUM PROGRESS NOTE  Post Partum Day 1 Subjective:  Crystal Hines is a 21 y.o. G2P1011 6685w2d s/p SVD  After induction for gHTN.  No acute events overnight.  Pt denies problems with ambulating, voiding or po intake.  She denies nausea or vomiting.  Pain is moderately controlled.  She has had flatus. She has not had bowel movement.  Lochia Small.   Objective: Blood pressure 135/69, pulse 78, temperature 98.7 F (37.1 C), temperature source Oral, resp. rate 18, height 5\' 8"  (1.727 m), weight 217 lb (98.4 kg), last menstrual period 02/06/2016, SpO2 100 %, unknown if currently breastfeeding.  Physical Exam:  General: alert, cooperative and no distress Lochia:normal flow Chest: no respiratory distress Heart:regular rate, distal pulses intact Abdomen: soft, nontender,  Uterine Fundus: firm, appropriately tender DVT Evaluation: No calf swelling or tenderness Extremities: trace edema   Recent Labs  11/30/16 1116 12/01/16 0656  HGB 12.4 11.6*  HCT 37.1 34.7*    Assessment/Plan:  ASSESSMENT: Crystal Hines is a 21 y.o. G2P1011 9185w2d s/p SVD after induction of labor for gHTN  #1: gHTN started on novasc 5mg  yesterday. Monitor pressures #2: history of substance use OK'd by SW for DC  Plan for discharge tomorrow   LOS: 2 days   Les Pouicholas SchenkMD 12/02/2016, 8:41 AM

## 2016-12-03 ENCOUNTER — Encounter (HOSPITAL_COMMUNITY): Payer: Self-pay

## 2016-12-03 MED ORDER — ACETAMINOPHEN 325 MG PO TABS: 650.0000 mg | ORAL_TABLET | ORAL | 0 refills | Status: DC | PRN

## 2016-12-03 MED ORDER — IBUPROFEN 600 MG PO TABS: 600.0000 mg | ORAL_TABLET | Freq: Four times a day (QID) | ORAL | 0 refills | Status: DC

## 2016-12-03 MED ORDER — AMLODIPINE BESYLATE 5 MG PO TABS: 5.0000 mg | ORAL_TABLET | Freq: Every day | ORAL | 1 refills | Status: DC

## 2016-12-03 NOTE — Anesthesia Postprocedure Evaluation (Signed)
Anesthesia Post Note  Patient: Crystal Hines  Procedure(s) Performed: * No procedures listed *     Patient location during evaluation: Mother Baby Anesthesia Type: Epidural Level of consciousness: awake and alert Pain management: pain level controlled Vital Signs Assessment: post-procedure vital signs reviewed and stable Respiratory status: spontaneous breathing, nonlabored ventilation and respiratory function stable Cardiovascular status: stable Postop Assessment: no headache, no backache and epidural receding Anesthetic complications: no    Last Vitals: There were no vitals filed for this visit.  Last Pain: There were no vitals filed for this visit. Pain Goal:                 Cecile HearingStephen Edward Turk

## 2016-12-03 NOTE — Progress Notes (Signed)
UR chart review completed.  

## 2016-12-03 NOTE — Progress Notes (Signed)
POSTPARTUM PROGRESS NOTE  Post Partum Day 2 Subjective:  Crystal Hines is a 21 y.o. G2P1011 3220w2d s/p PPD2 SVD.  No acute events overnight.  Pt denies problems with ambulating, voiding or po intake.  She denies nausea or vomiting.  Pain is well controlled.  She has had flatus. She has had bowel movement.  Lochia Minimal.   Objective: Blood pressure 139/76, pulse 78, temperature 98.5 F (36.9 C), temperature source Oral, resp. rate 18, height 5\' 8"  (1.727 m), weight 98.4 kg (217 lb), last menstrual period 02/06/2016, SpO2 100 %, unknown if currently breastfeeding.  Physical Exam:  General: alert, cooperative and no distress Lochia:normal flow Chest: CTAB Heart: RRR no m/r/g Abdomen: +BS, soft, nontender Uterine Fundus: firm DVT Evaluation: No calf swelling or tenderness Extremities: 1+ edema   Recent Labs  11/30/16 1116 12/01/16 0656  HGB 12.4 11.6*  HCT 37.1 34.7*    Assessment/Plan:  ASSESSMENT: Crystal Hines is a 21 y.o. G2P1011 6420w2d s/p PPD2 SVD.  Discharge home today. Patient would like to start Nexplanon for birth control - insert at 6 week postpartum visit. BP well controlled on Norvasc 5 mg.  Prescription will be sent to pharmacy.   LOS: 3 days   Chrissie NoaMatthew Wallman, Student-PA   OB FELLOW POSTPARTUM PROGRESS NOTE ATTESTATION  I confirm that I have verified the information documented in the resident's note and that I have also personally reperformed the physical exam and all medical decision making activities.     Ernestina PennaNicholas Ioana Louks, MD 9:31 AM

## 2016-12-03 NOTE — Discharge Summary (Signed)
OB Discharge Summary     Patient Name: Crystal Hines DOB: 1996/05/21 MRN: [id number]  Date of admission: 11/30/2016 Delivering MD: Howard Pouch   Date of discharge: 12/03/2016  Admitting diagnosis: INDUCTION Intrauterine pregnancy: [redacted]w[redacted]d     Secondary diagnosis:  Active Problems:   Chronic hypertension  Additional problems: none     Discharge diagnosis: Term Pregnancy Delivered and CHTN                                                                                                Post partum procedures:none  Augmentation: Pitocin, Cytotec and Foley Balloon  Complications: None  Hospital course:  Induction of Labor With Vaginal Delivery   21 y.o. yo G2P1011 at [redacted]w[redacted]d was admitted to the hospital 11/30/2016 for induction of labor.  Indication for induction: cHTN.  Patient had an uncomplicated labor course as follows: Membrane Rupture Time/Date: 9:57 AM ,11/30/2016   Intrapartum Procedures: Episiotomy: None [1]                                         Lacerations:  Labial [10]  Patient had delivery of a Viable infant.  Information for the patient's newborn:  Maisyn, Nouri [[id number]]  Delivery Method: Vaginal, Spontaneous Delivery (Filed from Delivery Summary)   12/01/2016  Details of delivery can be found in separate delivery note.  Patient had a routine postpartum course. Patient is discharged home 12/03/16.  Physical exam  Vitals:   12/02/16 0500 12/02/16 0757 12/02/16 1823 12/03/16 0519  BP: 135/67 135/69 122/63 139/76  Pulse: 88 78 87 78  Resp: 18 18 18 18   Temp: 98.4 F (36.9 C) 98.7 F (37.1 C) 98.4 F (36.9 C) 98.5 F (36.9 C)  TempSrc: Oral Oral  Oral  SpO2:    100%  Weight:      Height:       General: alert, cooperative and no distress Lochia: appropriate Uterine Fundus: firm Incision: N/A DVT Evaluation: No evidence of DVT seen on physical exam. Labs: Lab Results  Component Value Date   WBC 9.0 12/01/2016   HGB 11.6 (L) 12/01/2016   HCT  34.7 (L) 12/01/2016   MCV 79.2 12/01/2016   PLT 191 12/01/2016   CMP Latest Ref Rng & Units 11/07/2016  Glucose 65 - 99 mg/dL 84  BUN 6 - 20 mg/dL 7  Creatinine 4.40 - 3.47 mg/dL 4.25  Sodium 956 - 387 mmol/L 130(L)  Potassium 3.5 - 5.1 mmol/L 3.8  Chloride 101 - 111 mmol/L 101  CO2 22 - 32 mmol/L 21(L)  Calcium 8.9 - 10.3 mg/dL 8.3(L)  Total Protein 6.5 - 8.1 g/dL 7.4  Total Bilirubin 0.3 - 1.2 mg/dL 0.4  Alkaline Phos 38 - 126 U/L 108  AST 15 - 41 U/L 28  ALT 14 - 54 U/L 38    Discharge instruction: per After Visit Summary and "Baby and Me Booklet".  After visit meds:  Allergies as of 12/03/2016   No Known Allergies     Medication  List    TAKE these medications   acetaminophen 325 MG tablet Commonly known as:  TYLENOL Take 2 tablets (650 mg total) by mouth every 4 (four) hours as needed (for pain scale < 4).   amLODipine 5 MG tablet Commonly known as:  NORVASC Take 1 tablet (5 mg total) by mouth daily.   ibuprofen 600 MG tablet Commonly known as:  ADVIL,MOTRIN Take 1 tablet (600 mg total) by mouth every 6 (six) hours.   PNV PRENATAL PLUS MULTIVITAMIN 27-1 MG Tabs Take 1 tablet by mouth daily.       Diet: routine diet  Activity: Advance as tolerated. Pelvic rest for 6 weeks.   Outpatient follow up:1 wk for BP check Follow up Appt: Future Appointments Date Time Provider Department Center  01/02/2017 10:00 AM Cresenzo-Dishmon, Scarlette CalicoFrances, CNM [facility]   Follow up Visit:No Follow-up on file.  Postpartum contraception: Nexplanon  Newborn Data: Live born female  Birth Weight: 6 lb 6.5 oz (2906 g) APGAR: 9, 9  Baby Feeding: Bottle Disposition:home with mother   12/03/2016 Ernestina PennaNicholas Kimeka Badour, MD

## 2016-12-07 NOTE — Progress Notes (Signed)
CSW made  [place] CPS to intake worker, Susa DayLacy Johnson.  Infant had a positive CDS for cocaine. CPS will  Follow-up with family.  Blaine HamperAngel Boyd-Gilyard, MSW, LCSW Clinical Social Work [phone]

## 2017-01-02 ENCOUNTER — Ambulatory Visit (INDEPENDENT_AMBULATORY_CARE_PROVIDER_SITE_OTHER): Payer: Medicaid Other | Admitting: Advanced Practice Midwife

## 2017-01-02 ENCOUNTER — Encounter: Payer: Self-pay | Admitting: Advanced Practice Midwife

## 2017-01-02 NOTE — Progress Notes (Signed)
Crystal Hines is a 21 y.o. who presents for a postpartum visit. She is 4 weeks postpartum following a spontaneous vaginal delivery. I have fully reviewed the prenatal and intrapartum course. The delivery was at 39.2 gestational weeks.IOL for GHTN vs CHTN.  Was sent home on norvasc 5 mg for 2 weeks (checked at home).  Pt used cocaine while pg, baby was +, so has CPS case.  Denies using.   Anesthesia: epidural. Postpartum course has been uneventful. Baby's course has been uneventful. Baby is feeding by bottle. Bleeding: no bleeding. Bowel function is normal. Bladder function is normal. Patient is not sexually active. Contraception method is Nexplanon. Postpartum depression screening: negative.   Current Outpatient Prescriptions:  .  Prenatal Vit-Fe Fumarate-FA (PNV PRENATAL PLUS MULTIVITAMIN) 27-1 MG TABS, Take 1 tablet by mouth daily., Disp: 30 tablet, Rfl: 11 .  acetaminophen (TYLENOL) 325 MG tablet, Take 2 tablets (650 mg total) by mouth every 4 (four) hours as needed (for pain scale < 4). (Patient not taking: Reported on 01/02/2017), Disp: 30 tablet, Rfl: 0 .  amLODipine (NORVASC) 5 MG tablet, Take 1 tablet (5 mg total) by mouth daily. (Patient not taking: Reported on 01/02/2017), Disp: 30 tablet, Rfl: 1 .  ibuprofen (ADVIL,MOTRIN) 600 MG tablet, Take 1 tablet (600 mg total) by mouth every 6 (six) hours. (Patient not taking: Reported on 01/02/2017), Disp: 30 tablet, Rfl: 0  Review of Systems   Constitutional: Negative for fever and chills Eyes: Negative for visual disturbances Respiratory: Negative for shortness of breath, dyspnea Cardiovascular: Negative for chest pain or palpitations  Gastrointestinal: Negative for vomiting, diarrhea and constipation Genitourinary: Negative for dysuria and urgency Musculoskeletal: Negative for back pain, joint pain, myalgias  Neurological: Negative for dizziness and headaches    Objective:     Vitals:   01/02/17 1021  BP: 120/76  Pulse: 70   General:   alert, cooperative and no distress   Breasts:  negative  Lungs: clear to auscultation bilaterally  Heart:  regular rate and rhythm  Abdomen: Soft, nontender   Vulva:  normal  Vagina: normal vagina  Cervix:  closed  Corpus: Well involuted     Rectal Exam: no hemorrhoids        Assessment:    normal postpartum exam.  Plan:   1. Contraception: none 2. Follow up in: 3 weeks for NExplano(no sex)n or as needed.

## 2017-01-23 ENCOUNTER — Encounter: Payer: Medicaid Other | Admitting: Advanced Practice Midwife

## 2017-01-23 ENCOUNTER — Telehealth: Payer: Self-pay | Admitting: Advanced Practice Midwife

## 2017-01-23 NOTE — Telephone Encounter (Signed)
Patient called stating that she would like a call back from [place], Patient did not state the reason why. Please contact pt

## 2017-01-23 NOTE — Telephone Encounter (Signed)
Pt states that she had an appointment today to get nexplanon inserted but she forgot about the appointment. She states that she would like for Drenda FreezeFran to call her in a prescription for Birth control pills instead. I informed pt that she would have to have an appointment for that. Pt verbalized understanding.

## 2017-02-01 ENCOUNTER — Encounter: Payer: Medicaid Other | Admitting: Women's Health

## 2017-02-01 ENCOUNTER — Encounter: Payer: Self-pay | Admitting: *Deleted

## 2017-03-06 ENCOUNTER — Emergency Department (HOSPITAL_COMMUNITY)
Admission: EM | Admit: 2017-03-06 | Discharge: 2017-03-06 | Disposition: A | Discharge status: Home / Self Care | Payer: Medicaid Other | Attending: Emergency Medicine | Admitting: Emergency Medicine

## 2017-03-06 ENCOUNTER — Emergency Department (HOSPITAL_COMMUNITY): Payer: Medicaid Other

## 2017-03-06 ENCOUNTER — Encounter (HOSPITAL_COMMUNITY): Payer: Self-pay | Admitting: Emergency Medicine

## 2017-03-06 DIAGNOSIS — R202 Paresthesia of skin: Secondary | ICD-10-CM | POA: Insufficient documentation

## 2017-03-06 DIAGNOSIS — Y929 Unspecified place or not applicable: Secondary | ICD-10-CM | POA: Insufficient documentation

## 2017-03-06 DIAGNOSIS — Z87891 Personal history of nicotine dependence: Secondary | ICD-10-CM | POA: Insufficient documentation

## 2017-03-06 DIAGNOSIS — I10 Essential (primary) hypertension: Secondary | ICD-10-CM | POA: Insufficient documentation

## 2017-03-06 DIAGNOSIS — Y9383 Activity, rough housing and horseplay: Secondary | ICD-10-CM | POA: Insufficient documentation

## 2017-03-06 DIAGNOSIS — Y999 Unspecified external cause status: Secondary | ICD-10-CM | POA: Diagnosis not present

## 2017-03-06 DIAGNOSIS — S5002XA Contusion of left elbow, initial encounter: Secondary | ICD-10-CM

## 2017-03-06 DIAGNOSIS — W010XXA Fall on same level from slipping, tripping and stumbling without subsequent striking against object, initial encounter: Secondary | ICD-10-CM | POA: Insufficient documentation

## 2017-03-06 DIAGNOSIS — Z79899 Other long term (current) drug therapy: Secondary | ICD-10-CM | POA: Diagnosis not present

## 2017-03-06 DIAGNOSIS — S59902A Unspecified injury of left elbow, initial encounter: Secondary | ICD-10-CM | POA: Diagnosis present

## 2017-03-06 MED ORDER — IBUPROFEN 800 MG PO TABS: 800.0000 mg | ORAL_TABLET | Freq: Three times a day (TID) | ORAL | 0 refills | Status: DC

## 2017-03-06 NOTE — ED Triage Notes (Signed)
Pt horse playing and fell off porch onto left elbow. rom limited due to pain. No obvious deformity  Or swelling noted. Nad. Radial pulse present

## 2017-03-06 NOTE — Discharge Instructions (Signed)
Apply ice packs on and off to your elbow. Call Dr. Mort Sawyers office to arrange a follow-up appointment in one week if not improving.

## 2017-03-06 NOTE — ED Provider Notes (Signed)
[facility]PT Provider Note   CSN: [id number] Arrival date & time: 03/06/17  1338     History   Chief Complaint Chief Complaint  Patient presents with  . Elbow Pain    HPI Crystal Hines is a 21 y.o. female.  HPI  Crystal Hines is a 21 y.o. female who presents to the Emergency Department complaining of left elbow pain since earlier this morning.  States that she was horse playing with her boyfriend and fell back on her left elbow.  Complains of pain with extension and tingling to her fingers.  She has applied ice packs briefly with minimal pain relief.  She denies numbness, swelling or open wounds.  Pain does not extend to the wrist or shoulder.     Past Medical History:  Diagnosis Date  . Medical history non-contributory   . Pregnancy induced hypertension   . Pregnant 08/23/2015  . Vaginal Pap smear, abnormal     Patient Active Problem List   Diagnosis Date Noted  . Chronic hypertension 11/30/2016  . Gestational hypertension, third trimester 11/07/2016  . Cocaine abuse 10/20/2016  . Fetal echogenic intracardiac focus on prenatal ultrasound 09/06/2016  . Vaginal pain 06/29/2016  . Abnormal Pap smear of cervix 05/28/2016  . Supervision of high risk pregnancy, antepartum 05/18/2016  . Trichomonas infection 05/18/2016    Past Surgical History:  Procedure Laterality Date  . NO PAST SURGERIES      OB History    Gravida Para Term Preterm AB Living   SAB TAB Ectopic Multiple Live Births   1     0 1       Home Medications    Prior to Admission medications   Medication Sig Start Date End Date Taking? Authorizing Provider  acetaminophen (TYLENOL) 325 MG tablet Take 2 tablets (650 mg total) by mouth every 4 (four) hours as needed (for pain scale < 4). Patient not taking: Reported on 01/02/2017 12/03/16   Lorne Skeens, MD  amLODipine (NORVASC) 5 MG tablet Take 1 tablet (5 mg total) by mouth daily. Patient not taking: Reported on  01/02/2017 12/03/16   Lorne Skeens, MD  ibuprofen (ADVIL,MOTRIN) 800 MG tablet Take 1 tablet (800 mg total) by mouth 3 (three) times daily. Take with food 03/06/17   Mazie Fencl, PA-C  Prenatal Vit-Fe Fumarate-FA (PNV PRENATAL PLUS MULTIVITAMIN) 27-1 MG TABS Take 1 tablet by mouth daily. 10/17/16   Jacklyn Shell, CNM    Family History Family History  Problem Relation Age of Onset  . Hypertension Mother   . Hypertension Father   . Other Father        blood clots  . Hypertension Maternal Grandmother   . Hypertension Maternal Grandfather   . Congestive Heart Failure Paternal Grandfather     Social History Social History  Substance Use Topics  . Smoking status: Former Smoker    Years: 2.00    Types: Cigarettes  . Smokeless tobacco: Never Used  . Alcohol use No     Allergies   Patient has no known allergies.   Review of Systems Review of Systems  Constitutional: Negative for chills and fever.  Cardiovascular: Negative for chest pain.  Musculoskeletal: Positive for arthralgias (left elbow pain). Negative for back pain, joint swelling and neck pain.  Skin: Negative for color change and wound.  Neurological: Negative for dizziness, weakness and numbness (tingling to the fingers of the left hand).  All other systems reviewed and  are negative.    Physical Exam Updated Vital Signs BP (!) 141/65 (BP Location: Right Arm)   Pulse (!) 106   Temp 99.3 F (37.4 C) (Oral)   Resp 18   LMP 01/31/2017   SpO2 100%   Physical Exam  Constitutional: She is oriented to person, place, and time. She appears well-developed and well-nourished. No distress.  HENT:  Head: Normocephalic and atraumatic.  Neck: Normal range of motion. Neck supple.  Cardiovascular: Normal rate, regular rhythm and intact distal pulses.   No murmur heard. Pulmonary/Chest: Effort normal and breath sounds normal.  Musculoskeletal: She exhibits tenderness. She exhibits no edema.    Tenderness to palpation of the olecranon process of the left elbow. Pain with extension. No bony deformity or edema, no skin changes. Compartments are soft  Neurological: She is alert and oriented to person, place, and time. No sensory deficit. She exhibits normal muscle tone. Coordination normal.  Skin: Skin is warm and dry. Capillary refill takes less than 2 seconds.  Psychiatric: She has a normal mood and affect.  Nursing note and vitals reviewed.    ED Treatments / Results  Labs (all labs ordered are listed, but only abnormal results are displayed) Labs Reviewed - No data to display  EKG  EKG Interpretation None       Radiology Dg Elbow Complete Left  Result Date: 03/06/2017 CLINICAL DATA:  Fall from porch today with left elbow pain. Initial encounter. EXAM: LEFT ELBOW - COMPLETE 3+ VIEW COMPARISON:  None. FINDINGS: There is no evidence of fracture, dislocation, or joint effusion. There is no evidence of arthropathy or other focal bone abnormality. Soft tissues are unremarkable. IMPRESSION: Negative. Electronically Signed   By: Marnee Spring M.D.   On: 03/06/2017 14:10    Procedures Procedures (including critical care time)  Medications Ordered in ED Medications - No data to display   Initial Impression / Assessment and Plan / ED Course  I have reviewed the triage vital signs and the nursing notes.  Pertinent labs & imaging results that were available during my care of the patient were reviewed by me and considered in my medical decision making (see chart for details).     Xray negative for fracture, Neurovascularly intact. Compartments are soft. Likely contusion. Patient agrees to symptomatically treatment with ice and NSAID.  Orthopedic follow-up if needed.  Final Clinical Impressions(s) / ED Diagnoses   Final diagnoses:  Contusion of left elbow, initial encounter    New Prescriptions New Prescriptions   IBUPROFEN (ADVIL,MOTRIN) 800 MG TABLET    Take 1  tablet (800 mg total) by mouth 3 (three) times daily. Take with food     Pauline Aus, PA-C 03/06/17 1553    Donnetta Hutching, MD 03/07/17 1517

## 2018-02-03 ENCOUNTER — Other Ambulatory Visit: Payer: Self-pay

## 2018-02-03 ENCOUNTER — Emergency Department (HOSPITAL_COMMUNITY)
Admission: EM | Admit: 2018-02-03 | Discharge: 2018-02-03 | Disposition: A | Discharge status: Home / Self Care | Payer: Medicaid Other | Attending: Emergency Medicine | Admitting: Emergency Medicine

## 2018-02-03 ENCOUNTER — Encounter (HOSPITAL_COMMUNITY): Payer: Self-pay | Admitting: Emergency Medicine

## 2018-02-03 DIAGNOSIS — Z5321 Procedure and treatment not carried out due to patient leaving prior to being seen by health care provider: Secondary | ICD-10-CM | POA: Insufficient documentation

## 2018-02-03 DIAGNOSIS — R109 Unspecified abdominal pain: Secondary | ICD-10-CM | POA: Insufficient documentation

## 2018-02-03 DIAGNOSIS — R51 Headache: Secondary | ICD-10-CM | POA: Insufficient documentation

## 2018-02-03 DIAGNOSIS — R300 Dysuria: Secondary | ICD-10-CM | POA: Insufficient documentation

## 2018-02-03 DIAGNOSIS — R350 Frequency of micturition: Secondary | ICD-10-CM | POA: Insufficient documentation

## 2018-02-03 LAB — COMPREHENSIVE METABOLIC PANEL
ALT: 14 U/L (ref 0–44)
ANION GAP: 8 (ref 5–15)
AST: 14 U/L — ABNORMAL LOW (ref 15–41)
Albumin: 3.7 g/dL (ref 3.5–5.0)
Alkaline Phosphatase: 69 U/L (ref 38–126)
BUN: 7 mg/dL (ref 6–20)
CO2: 28 mmol/L (ref 22–32)
CREATININE: 0.82 mg/dL (ref 0.44–1.00)
Calcium: 8.8 mg/dL — ABNORMAL LOW (ref 8.9–10.3)
Chloride: 102 mmol/L (ref 98–111)
Glucose, Bld: 101 mg/dL — ABNORMAL HIGH (ref 70–99)
POTASSIUM: 3.7 mmol/L (ref 3.5–5.1)
Sodium: 138 mmol/L (ref 135–145)
Total Bilirubin: 0.6 mg/dL (ref 0.3–1.2)
Total Protein: 7.9 g/dL (ref 6.5–8.1)

## 2018-02-03 LAB — CBC
HEMATOCRIT: 37.5 % (ref 36.0–46.0)
Hemoglobin: 11.8 g/dL — ABNORMAL LOW (ref 12.0–15.0)
MCH: 25.3 pg — ABNORMAL LOW (ref 26.0–34.0)
MCHC: 31.5 g/dL (ref 30.0–36.0)
MCV: 80.3 fL (ref 78.0–100.0)
PLATELETS: 398 10*3/uL (ref 150–400)
RBC: 4.67 MIL/uL (ref 3.87–5.11)
RDW: 15.7 % — ABNORMAL HIGH (ref 11.5–15.5)
WBC: 6.7 10*3/uL (ref 4.0–10.5)

## 2018-02-03 LAB — LIPASE, BLOOD: Lipase: 60 U/L — ABNORMAL HIGH (ref 11–51)

## 2018-02-03 NOTE — ED Triage Notes (Signed)
abd pain, burning and frequent urination since last week.  Also c/o migraine since Friday

## 2018-02-05 NOTE — ED Notes (Signed)
Follow up call made  Pt states she is feeling some better  But has appointment w her pcp tomorrow  02/05/18  0825  s Laquan Ludden rn

## 2018-02-06 ENCOUNTER — Encounter: Payer: Self-pay | Admitting: Advanced Practice Midwife

## 2018-02-06 ENCOUNTER — Ambulatory Visit (INDEPENDENT_AMBULATORY_CARE_PROVIDER_SITE_OTHER): Payer: Medicaid Other | Admitting: Advanced Practice Midwife

## 2018-02-06 ENCOUNTER — Other Ambulatory Visit (HOSPITAL_COMMUNITY)
Admission: RE | Admit: 2018-02-06 | Discharge: 2018-02-06 | Disposition: A | Discharge status: Home / Self Care | Payer: Self-pay | Source: Ambulatory Visit | Attending: Advanced Practice Midwife | Admitting: Advanced Practice Midwife

## 2018-02-06 VITALS — BP 117/72 | HR 87 | Ht 68.0 in | Wt 262.0 lb

## 2018-02-06 DIAGNOSIS — Z113 Encounter for screening for infections with a predominantly sexual mode of transmission: Secondary | ICD-10-CM

## 2018-02-06 DIAGNOSIS — Z01419 Encounter for gynecological examination (general) (routine) without abnormal findings: Secondary | ICD-10-CM | POA: Insufficient documentation

## 2018-02-06 DIAGNOSIS — Z309 Encounter for contraceptive management, unspecified: Secondary | ICD-10-CM

## 2018-02-06 MED ORDER — NORETHIN-ETH ESTRAD-FE BIPHAS 1 MG-10 MCG / 10 MCG PO TABS: 1.0000 | ORAL_TABLET | Freq: Every day | ORAL | 11 refills | Status: DC

## 2018-02-06 NOTE — Patient Instructions (Signed)
Oral Contraception Information Oral contraceptive pills (OCPs) are medicines taken to prevent pregnancy. OCPs work by preventing the ovaries from releasing eggs. The hormones in OCPs also cause the cervical mucus to thicken, preventing the sperm from entering the uterus. The hormones also cause the uterine lining to become thin, not allowing a fertilized egg to attach to the inside of the uterus. OCPs are highly effective when taken exactly as prescribed. However, OCPs do not prevent sexually transmitted diseases (STDs). Safe sex practices, such as using condoms along with the pill, can help prevent STDs. Before taking the pill, you may have a physical exam and Pap test. Your health care provider may order blood tests. The health care provider will make sure you are a good candidate for oral contraception. Discuss with your health care provider the possible side effects of the OCP you may be prescribed. When starting an OCP, it can take 2 to 3 months for the body to adjust to the changes in hormone levels in your body. Types of oral contraception  The combination pill-This pill contains estrogen and progestin (synthetic progesterone) hormones. The combination pill comes in 21-day, 28-day, or 91-day packs. Some types of combination pills are meant to be taken continuously (365-day pills). With 21-day packs, you do not take pills for 7 days after the last pill. With 28-day packs, the pill is taken every day. The last 7 pills are without hormones. Certain types of pills have more than 21 hormone-containing pills. With 91-day packs, the first 84 pills contain both hormones, and the last 7 pills contain no hormones or contain estrogen only.  The minipill-This pill contains the progesterone hormone only. The pill is taken every day continuously. It is very important to take the pill at the same time each day. The minipill comes in packs of 28 pills. All 28 pills contain the hormone. Advantages of oral  contraceptive pills  Decreases premenstrual symptoms.  Treats menstrual period cramps.  Regulates the menstrual cycle.  Decreases a heavy menstrual flow.  May treatacne, depending on the type of pill.  Treats abnormal uterine bleeding.  Treats polycystic ovarian syndrome.  Treats endometriosis.  Can be used as emergency contraception. Things that can make oral contraceptive pills less effective OCPs can be less effective if:  You forget to take the pill at the same time every day.  You have a stomach or intestinal disease that lessens the absorption of the pill.  You take OCPs with other medicines that make OCPs less effective, such as antibiotics, certain HIV medicines, and some seizure medicines.  You take expired OCPs.  You forget to restart the pill on day 7, when using the packs of 21 pills.  Risks associated with oral contraceptive pills Oral contraceptive pills can sometimes cause side effects, such as:  Headache.  Nausea.  Breast tenderness.  Irregular bleeding or spotting.  Combination pills are also associated with a small increased risk of:  Blood clots.  Heart attack.  Stroke.  This information is not intended to replace advice given to you by your health care provider. Make sure you discuss any questions you have with your health care provider. Document Released: 08/18/2002 Document Revised: 11/03/2015 Document Reviewed: 11/16/2012 Elsevier Interactive Patient Education  2018 Elsevier Inc.  

## 2018-02-06 NOTE — Progress Notes (Signed)
Crystal Hines 22 y.o.  Vitals:   02/06/18 0933  BP: 117/72  Pulse: 87     Filed Weights   02/06/18 0933  Weight: 262 lb (118.8 kg)    Past Medical History: Past Medical History:  Diagnosis Date  . Medical history non-contributory   . Pregnancy induced hypertension   . Pregnant 08/23/2015  . Vaginal Pap smear, abnormal     Past Surgical History: Past Surgical History:  Procedure Laterality Date  . NO PAST SURGERIES      Family History: Family History  Problem Relation Age of Onset  . Hypertension Mother   . Hypertension Father   . Other Father        blood clots  . Hypertension Maternal Grandmother   . Hypertension Maternal Grandfather   . Congestive Heart Failure Paternal Grandfather     Social History: Social History   Tobacco Use  . Smoking status: Former Smoker    Years: 2.00    Types: Cigarettes  . Smokeless tobacco: Never Used  Substance Use Topics  . Alcohol use: No  . Drug use: No    Allergies: No Known Allergies    Current Outpatient Medications:  .  acetaminophen (TYLENOL) 325 MG tablet, Take 2 tablets (650 mg total) by mouth every 4 (four) hours as needed (for pain scale < 4)., Disp: 30 tablet, Rfl: 0 .  amLODipine (NORVASC) 5 MG tablet, Take 1 tablet (5 mg total) by mouth daily. (Patient not taking: Reported on 01/02/2017), Disp: 30 tablet, Rfl: 1 .  ibuprofen (ADVIL,MOTRIN) 800 MG tablet, Take 1 tablet (800 mg total) by mouth 3 (three) times daily. Take with food (Patient not taking: Reported on 02/06/2018), Disp: 21 tablet, Rfl: 0 .  Prenatal Vit-Fe Fumarate-FA (PNV PRENATAL PLUS MULTIVITAMIN) 27-1 MG TABS, Take 1 tablet by mouth daily. (Patient not taking: Reported on 02/06/2018), Disp: 30 tablet, Rfl: 11  History of Present Illness: Here for birth control but also need FP pap. Had abnormal pap 05/18/16 w/  colpo during pregnancy, was supposed to FU postpartum pap, did not  Hasn't been on any BC. Options discussed, wants COCs  Went to ED  Monday w/UTI sx, wasn't seen, feels better now, declines urine dip.    Review of Systems   Patient denies any headaches, blurred vision, shortness of breath, chest pain, abdominal pain, problems with bowel movements, urination, or intercourse.   Physical Exam: General:  Well developed, well nourished, no acute distress Skin:  Warm and dry Neck:  Midline trachea, normal thyroid Lungs; Clear to auscultation bilaterally Breast:  No dominant palpable mass, retraction, or nipple discharge Cardiovascular: Regular rate and rhythm Abdomen:  Soft, non tender, no hepatosplenomegaly Pelvic:  External genitalia is normal in appearance.  The vagina is normal in appearance.  The cervix is bulbous.  Uterus is felt to be normal size, shape, and contour.  No adnexal masses or tenderness noted.  Extremities:  No swelling or varicosities noted Psych:  No mood changes.     Impression: Normal GYN exam STD screening Contraception mgt   Plan: If pap normal, will still need a few nl yearlies before going to q 3 year schedule  Start LoLoestrin on first day of next period (should be next week) Consistent condom use encouraged

## 2018-02-06 NOTE — Addendum Note (Signed)
Addended by: Federico FlakeNES, Kanyla Omeara A on: 02/06/2018 10:27 AM   Modules accepted: Orders

## 2018-02-07 LAB — HIV ANTIBODY (ROUTINE TESTING W REFLEX): HIV SCREEN 4TH GENERATION: NONREACTIVE

## 2018-02-07 LAB — RPR: RPR Ser Ql: NONREACTIVE

## 2018-02-12 LAB — CYTOLOGY - PAP
CHLAMYDIA, DNA PROBE: NEGATIVE
Diagnosis: NEGATIVE
NEISSERIA GONORRHEA: NEGATIVE

## 2018-02-25 IMAGING — DX DG CHEST 2V
2 series · 2 of 2 positions shown · non-contrast
Comparison: 12/06/2014

CLINICAL DATA: Central chest pain and left shoulder pain today.

EXAM:
CHEST  2 VIEW

[chest pa]
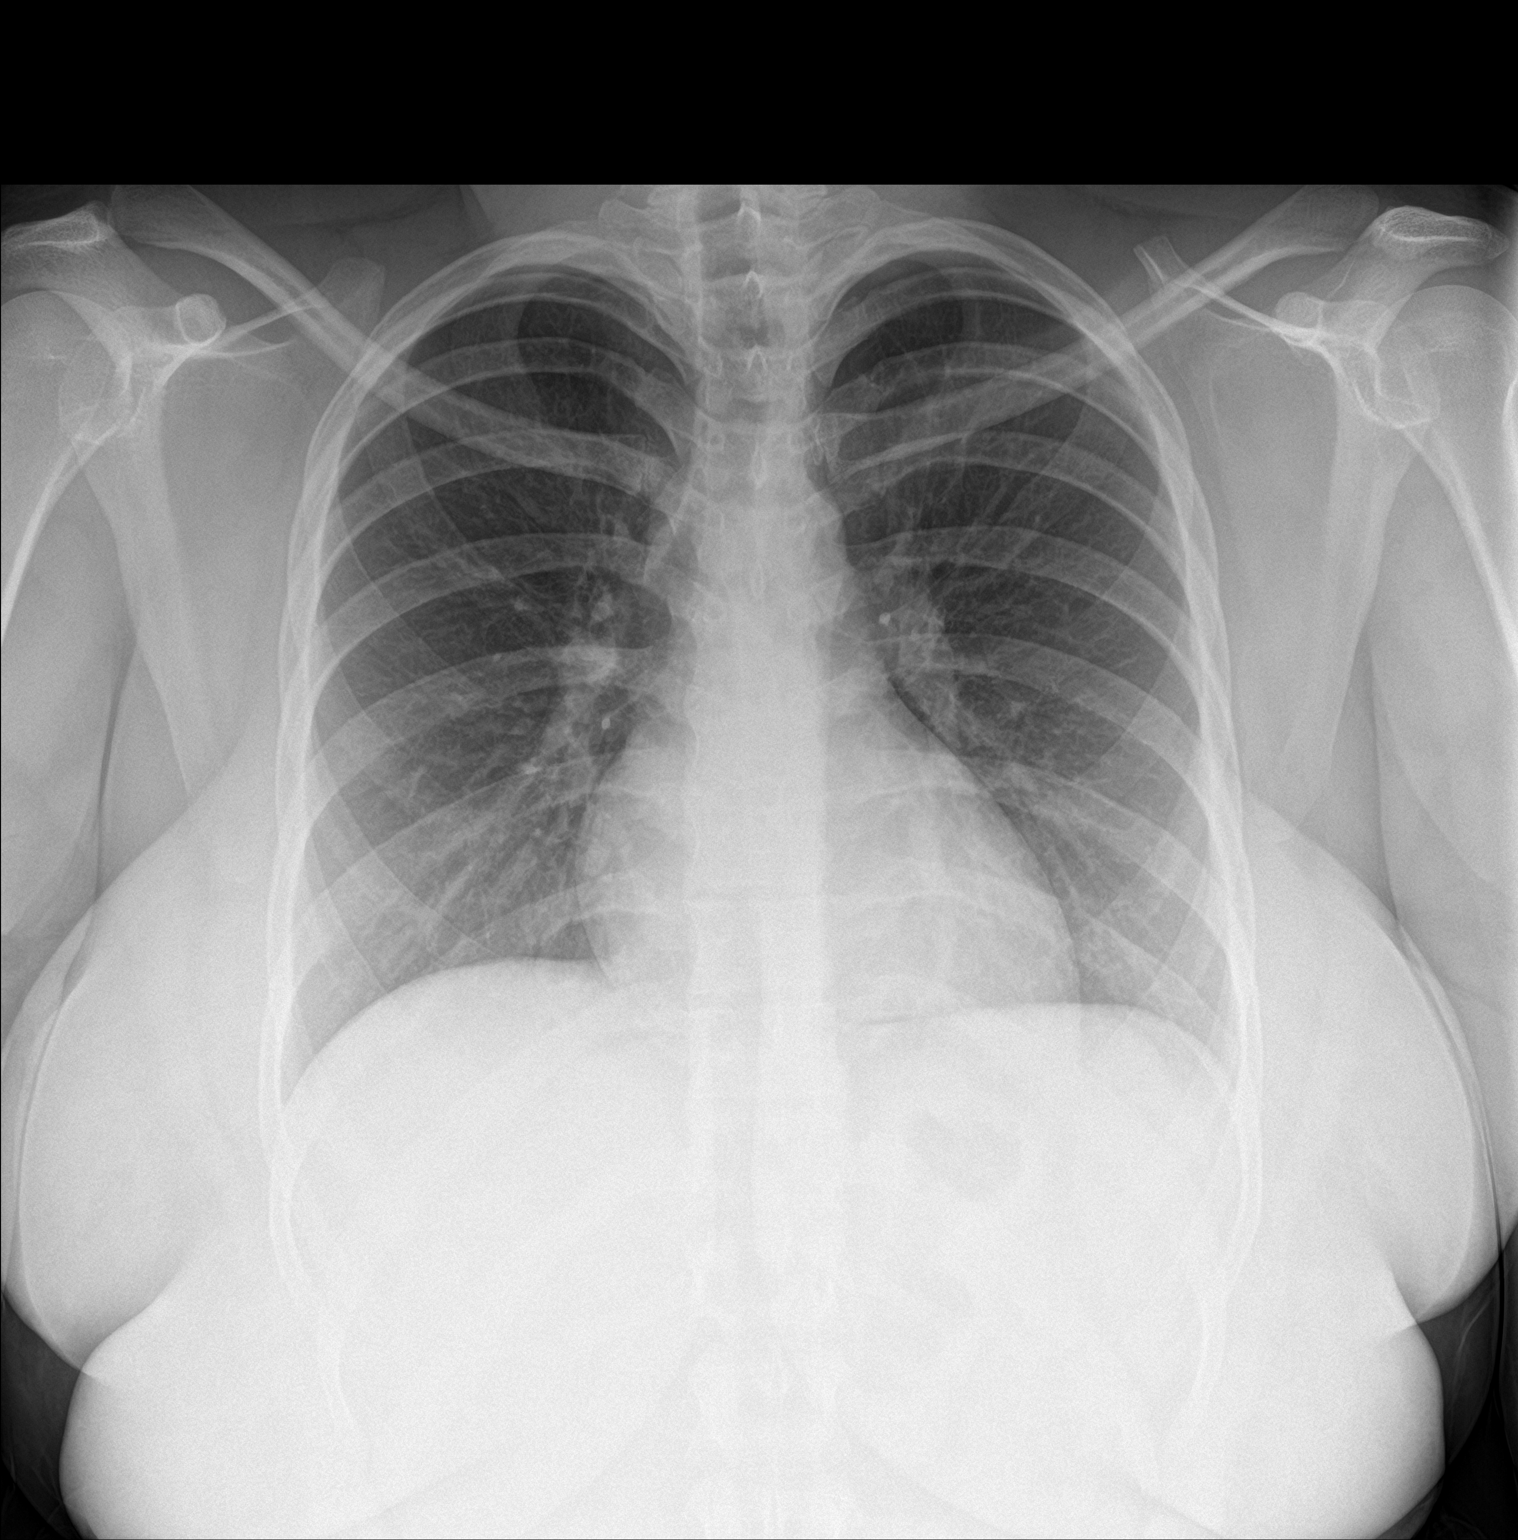

[chest lat]
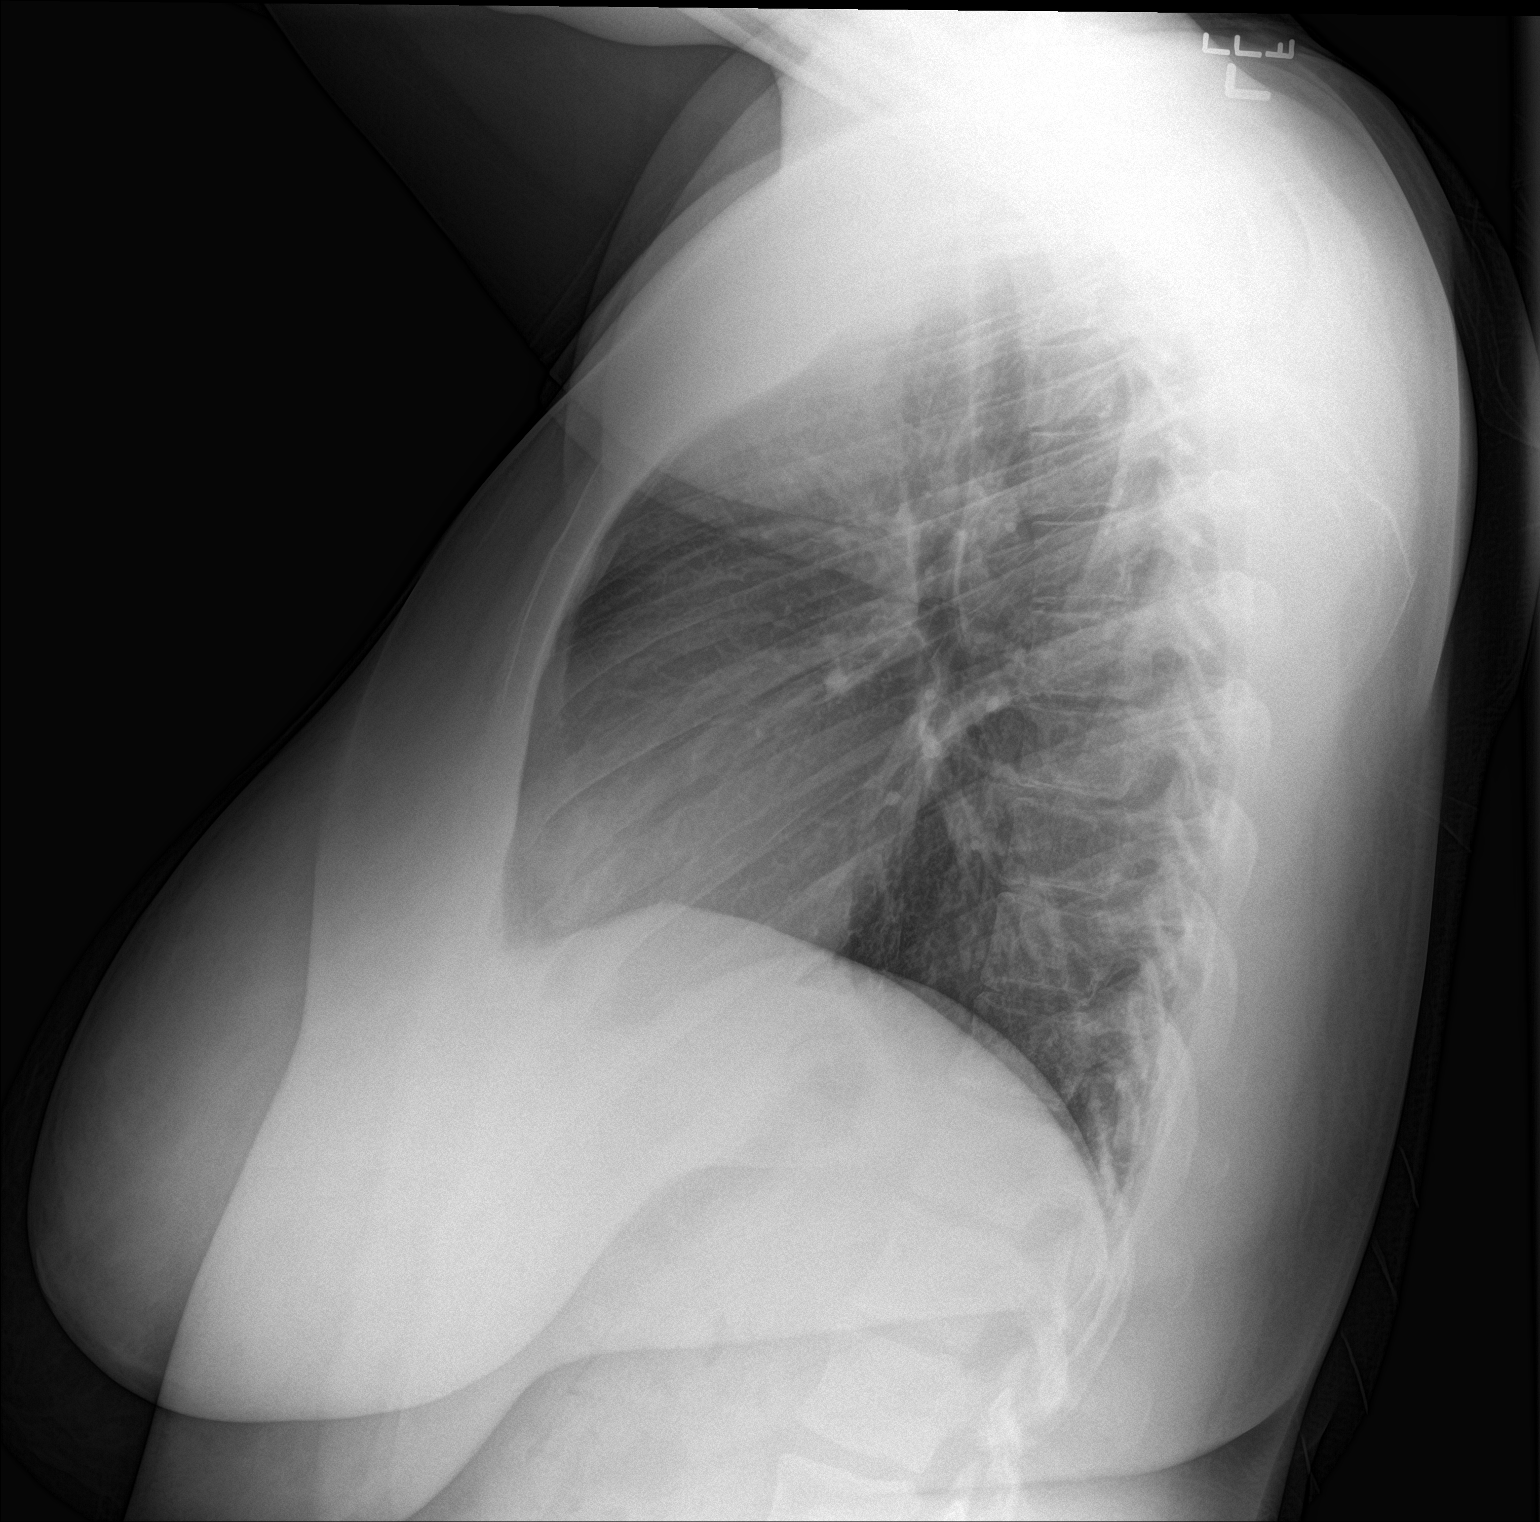

[2 of 2 positions shown; findings below may reference images not displayed]

FINDINGS: The cardiac silhouette, mediastinal and hilar contours are within
normal limits and stable. The lungs are clear. The bony thorax is
intact.
IMPRESSION: Normal chest x-ray.

## 2018-07-16 ENCOUNTER — Ambulatory Visit: Payer: Medicaid Other | Admitting: Advanced Practice Midwife

## 2019-01-20 ENCOUNTER — Telehealth (INDEPENDENT_AMBULATORY_CARE_PROVIDER_SITE_OTHER): Payer: Medicaid Other | Admitting: Advanced Practice Midwife

## 2019-01-20 ENCOUNTER — Other Ambulatory Visit: Payer: Self-pay

## 2019-01-20 ENCOUNTER — Encounter: Payer: Self-pay | Admitting: Advanced Practice Midwife

## 2019-01-20 VITALS — Ht 68.0 in | Wt 236.0 lb

## 2019-01-20 DIAGNOSIS — Z3201 Encounter for pregnancy test, result positive: Secondary | ICD-10-CM | POA: Diagnosis not present

## 2019-01-20 DIAGNOSIS — R87610 Atypical squamous cells of undetermined significance on cytologic smear of cervix (ASC-US): Secondary | ICD-10-CM

## 2019-01-20 DIAGNOSIS — Z3A01 Less than 8 weeks gestation of pregnancy: Secondary | ICD-10-CM

## 2019-01-20 MED ORDER — BLOOD PRESSURE MONITOR AUTOMAT DEVI: 0 refills | Status: DC

## 2019-01-20 MED ORDER — PROMETHAZINE HCL 25 MG PO TABS: 25.0000 mg | ORAL_TABLET | Freq: Four times a day (QID) | ORAL | 1 refills | Status: DC | PRN

## 2019-01-20 NOTE — Progress Notes (Signed)
   TELEHEALTH VIRTUAL GYNECOLOGY VISIT ENCOUNTER NOTE  I connected with Crystal Hines on 01/20/19 at  3:00 PM EDT by telephone at home and verified that I am speaking with the correct person using two identifiers.   I discussed the limitations, risks, security and privacy concerns of performing an evaluation and management service by telephone and the availability of in person appointments. I also discussed with the patient that there may be a patient responsible charge related to this service. The patient expressed understanding and agreed to proceed.   History:  Crystal Hines is a 23 y.o. G4P1011 female being evaluated today for + HPT. . She denies any abnormal vaginal discharge, bleeding, pelvic pain or other concerns.       Past Medical History:  Diagnosis Date  . Medical history non-contributory   . Pregnancy induced hypertension   . Pregnant 08/23/2015  . Vaginal Pap smear, abnormal    Past Surgical History:  Procedure Laterality Date  . NO PAST SURGERIES     The following portions of the patient's history were reviewed and updated as appropriate: allergies, current medications, past family history, past medical history, past social history, past surgical history and problem list.   Health Maintenance:  Normal pap and negative HRHPV on 02/06/18.  Had ASCUS w/HPV 2018, so ACSSP recommends 1 year f/u.  Pt has CHTN vs GHTN w/previous pregnancy.  She had elevated BP early in pg and again >37 weeks.  BPs normal since delivery, so will presume GHTN at this point.   Review of Systems:  Pertinent items noted in HPI and remainder of comprehensive ROS otherwise negative.  Physical Exam:   General:  Alert, oriented and cooperative.   Mental Status: Normal mood and affect perceived. Normal judgment and thought content.  Physical exam deferred due to nature of the encounter  Labs and Imaging No results found for this or any previous visit (from the past 336 hour(s)). No results  found.    Assessment and Plan:     7.6 weeks by LMP (EDC 3/24) Nausea_ rx phenergan Hx GHTN, rx BP cuff      I discussed the assessment and treatment plan with the patient. The patient was provided an opportunity to ask questions and all were answered. The patient agreed with the plan and demonstrated an understanding of the instructions.   The patient was advised to call back or seek an in-person evaluation/go to the ED if the symptoms worsen or if the condition fails to improve as anticipated.  I provided 10 minutes of non-face-to-face time during this encounter.   Christin Fudge, [place] for [employer], [place]

## 2019-01-26 ENCOUNTER — Ambulatory Visit (INDEPENDENT_AMBULATORY_CARE_PROVIDER_SITE_OTHER): Payer: Medicaid Other

## 2019-01-26 ENCOUNTER — Other Ambulatory Visit: Payer: Self-pay | Admitting: Obstetrics & Gynecology

## 2019-01-26 ENCOUNTER — Other Ambulatory Visit: Payer: Self-pay

## 2019-01-26 DIAGNOSIS — O30001 Twin pregnancy, unspecified number of placenta and unspecified number of amniotic sacs, first trimester: Secondary | ICD-10-CM

## 2019-01-26 DIAGNOSIS — O3680X0 Pregnancy with inconclusive fetal viability, not applicable or unspecified: Secondary | ICD-10-CM

## 2019-01-26 DIAGNOSIS — Z3A08 8 weeks gestation of pregnancy: Secondary | ICD-10-CM | POA: Diagnosis not present

## 2019-01-26 NOTE — Progress Notes (Signed)
[place] 8+5 wks,DI/DI twins,normal ovaries bilat BABY A right:CRL 17.11 mm,positive fht 155 bpm BABY B left: CRL17.47 mm,positive fht 164 bpm,

## 2019-01-27 ENCOUNTER — Other Ambulatory Visit: Payer: Self-pay | Admitting: Obstetrics & Gynecology

## 2019-01-27 DIAGNOSIS — O3680X0 Pregnancy with inconclusive fetal viability, not applicable or unspecified: Secondary | ICD-10-CM

## 2019-01-27 DIAGNOSIS — O30001 Twin pregnancy, unspecified number of placenta and unspecified number of amniotic sacs, first trimester: Secondary | ICD-10-CM

## 2019-02-05 ENCOUNTER — Other Ambulatory Visit: Payer: Self-pay

## 2019-02-05 ENCOUNTER — Encounter (HOSPITAL_COMMUNITY): Payer: Self-pay | Admitting: Emergency Medicine

## 2019-02-05 ENCOUNTER — Emergency Department (HOSPITAL_COMMUNITY): Payer: Medicaid Other

## 2019-02-05 ENCOUNTER — Telehealth: Payer: Self-pay | Admitting: *Deleted

## 2019-02-05 ENCOUNTER — Emergency Department (HOSPITAL_COMMUNITY)
Admission: EM | Admit: 2019-02-05 | Discharge: 2019-02-05 | Disposition: A | Discharge status: Home / Self Care | Payer: Medicaid Other | Attending: Emergency Medicine | Admitting: Emergency Medicine

## 2019-02-05 DIAGNOSIS — Z3A09 9 weeks gestation of pregnancy: Secondary | ICD-10-CM | POA: Diagnosis not present

## 2019-02-05 DIAGNOSIS — O200 Threatened abortion: Secondary | ICD-10-CM

## 2019-02-05 DIAGNOSIS — N939 Abnormal uterine and vaginal bleeding, unspecified: Secondary | ICD-10-CM

## 2019-02-05 DIAGNOSIS — Z79899 Other long term (current) drug therapy: Secondary | ICD-10-CM | POA: Diagnosis not present

## 2019-02-05 DIAGNOSIS — O209 Hemorrhage in early pregnancy, unspecified: Secondary | ICD-10-CM | POA: Diagnosis present

## 2019-02-05 LAB — CBC WITH DIFFERENTIAL/PLATELET
Abs Immature Granulocytes: 0.02 10*3/uL (ref 0.00–0.07)
Basophils Absolute: 0 10*3/uL (ref 0.0–0.1)
Basophils Relative: 0 %
Eosinophils Absolute: 0.1 10*3/uL (ref 0.0–0.5)
Eosinophils Relative: 1 %
HCT: 39.3 % (ref 36.0–46.0)
Hemoglobin: 12.5 g/dL (ref 12.0–15.0)
Immature Granulocytes: 0 %
Lymphocytes Relative: 35 %
Lymphs Abs: 2.1 10*3/uL (ref 0.7–4.0)
MCH: 27.2 pg (ref 26.0–34.0)
MCHC: 31.8 g/dL (ref 30.0–36.0)
MCV: 85.6 fL (ref 80.0–100.0)
Monocytes Absolute: 0.4 10*3/uL (ref 0.1–1.0)
Monocytes Relative: 6 %
Neutro Abs: 3.5 10*3/uL (ref 1.7–7.7)
Neutrophils Relative %: 58 %
Platelets: 309 10*3/uL (ref 150–400)
RBC: 4.59 MIL/uL (ref 3.87–5.11)
RDW: 15 % (ref 11.5–15.5)
WBC: 6.1 10*3/uL (ref 4.0–10.5)
nRBC: 0 % (ref 0.0–0.2)

## 2019-02-05 LAB — COMPREHENSIVE METABOLIC PANEL
ALT: 36 U/L (ref 0–44)
AST: 20 U/L (ref 15–41)
Albumin: 4 g/dL (ref 3.5–5.0)
Alkaline Phosphatase: 70 U/L (ref 38–126)
Anion gap: 10 (ref 5–15)
BUN: <5 mg/dL — ABNORMAL LOW (ref 6–20)
CO2: 23 mmol/L (ref 22–32)
Calcium: 9.3 mg/dL (ref 8.9–10.3)
Chloride: 102 mmol/L (ref 98–111)
Creatinine, Ser: 0.58 mg/dL (ref 0.44–1.00)
GFR calc Af Amer: >60 mL/min (ref >60)
GFR calc non Af Amer: >60 mL/min (ref >60)
Glucose, Bld: 86 mg/dL (ref 70–99)
Potassium: 3.7 mmol/L (ref 3.5–5.1)
Sodium: 135 mmol/L (ref 135–145)
Total Bilirubin: 0.7 mg/dL (ref 0.3–1.2)
Total Protein: 7.8 g/dL (ref 6.5–8.1)

## 2019-02-05 LAB — URINALYSIS, ROUTINE W REFLEX MICROSCOPIC
Bilirubin Urine: NEGATIVE
Glucose, UA: NEGATIVE mg/dL
Ketones, ur: 20 mg/dL — AB
Leukocytes,Ua: NEGATIVE
Nitrite: NEGATIVE
Protein, ur: 30 mg/dL — AB
Specific Gravity, Urine: 1.017 (ref 1.005–1.030)
pH: 7 (ref 5.0–8.0)

## 2019-02-05 LAB — PROTIME-INR
INR: 1.1 (ref 0.8–1.2)
Prothrombin Time: 14 seconds (ref 11.4–15.2)

## 2019-02-05 LAB — ABO/RH: ABO/RH(D): B POS

## 2019-02-05 LAB — HCG, QUANTITATIVE, PREGNANCY: hCG, Beta Chain, Quant, S: 130669 m[IU]/mL — ABNORMAL HIGH (ref <5)

## 2019-02-05 NOTE — ED Notes (Signed)
Patient denies pain and is resting comfortably.  

## 2019-02-05 NOTE — Discharge Instructions (Addendum)
Your laboratory results today was normal.  Your ultrasound showed a viable pregnancy with 2 babies.   Please schedule an appointment with your OBGYN for further management of your pregnancy.   Return if you experience any worsening symptoms, bleeding, or fevers.

## 2019-02-05 NOTE — ED Provider Notes (Signed)
[facility] EMERGENCY DEPARTMENT Provider Note   CSN: [id number] Arrival date & time: 02/05/19  1229     History   Chief Complaint Chief Complaint  Patient presents with  . Threatened Miscarriage    HPI Crystal Hines is a 23 y.o. female.     23 y.o female G3P1 currently [redacted] weeks pregnant with twins with a PMH of Cocaine abuse presents to the ED with complaints of vaginal bleeding and lower abdominal cramping.  Patient reports she noted bright red blood upon waking up this morning on her underwear.  Reports she now has a bleeding every time she wipes.  She reports she last had sexual intercourse over a week ago and this did not precipitate her symptoms.  She does report she noticed the domino discomfort around 4 AM, states she feels a lower abdominal cramping, has not tried any medication for relieving symptoms.  She is currently followed by Dr. Glo Herring, did have a new pregnancy appointment on 01/21/2019 in which an ultrasound was obtained which showed:  BABY A right:CRL 17.11 mm,positive fht 155 bpm BABY B left: CRL17.47 mm,positive fht 164 bpm, She denies any recent fevers, nausea, vomiting, chest pain or shortness of breath.  The history is provided by the patient and medical records.    Past Medical History:  Diagnosis Date  . Medical history non-contributory   . Pregnancy induced hypertension   . Pregnant 08/23/2015  . Vaginal Pap smear, abnormal     Patient Active Problem List   Diagnosis Date Noted  . Cocaine abuse ([place]) 10/20/2016  . Abnormal Pap smear of cervix 05/28/2016    Past Surgical History:  Procedure Laterality Date  . NO PAST SURGERIES       OB History    Gravida  3   Para  1   Term  1   Preterm      AB  1   Living  1     SAB  1   TAB      Ectopic      Multiple  0   Live Births  1            Home Medications    Prior to Admission medications   Medication Sig Start Date End Date Taking? Authorizing Provider  Prenatal  Vit-Fe Fumarate-FA (PNV PRENATAL PLUS MULTIVITAMIN) 27-1 MG TABS Take 1 tablet by mouth daily. 10/17/16  Yes Cresenzo-Dishmon, Joaquim Lai, CNM    Family History Family History  Problem Relation Age of Onset  . Hypertension Mother   . Hypertension Father   . Other Father        blood clots  . Hypertension Maternal Grandmother   . Hypertension Maternal Grandfather   . Congestive Heart Failure Paternal Grandfather     Social History Social History   Tobacco Use  . Smoking status: Former Smoker    Years: 2.00    Types: Cigarettes  . Smokeless tobacco: Never Used  Substance Use Topics  . Alcohol use: No  . Drug use: No     Allergies   Patient has no known allergies.   Review of Systems Review of Systems  Constitutional: Negative for chills and fever.  HENT: Negative for ear pain and sore throat.   Eyes: Negative for pain and visual disturbance.  Respiratory: Negative for cough and shortness of breath.   Cardiovascular: Negative for chest pain and palpitations.  Gastrointestinal: Positive for abdominal pain. Negative for vomiting.  Genitourinary: Positive for vaginal bleeding. Negative for dysuria  and hematuria.  Musculoskeletal: Negative for arthralgias and back pain.  Skin: Negative for color change and rash.  Neurological: Negative for seizures and syncope.  All other systems reviewed and are negative.    Physical Exam Updated Vital Signs BP 126/60 (BP Location: Left Arm)   Pulse 87   Temp 98.4 F (36.9 C) (Oral)   Resp 16   LMP 11/26/2018   SpO2 100%   Physical Exam Vitals signs and nursing note reviewed. Exam conducted with a chaperone present.  Constitutional:      General: She is not in acute distress.    Appearance: She is well-developed.  HENT:     Head: Normocephalic and atraumatic.     Mouth/Throat:     Pharynx: No oropharyngeal exudate.  Eyes:     Pupils: Pupils are equal, round, and reactive to light.  Neck:     Musculoskeletal: Normal range of  motion.  Cardiovascular:     Rate and Rhythm: Regular rhythm.     Heart sounds: Normal heart sounds.  Pulmonary:     Effort: Pulmonary effort is normal. No respiratory distress.     Breath sounds: Normal breath sounds.  Abdominal:     General: Bowel sounds are normal. There is no distension.     Palpations: Abdomen is soft.     Tenderness: There is no abdominal tenderness.  Genitourinary:    General: Normal vulva.     Vagina: Erythema and bleeding present. No tenderness.     Cervix: Normal.     Comments: Cervix appears closed, slight bleeding noted.  No discharge present. Musculoskeletal:        General: No tenderness or deformity.     Right lower leg: No edema.     Left lower leg: No edema.  Skin:    General: Skin is warm and dry.  Neurological:     Mental Status: She is alert and oriented to person, place, and time.      ED Treatments / Results  Labs (all labs ordered are listed, but only abnormal results are displayed) Labs Reviewed  COMPREHENSIVE METABOLIC PANEL - Abnormal; Notable for the following components:      Result Value   BUN <5 (*)    All other components within normal limits  CBC WITH DIFFERENTIAL/PLATELET  PROTIME-INR  HCG, QUANTITATIVE, PREGNANCY  URINALYSIS, ROUTINE W REFLEX MICROSCOPIC  ABO/RH    EKG None  Radiology [place] Ob Comp Addl Gest Less 14 Wks  Result Date: 02/05/2019 CLINICAL DATA:  23 year old pregnant female with bleeding. Estimated gestational age of [redacted] weeks 1 day by LMP. EXAM: TWIN OBSTETRIC <14WK US AND TRANSVAGINAL OB US COMPARISON:  None. FINDINGS: Number of IUPs:  2 Chorionicity/Amnionicity:  Dichorionic-diamniotic (thick membrane) TWIN 1 Yolk sac:  Visualized. Embryo:  Visualized. Cardiac Activity: Visualized. Heart Rate: 168 bpm CRL:  26.1 mm   9 w 3 d                  US EDC: 09/07/2019 TWIN 2 Yolk sac:  Visualized. Embryo:  Visualized. Cardiac Activity: Visualized. Heart Rate: 165 bpm CRL:  28.8 mm   9 w 5 d                  US  EDC: 09/05/2019 Subchorionic hemorrhage:  None visualized. Maternal uterus/adnexae: The ovaries are unremarkable. No free fluid or adnexal mass. IMPRESSION: 1. Living dichorionic diamniotic twin pregnancies. No evidence of subchorionic hemorrhage. Estimated gestational ages of 9 weeks 3 days and 96  weeks 5 days by crown-rump lengths. Electronically Signed   By: Harmon PierJeffrey  Hu M.D.   On: 02/05/2019 16:18   [place] Ob Less Than 14 Weeks With Ob Transvaginal  Result Date: 02/05/2019 CLINICAL DATA:  23 year old pregnant female with bleeding. Estimated gestational age of [redacted] weeks 1 day by LMP. EXAM: TWIN OBSTETRIC <14WK US AND TRANSVAGINAL OB US COMPARISON:  None. FINDINGS: Number of IUPs:  2 Chorionicity/Amnionicity:  Dichorionic-diamniotic (thick membrane) TWIN 1 Yolk sac:  Visualized. Embryo:  Visualized. Cardiac Activity: Visualized. Heart Rate: 168 bpm CRL:  26.1 mm   9 w 3 d                  US EDC: 09/07/2019 TWIN 2 Yolk sac:  Visualized. Embryo:  Visualized. Cardiac Activity: Visualized. Heart Rate: 165 bpm CRL:  28.8 mm   9 w 5 d                  US EDC: 09/05/2019 Subchorionic hemorrhage:  None visualized. Maternal uterus/adnexae: The ovaries are unremarkable. No free fluid or adnexal mass. IMPRESSION: 1. Living dichorionic diamniotic twin pregnancies. No evidence of subchorionic hemorrhage. Estimated gestational ages of 9 weeks 3 days and 9 weeks 5 days by crown-rump lengths. Electronically Signed   By: Harmon PierJeffrey  Hu M.D.   On: 02/05/2019 16:18    Procedures Procedures (including critical care time)  Medications Ordered in ED Medications - No data to display   Initial Impression / Assessment and Plan / ED Course  I have reviewed the triage vital signs and the nursing notes.  Pertinent labs & imaging results that were available during my care of the patient were reviewed by me and considered in my medical decision making (see chart for details).    Patient with a past medical history of cocaine abuse  presents to the ED with complaints of vaginal bleeding.  All of these began this morning around 4 AM, she is had vaginal bleeding along with lower abdominal cramping.  Had a new OB appointment 2 weeks ago, according to his chart her appointment might have been done via telehealth, she did have an ultrasound which showed a pregnancy of twins:  BABY A right:CRL 17.11 mm,positive fht 155 bpm BABY B left: CRL17.47 mm,positive fht 164 bpm,  Pelvic exam performed with chaperone at the bedside, slight bleeding noted on vaginal vault, cervix appears closed.  No abnormal discharge.  Will obtain ultrasound to further evaluate patient's condition.  Ultrasound showed: 1. Living dichorionic diamniotic twin pregnancies. No evidence of  subchorionic hemorrhage. Estimated gestational ages of 9 weeks 3  days and 9 weeks 5 days by crown-rump lengths.       Laboratory results were obtained CBC revealed no leukocytosis, hemoglobin remains unremarkable.  PT and INR stable.  CMP showed no electrolyte derangement, LFTs along with creatinine level are unremarkable.   UA showed moderate hemoglobin, some protein.  No nitrites, leukocytes, bacteria was rare with a low white blood cell count she denies any urinary symptoms at this time.  Patient's vital signs have remained unremarkable while in the ED.  I have discussed the results of her ultrasound with patient, she will need to schedule an appointment with OB/GYN as needed.  Encouraged to return if there are any changes or worsening of symptoms.  Patient understands  and agrees with management at this time.  Return precautions provided at length.  Portions of this note were generated with Scientist, clinical (histocompatibility and immunogenetics)Dragon dictation software. Dictation errors may occur despite best attempts  at proofreading.   Final Clinical Impressions(s) / ED Diagnoses   Final diagnoses:  Threatened abortion  Vaginal bleeding    ED Discharge Orders    None       Claude Manges, Cordelia Poche 02/05/19 1744     Long, Arlyss Repress, MD 02/05/19 2016

## 2019-02-05 NOTE — ED Triage Notes (Signed)
Patient states she is [redacted] weeks pregnant with twins. She began having stabbing abdominal pain with bright red bleeding this am. Patient states she had bleeding heavy enough to fill a pad. Still having sharp abdominal pad. Sent by Dr. Glo Herring.

## 2019-02-05 NOTE — Telephone Encounter (Signed)
Pt called stated that when she woke up this morning she had some spotting when she wiped and some cramping. Cramping is gone and no more spotting. Advised her to keep an eye on it and to not have sex for 7 days after bleeding stops. If bleeding becomes heavy she knows to call or go to ER.

## 2019-02-20 ENCOUNTER — Other Ambulatory Visit: Payer: Self-pay | Admitting: Obstetrics & Gynecology

## 2019-02-20 DIAGNOSIS — Z3682 Encounter for antenatal screening for nuchal translucency: Secondary | ICD-10-CM

## 2019-02-20 DIAGNOSIS — O30001 Twin pregnancy, unspecified number of placenta and unspecified number of amniotic sacs, first trimester: Secondary | ICD-10-CM

## 2019-02-23 ENCOUNTER — Encounter: Payer: Self-pay | Admitting: Women's Health

## 2019-02-23 ENCOUNTER — Ambulatory Visit: Payer: Medicaid Other | Admitting: *Deleted

## 2019-02-23 ENCOUNTER — Ambulatory Visit (INDEPENDENT_AMBULATORY_CARE_PROVIDER_SITE_OTHER): Payer: Medicaid Other | Admitting: Women's Health

## 2019-02-23 ENCOUNTER — Ambulatory Visit (INDEPENDENT_AMBULATORY_CARE_PROVIDER_SITE_OTHER): Payer: Medicaid Other

## 2019-02-23 ENCOUNTER — Other Ambulatory Visit: Payer: Self-pay

## 2019-02-23 VITALS — BP 132/70 | HR 89 | Wt 232.0 lb

## 2019-02-23 DIAGNOSIS — O0991 Supervision of high risk pregnancy, unspecified, first trimester: Secondary | ICD-10-CM

## 2019-02-23 DIAGNOSIS — Z363 Encounter for antenatal screening for malformations: Secondary | ICD-10-CM

## 2019-02-23 DIAGNOSIS — O30041 Twin pregnancy, dichorionic/diamniotic, first trimester: Secondary | ICD-10-CM

## 2019-02-23 DIAGNOSIS — Z1389 Encounter for screening for other disorder: Secondary | ICD-10-CM

## 2019-02-23 DIAGNOSIS — O0990 Supervision of high risk pregnancy, unspecified, unspecified trimester: Secondary | ICD-10-CM | POA: Diagnosis not present

## 2019-02-23 DIAGNOSIS — O09291 Supervision of pregnancy with other poor reproductive or obstetric history, first trimester: Secondary | ICD-10-CM

## 2019-02-23 DIAGNOSIS — Z8759 Personal history of other complications of pregnancy, childbirth and the puerperium: Secondary | ICD-10-CM

## 2019-02-23 DIAGNOSIS — O30001 Twin pregnancy, unspecified number of placenta and unspecified number of amniotic sacs, first trimester: Secondary | ICD-10-CM

## 2019-02-23 DIAGNOSIS — Z1379 Encounter for other screening for genetic and chromosomal anomalies: Secondary | ICD-10-CM

## 2019-02-23 DIAGNOSIS — Z331 Pregnant state, incidental: Secondary | ICD-10-CM

## 2019-02-23 DIAGNOSIS — Z3682 Encounter for antenatal screening for nuchal translucency: Secondary | ICD-10-CM

## 2019-02-23 DIAGNOSIS — Z3A12 12 weeks gestation of pregnancy: Secondary | ICD-10-CM

## 2019-02-23 LAB — POCT URINALYSIS DIPSTICK OB
Blood, UA: NEGATIVE
Glucose, UA: NEGATIVE
Leukocytes, UA: NEGATIVE
Nitrite, UA: NEGATIVE
POC,PROTEIN,UA: NEGATIVE

## 2019-02-23 MED ORDER — DOXYLAMINE-PYRIDOXINE 10-10 MG PO TBEC: DELAYED_RELEASE_TABLET | ORAL | 6 refills | Status: DC

## 2019-02-23 NOTE — Patient Instructions (Addendum)
Crystal Hines, I greatly value your feedback.  If you receive a survey following your visit with [place] today, we appreciate you taking the time to fill it out.  Thanks, Joellyn Haff, CNM, [facility]  [facility] HAS MOVED!!! It is now [facility] ([address] [place], [place] [address]) Entrance located off of [address] Free 24/7 valet parking   Begin taking 162mg  (two 81mg  tablets) baby aspirin daily to decrease risk of preeclampsia during pregnancy    Nausea & Vomiting  Have saltine crackers or pretzels by your bed and eat a few bites before you raise your head out of bed in the morning  Eat small frequent meals throughout the day instead of large meals  Drink plenty of fluids throughout the day to stay hydrated, just don't drink a lot of fluids with your meals.  This can make your stomach fill up faster making you feel sick  Do not brush your teeth right after you eat  Products with real ginger are good for nausea, like ginger ale and ginger hard candy Make sure it says made with real ginger!  Sucking on sour candy like lemon heads is also good for nausea  If your prenatal vitamins make you nauseated, take them at night so you will sleep through the nausea  Sea Bands  If you feel like you need medicine for the nausea & vomiting please let us know  If you are unable to keep any fluids or food down please let us know   Constipation  Drink plenty of fluid, preferably water, throughout the day  Eat foods high in fiber such as fruits, vegetables, and grains  Exercise, such as walking, is a good way to keep your bowels regular  Drink warm fluids, especially warm prune juice, or decaf coffee  Eat a 1/2 cup of real oatmeal (not instant), 1/2 cup applesauce, and 1/2-1 cup warm prune juice every day  If needed, you may take Colace (docusate sodium) stool softener once or twice a day to help keep the stool soft.   If you still are having  problems with constipation, you may take Miralax once daily as needed to help keep your bowels regular.   Home Blood Pressure Monitoring for Patients   Your provider has recommended that you check your blood pressure (BP) at least once a week at home. If you do not have a blood pressure cuff at home, one will be provided for you. Contact your provider if you have not received your monitor within 1 week.   Helpful Tips for Accurate Home Blood Pressure Checks  . Don't smoke, exercise, or drink caffeine 30 minutes before checking your BP . Use the restroom before checking your BP (a full bladder can raise your pressure) . Relax in a comfortable upright chair . Feet on the ground . Left arm resting comfortably on a flat surface at the level of your heart . Legs uncrossed . Back supported . Sit quietly and don't talk . Place the cuff on your bare arm . Adjust snuggly, so that only two fingertips can fit between your skin and the top of the cuff . Check 2 readings separated by at least one minute . Keep a log of your BP readings . For a visual, please reference this diagram: http://ccnc.care/bpdiagram  Provider Name: [facility]     Phone: [phone]  Zone 1: ALL CLEAR  Continue to monitor your symptoms:  . BP reading  is less than 140 (top number) or less than 90 (bottom number)  . No right upper stomach pain . No headaches or seeing spots . No feeling nauseated or throwing up . No swelling in face and hands  Zone 2: CAUTION Call your doctor's office for any of the following:  . BP reading is greater than 140 (top number) or greater than 90 (bottom number)  . Stomach pain under your ribs in the middle or right side . Headaches or seeing spots . Feeling nauseated or throwing up . Swelling in face and hands  Zone 3: EMERGENCY  Seek immediate medical care if you have any of the following:  . BP reading is greater than160 (top number) or greater than 110 (bottom number) .  Severe headaches not improving with Tylenol . Serious difficulty catching your breath . Any worsening symptoms from Zone 2    First Trimester of Pregnancy The first trimester of pregnancy is from week 1 until the end of week 12 (months 1 through 3). A week after a sperm fertilizes an egg, the egg will implant on the wall of the uterus. This embryo will begin to develop into a baby. Genes from you and your partner are forming the baby. The female genes determine whether the baby is a boy or a girl. At 6-8 weeks, the eyes and face are formed, and the heartbeat can be seen on ultrasound. At the end of 12 weeks, all the baby's organs are formed.  Now that you are pregnant, you will want to do everything you can to have a healthy baby. Two of the most important things are to get good prenatal care and to follow your health care provider's instructions. Prenatal care is all the medical care you receive before the baby's birth. This care will help prevent, find, and treat any problems during the pregnancy and childbirth. BODY CHANGES Your body goes through many changes during pregnancy. The changes vary from woman to woman.   You may gain or lose a couple of pounds at first.  You may feel sick to your stomach (nauseous) and throw up (vomit). If the vomiting is uncontrollable, call your health care provider.  You may tire easily.  You may develop headaches that can be relieved by medicines approved by your health care provider.  You may urinate more often. Painful urination may mean you have a bladder infection.  You may develop heartburn as a result of your pregnancy.  You may develop constipation because certain hormones are causing the muscles that push waste through your intestines to slow down.  You may develop hemorrhoids or swollen, bulging veins (varicose veins).  Your breasts may begin to grow larger and become tender. Your nipples may stick out more, and the tissue that surrounds them  (areola) may become darker.  Your gums may bleed and may be sensitive to brushing and flossing.  Dark spots or blotches (chloasma, mask of pregnancy) may develop on your face. This will likely fade after the baby is born.  Your menstrual periods will stop.  You may have a loss of appetite.  You may develop cravings for certain kinds of food.  You may have changes in your emotions from day to day, such as being excited to be pregnant or being concerned that something may go wrong with the pregnancy and baby.  You may have more vivid and strange dreams.  You may have changes in your hair. These can include thickening of your hair, rapid  growth, and changes in texture. Some women also have hair loss during or after pregnancy, or hair that feels dry or thin. Your hair will most likely return to normal after your baby is born. WHAT TO EXPECT AT YOUR PRENATAL VISITS During a routine prenatal visit:  You will be weighed to make sure you and the baby are growing normally.  Your blood pressure will be taken.  Your abdomen will be measured to track your baby's growth.  The fetal heartbeat will be listened to starting around week 10 or 12 of your pregnancy.  Test results from any previous visits will be discussed. Your health care provider may ask you:  How you are feeling.  If you are feeling the baby move.  If you have had any abnormal symptoms, such as leaking fluid, bleeding, severe headaches, or abdominal cramping.  If you have any questions. Other tests that may be performed during your first trimester include:  Blood tests to find your blood type and to check for the presence of any previous infections. They will also be used to check for low iron levels (anemia) and Rh antibodies. Later in the pregnancy, blood tests for diabetes will be done along with other tests if problems develop.  Urine tests to check for infections, diabetes, or protein in the urine.  An ultrasound to  confirm the proper growth and development of the baby.  An amniocentesis to check for possible genetic problems.  Fetal screens for spina bifida and Down syndrome.  You may need other tests to make sure you and the baby are doing well. HOME CARE INSTRUCTIONS  Medicines  Follow your health care provider's instructions regarding medicine use. Specific medicines may be either safe or unsafe to take during pregnancy.  Take your prenatal vitamins as directed.  If you develop constipation, try taking a stool softener if your health care provider approves. Diet  Eat regular, well-balanced meals. Choose a variety of foods, such as meat or vegetable-based protein, fish, milk and low-fat dairy products, vegetables, fruits, and whole grain breads and cereals. Your health care provider will help you determine the amount of weight gain that is right for you.  Avoid raw meat and uncooked cheese. These carry germs that can cause birth defects in the baby.  Eating four or five small meals rather than three large meals a day may help relieve nausea and vomiting. If you start to feel nauseous, eating a few soda crackers can be helpful. Drinking liquids between meals instead of during meals also seems to help nausea and vomiting.  If you develop constipation, eat more high-fiber foods, such as fresh vegetables or fruit and whole grains. Drink enough fluids to keep your urine clear or pale yellow. Activity and Exercise  Exercise only as directed by your health care provider. Exercising will help you:  Control your weight.  Stay in shape.  Be prepared for labor and delivery.  Experiencing pain or cramping in the lower abdomen or low back is a good sign that you should stop exercising. Check with your health care provider before continuing normal exercises.  Try to avoid standing for long periods of time. Move your legs often if you must stand in one place for a long time.  Avoid heavy lifting.   Wear low-heeled shoes, and practice good posture.  You may continue to have sex unless your health care provider directs you otherwise. Relief of Pain or Discomfort  Wear a good support bra for breast tenderness.  Take warm sitz baths to soothe any pain or discomfort caused by hemorrhoids. Use hemorrhoid cream if your health care provider approves.    Rest with your legs elevated if you have leg cramps or low back pain.  If you develop varicose veins in your legs, wear support hose. Elevate your feet for 15 minutes, 3-4 times a day. Limit salt in your diet. Prenatal Care  Schedule your prenatal visits by the twelfth week of pregnancy. They are usually scheduled monthly at first, then more often in the last 2 months before delivery.  Write down your questions. Take them to your prenatal visits.  Keep all your prenatal visits as directed by your health care provider. Safety  Wear your seat belt at all times when driving.  Make a list of emergency phone numbers, including numbers for family, friends, the hospital, and police and fire departments. General Tips  Ask your health care provider for a referral to a local prenatal education class. Begin classes no later than at the beginning of month 6 of your pregnancy.  Ask for help if you have counseling or nutritional needs during pregnancy. Your health care provider can offer advice or refer you to specialists for help with various needs.  Do not use hot tubs, steam rooms, or saunas.  Do not douche or use tampons or scented sanitary pads.  Do not cross your legs for long periods of time.  Avoid cat litter boxes and soil used by cats. These carry germs that can cause birth defects in the baby and possibly loss of the fetus by miscarriage or stillbirth.  Avoid all smoking, herbs, alcohol, and medicines not prescribed by your health care provider. Chemicals in these affect the formation and growth of the baby.  Schedule a dentist  appointment. At home, brush your teeth with a soft toothbrush and be gentle when you floss. SEEK MEDICAL CARE IF:   You have dizziness.  You have mild pelvic cramps, pelvic pressure, or nagging pain in the abdominal area.  You have persistent nausea, vomiting, or diarrhea.  You have a bad smelling vaginal discharge.  You have pain with urination.  You notice increased swelling in your face, hands, legs, or ankles. SEEK IMMEDIATE MEDICAL CARE IF:   You have a fever.  You are leaking fluid from your vagina.  You have spotting or bleeding from your vagina.  You have severe abdominal cramping or pain.  You have rapid weight gain or loss.  You vomit blood or material that looks like coffee grounds.  You are exposed to [place] measles and have never had them.  You are exposed to fifth disease or chickenpox.  You develop a severe headache.  You have shortness of breath.  You have any kind of trauma, such as from a fall or a car accident. Document Released: 05/22/2001 Document Revised: 10/12/2013 Document Reviewed: 04/07/2013 [facility] Patient Information 2015 [place], [place]. This information is not intended to replace advice given to you by your health care provider. Make sure you discuss any questions you have with your health care provider.  Coronavirus (COVID-19) Are you at risk?  Are you at risk for the Coronavirus (COVID-19)?  To be considered HIGH RISK for Coronavirus (COVID-19), you have to meet the following criteria:  . Traveled to [place], [place], [place], [place] or [place]; or in the [place] to [place], [place], [place], or [place]; and have fever, cough, and shortness of breath within the last 2 weeks of travel  OR . Been in close contact with a person diagnosed with COVID-19 within the last 2 weeks and have fever, cough, and shortness of breath . IF YOU DO NOT MEET THESE CRITERIA, YOU ARE CONSIDERED LOW RISK FOR COVID-19.  What to do if you are HIGH  RISK for COVID-19?  Marland Kitchen. If you are having a medical emergency, call 911. . Seek medical care right away. Before you go to a doctor's office, urgent care or emergency department, call ahead and tell them about your recent travel, contact with someone diagnosed with COVID-19, and your symptoms. You should receive instructions from your physician's office regarding next steps of care.  . When you arrive at healthcare provider, tell the healthcare staff immediately you have returned from visiting [place], [place], [place], [place] or [place]; or traveled in the [place] to [place], [place], [place], or [place]; in the last two weeks or you have been in close contact with a person diagnosed with COVID-19 in the last 2 weeks.   . Tell the health care staff about your symptoms: fever, cough and shortness of breath. . After you have been seen by a medical provider, you will be either: o Tested for (COVID-19) and discharged home on quarantine except to seek medical care if symptoms worsen, and asked to  - Stay home and avoid contact with others until you get your results (4-5 days)  - Avoid travel on public transportation if possible (such as bus, train, or airplane) or o Sent to the Emergency Department by EMS for evaluation, COVID-19 testing, and possible admission depending on your condition and test results.  What to do if you are LOW RISK for COVID-19?  Reduce your risk of any infection by using the same precautions used for avoiding the common cold or flu:  Marland Kitchen. Wash your hands often with soap and warm water for at least 20 seconds.  If soap and water are not readily available, use an alcohol-based hand sanitizer with at least 60% alcohol.  . If coughing or sneezing, cover your mouth and nose by coughing or sneezing into the elbow areas of your shirt or coat, into a tissue or into your sleeve (not your hands). . Avoid shaking hands with others and consider head nods or verbal greetings only. .  Avoid touching your eyes, nose, or mouth with unwashed hands.  . Avoid close contact with people who are sick. . Avoid places or events with large numbers of people in one location, like concerts or sporting events. . Carefully consider travel plans you have or are making. . If you are planning any travel outside or inside the [place], visit the CDC's Travelers' Health webpage for the latest health notices. . If you have some symptoms but not all symptoms, continue to monitor at home and seek medical attention if your symptoms worsen. . If you are having a medical emergency, call 911.   ADDITIONAL HEALTHCARE OPTIONS FOR PATIENTS  [facility] / e-Visit: [email or web address]         [facility]: [phone]  [facility]: [phone]                   [facility]: [phone]

## 2019-02-23 NOTE — Progress Notes (Signed)
INITIAL OBSTETRICAL VISIT Patient name: Crystal Hines MRN [id number]  Date of birth: 12-Nov-1995 Chief Complaint:   Initial Prenatal Visit (nt/it)  History of Present Illness:   Crystal Hines is a 23 y.o. 183P1011 African American female at 5865w5d by LMP c/w 8wk u/s, with an Estimated Date of Delivery: 09/02/19 being seen today for her initial obstetrical visit.   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  Today she reports some n/v, requests med.  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 Last pap 2018 ASCUS w/ +HRHPV, colpo CIN-1, repeat 02/06/18 neg, per 2019 ASCCP guidelines, needs repeat- pt doesn't want to do today Review of Systems:   Pertinent items are noted in HPI Denies cramping/contractions, leakage of fluid, vaginal bleeding, abnormal vaginal discharge w/ itching/odor/irritation, headaches, visual changes, shortness of breath, chest pain, abdominal pain, severe nausea/vomiting, or problems with urination or bowel movements unless otherwise stated above.  Pertinent History Reviewed:  Reviewed past medical,surgical, social, obstetrical and family history.  Reviewed problem list, medications and allergies. OB History  Gravida Para Term Preterm AB Living  3 1 1   1 1   SAB TAB Ectopic Multiple Live Births  1     0 1    # Outcome Date GA Lbr Len/2nd Weight Sex Delivery Anes PTL Lv  3 Current           2 Term 12/01/16 3927w2d 19:58 / 00:19 6 lb 6.5 oz (2.906 kg) F Vag-Spont EPI N LIV     Complications: Pregnancy induced hypertension  1 SAB            Physical Assessment:   Vitals:   02/23/19 1429  BP: 132/70  Pulse: 89  Weight: 232 lb (105.2 kg)  Body mass index is 35.28 kg/m.       Physical Examination:  General appearance - well appearing, and in no distress  Mental  status - alert, oriented to person, place, and time  Psych:  She has a normal mood and affect  Skin - warm and dry, normal color, no suspicious lesions noted  Chest - effort normal, all lung fields clear to auscultation bilaterally  Heart - normal rate and regular rhythm  Abdomen - soft, nontender  Extremities:  No swelling or varicosities noted  Thin prep pap is not done- pt doesn't want to do today  TODAY'S NT US DI/DI TWINS,normal ovaries bilat BABY A right,measurements c/w dates,crl 60.65 mm,fhr 152 bpm,NT 1.4 mm,NB present,posterior placenta BABY B left,measurements c/w dates,crl 63.07 mm,fhr 155 bpm,NT 1.8 mm,NB present,anterior placenta   Results for orders placed or performed in visit on 02/23/19 (from the past 24 hour(s))  POC Urinalysis Dipstick OB   Collection Time: 02/23/19  2:49 PM  Result Value Ref Range   Color, UA     Clarity, UA     Glucose, UA Negative Negative   Bilirubin, UA     Ketones, UA trace    Spec Grav, UA     Blood, UA neg    pH, UA     POC,PROTEIN,UA Negative Negative, Trace, Small (1+), Moderate (2+), Large (3+), 4+   Urobilinogen, UA     Nitrite, UA neg    Leukocytes, UA Negative Negative   Appearance     Odor  Assessment & Plan:  1) High-Risk Pregnancy G3P1011 at [redacted]w[redacted]d with an Estimated Date of Delivery: 09/02/19   2) Initial OB visit  3) Di-Di Twins  4) 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 Doxylamine-Pyridoxine (DICLEGIS) 10-10 MG TBEC    Sig: 2 tabs q hs, if sx persist add 1 tab q am on day 3, if sx persist add 1 tab q afternoon on day 4    Dispense:  100 tablet    Refill:  6    Order Specific Question:   Supervising Provider    Answer:   Florian Buff [[id number]]    Initial labs obtained Continue prenatal vitamins Reviewed n/v  relief measures and warning s/s to report Reviewed recommended weight gain based on pre-gravid BMI Encouraged well-balanced diet Genetic Screening discussed: requested nt/it, maternit21 Cystic fibrosis, SMA, Fragile X screening discussed declined Ultrasound discussed; fetal survey: requested CCNC completed>PCM not here, form faxed  Follow-up: Return in about 6 weeks (around 04/06/2019) for HROB w/ pap, 2nd IT, Twins, KZ:[id number], in person.   Orders Placed This Encounter  Procedures  . GC/Chlamydia Probe Amp  . Urine Culture  . US OB Comp + 14 Wk  . US OB Comp AddL Gest + 14 Wk  . Integrated 1  . Obstetric Panel, Including HIV  . Urinalysis, Routine w reflex microscopic  . MaterniT 21 plus Core, Blood  . Pain Management Screening Profile (10S)  . Comprehensive metabolic panel  . Protein / creatinine ratio, urine  . POC Urinalysis Dipstick OB    Roma Schanz CNM, [facility] 02/23/2019 3:25 PM

## 2019-02-23 NOTE — Progress Notes (Signed)
[place] DI/DI TWINS,normal ovaries bilat BABY A right,measurements c/w dates,crl 60.65 mm,fhr 152 bpm,NT 1.4 mm,NB present,posterior placenta BABY B left,measurements c/w dates,crl 63.07 mm,fhr 155 bpm,NT 1.8 mm,NB present,anterior placenta

## 2019-02-24 LAB — COMPREHENSIVE METABOLIC PANEL
ALT: 32 IU/L (ref 0–32)
AST: 18 IU/L (ref 0–40)
Albumin/Globulin Ratio: 1.4 (ref 1.2–2.2)
Albumin: 4.5 g/dL (ref 3.9–5.0)
Alkaline Phosphatase: 79 IU/L (ref 39–117)
BUN/Creatinine Ratio: 9 (ref 9–23)
BUN: 6 mg/dL (ref 6–20)
Bilirubin Total: 0.4 mg/dL (ref 0.0–1.2)
CO2: 21 mmol/L (ref 20–29)
Calcium: 9.6 mg/dL (ref 8.7–10.2)
Chloride: 103 mmol/L (ref 96–106)
Creatinine, Ser: 0.65 mg/dL (ref 0.57–1.00)
GFR calc Af Amer: 144 mL/min/{1.73_m2} (ref >59)
GFR calc non Af Amer: 125 mL/min/{1.73_m2} (ref >59)
Globulin, Total: 3.3 g/dL (ref 1.5–4.5)
Glucose: 77 mg/dL (ref 65–99)
Potassium: 4.1 mmol/L (ref 3.5–5.2)
Sodium: 137 mmol/L (ref 134–144)
Total Protein: 7.8 g/dL (ref 6.0–8.5)

## 2019-02-24 LAB — PROTEIN / CREATININE RATIO, URINE
Creatinine, Urine: 290.9 mg/dL
Protein, Ur: 36 mg/dL
Protein/Creat Ratio: 124 mg/g creat (ref 0–200)

## 2019-03-01 LAB — OBSTETRIC PANEL, INCLUDING HIV
Antibody Screen: NEGATIVE
Basophils Absolute: 0 10*3/uL (ref 0.0–0.2)
Basos: 0 %
EOS (ABSOLUTE): 0.1 10*3/uL (ref 0.0–0.4)
Eos: 1 %
HIV Screen 4th Generation wRfx: NONREACTIVE
Hematocrit: 40.3 % (ref 34.0–46.6)
Hemoglobin: 13 g/dL (ref 11.1–15.9)
Hepatitis B Surface Ag: NEGATIVE
Immature Grans (Abs): 0 10*3/uL (ref 0.0–0.1)
Immature Granulocytes: 0 %
Lymphocytes Absolute: 2 10*3/uL (ref 0.7–3.1)
Lymphs: 31 %
MCH: 27.3 pg (ref 26.6–33.0)
MCHC: 32.3 g/dL (ref 31.5–35.7)
MCV: 85 fL (ref 79–97)
Monocytes Absolute: 0.4 10*3/uL (ref 0.1–0.9)
Monocytes: 6 %
Neutrophils Absolute: 3.9 10*3/uL (ref 1.4–7.0)
Neutrophils: 62 %
Platelets: 316 10*3/uL (ref 150–450)
RBC: 4.76 x10E6/uL (ref 3.77–5.28)
RDW: 15.1 % (ref 11.7–15.4)
RPR Ser Ql: NONREACTIVE
Rh Factor: POSITIVE
Rubella Antibodies, IGG: 2.55 index (ref >0.99)
WBC: 6.4 10*3/uL (ref 3.4–10.8)

## 2019-03-01 LAB — MICROSCOPIC EXAMINATION
Casts: NONE SEEN /lpf
RBC, Urine: NONE SEEN /hpf (ref 0–2)

## 2019-03-01 LAB — INTEGRATED 1
Crown Rump Length Twin B: 63 mm
Crown Rump Length: 60.7 mm
Gest. Age on Collection Date: 12.6 weeks
Maternal Age at EDD: 24.8 yr
NT Twin B: 1.8 mm
Nuchal Translucency (NT): 1.4 mm
Number of Fetuses: 2
PAPP-A Value: 1119 ng/mL
Weight: 232 [lb_av]

## 2019-03-01 LAB — MATERNIT 21 PLUS CORE, BLOOD
Fetal Fraction: 7
Result (T21): NEGATIVE
Trisomy 13 (Patau syndrome): NEGATIVE
Trisomy 18 (Edwards syndrome): NEGATIVE
Trisomy 21 (Down syndrome): NEGATIVE

## 2019-03-01 LAB — URINALYSIS, ROUTINE W REFLEX MICROSCOPIC
Bilirubin, UA: NEGATIVE
Glucose, UA: NEGATIVE
Nitrite, UA: NEGATIVE
Protein,UA: NEGATIVE
RBC, UA: NEGATIVE
Specific Gravity, UA: 1.027 (ref 1.005–1.030)
Urobilinogen, Ur: 1 mg/dL (ref 0.2–1.0)
pH, UA: 5.5 (ref 5.0–7.5)

## 2019-03-02 DIAGNOSIS — Z3400 Encounter for supervision of normal first pregnancy, unspecified trimester: Secondary | ICD-10-CM | POA: Diagnosis not present

## 2019-03-06 IMAGING — DX DG FOOT COMPLETE 3+V*R*
3 series · 3 of 3 positions shown · non-contrast
Comparison: None.

CLINICAL DATA: Right foot pain and swelling after puncture wound.

EXAM:
RIGHT FOOT COMPLETE - 3+ VIEW

[foot ap]
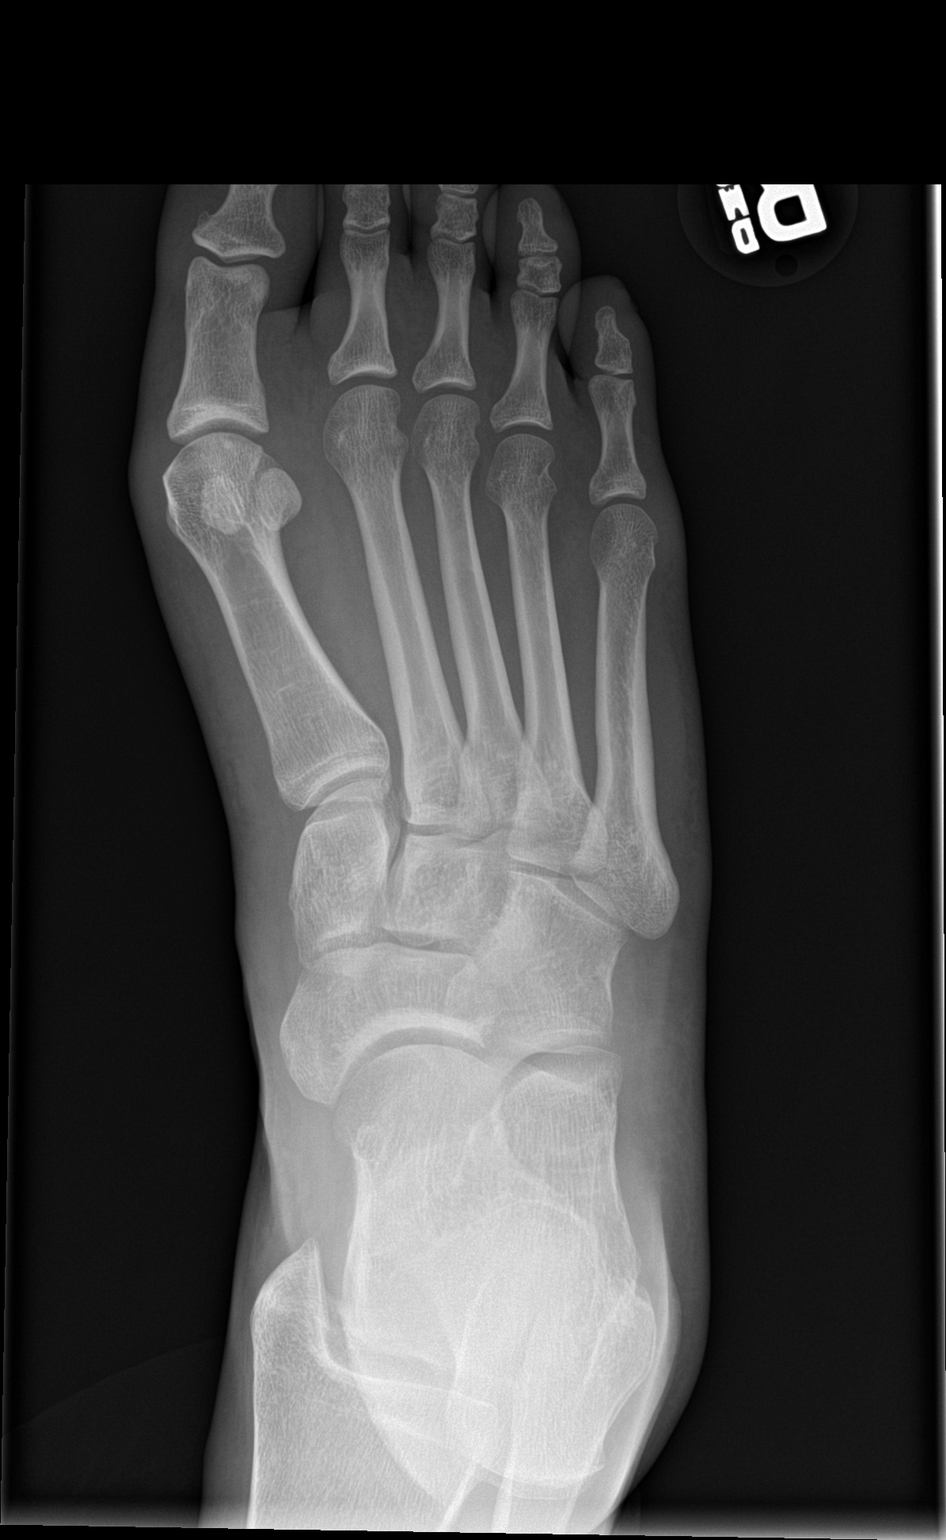

[foot obl]
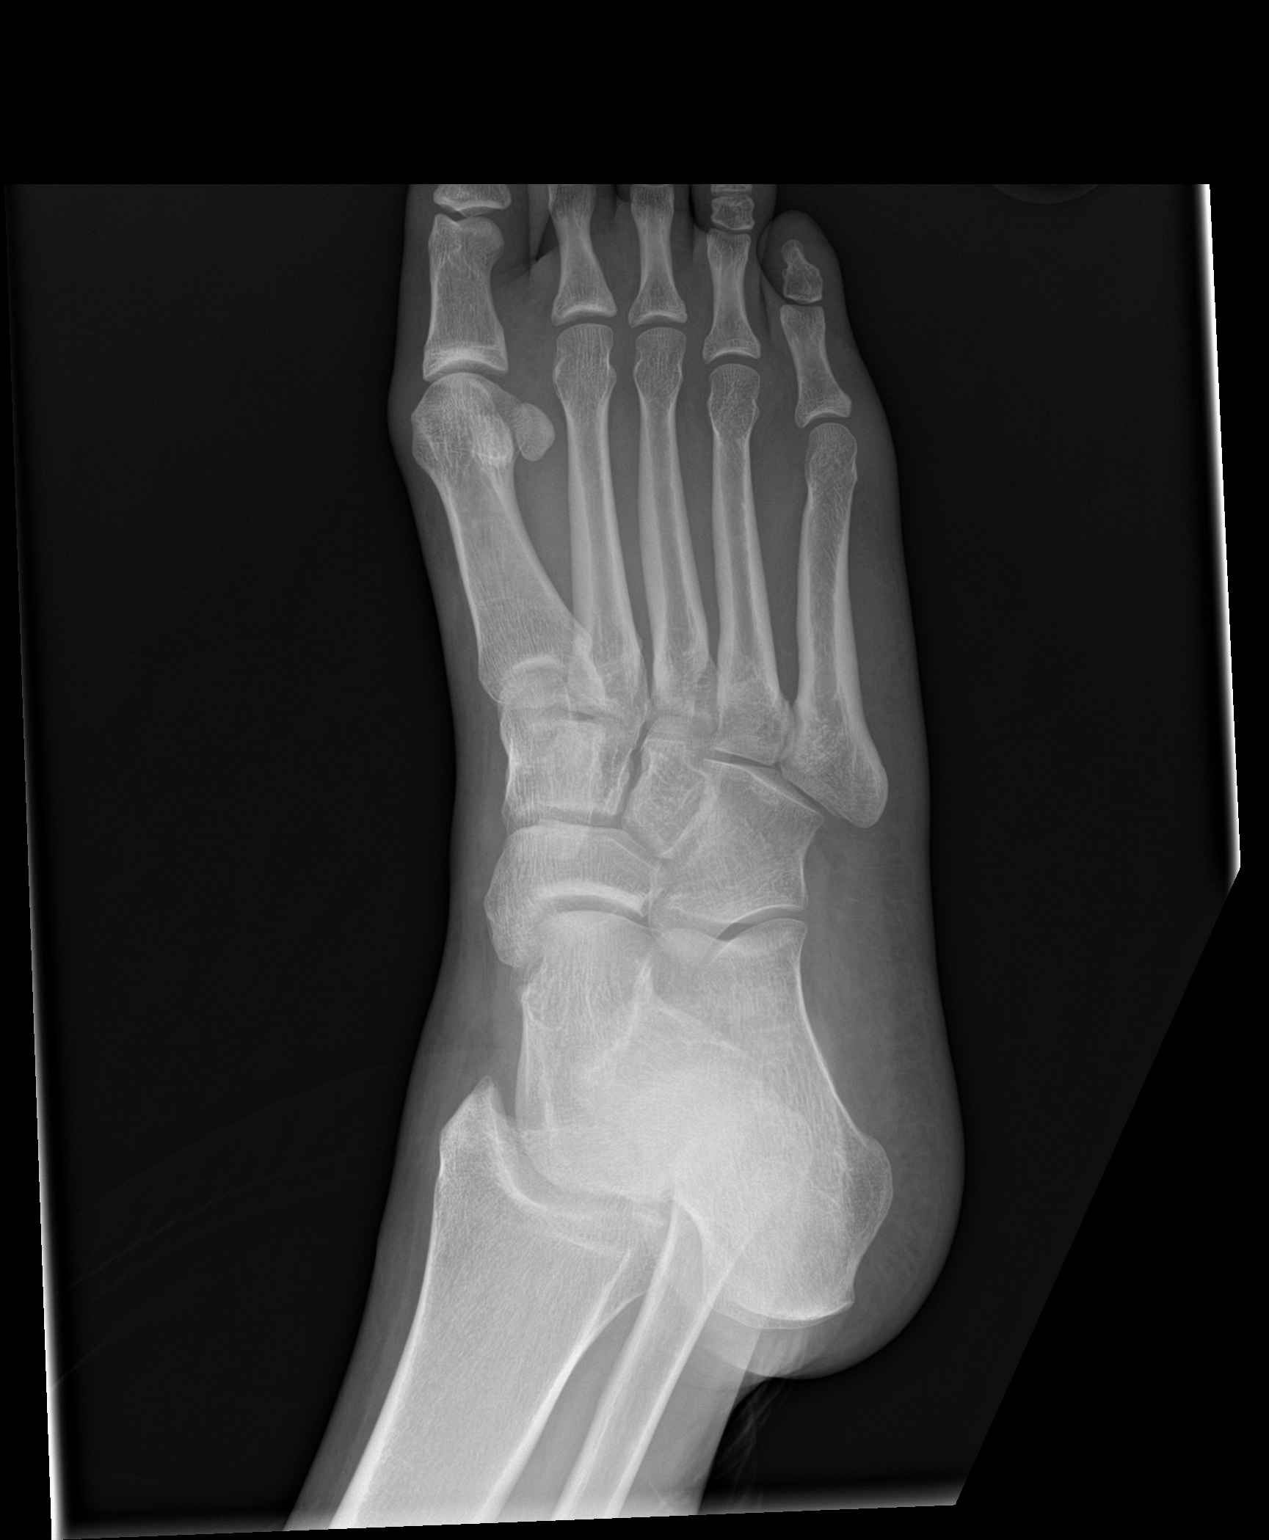

[foot lat]
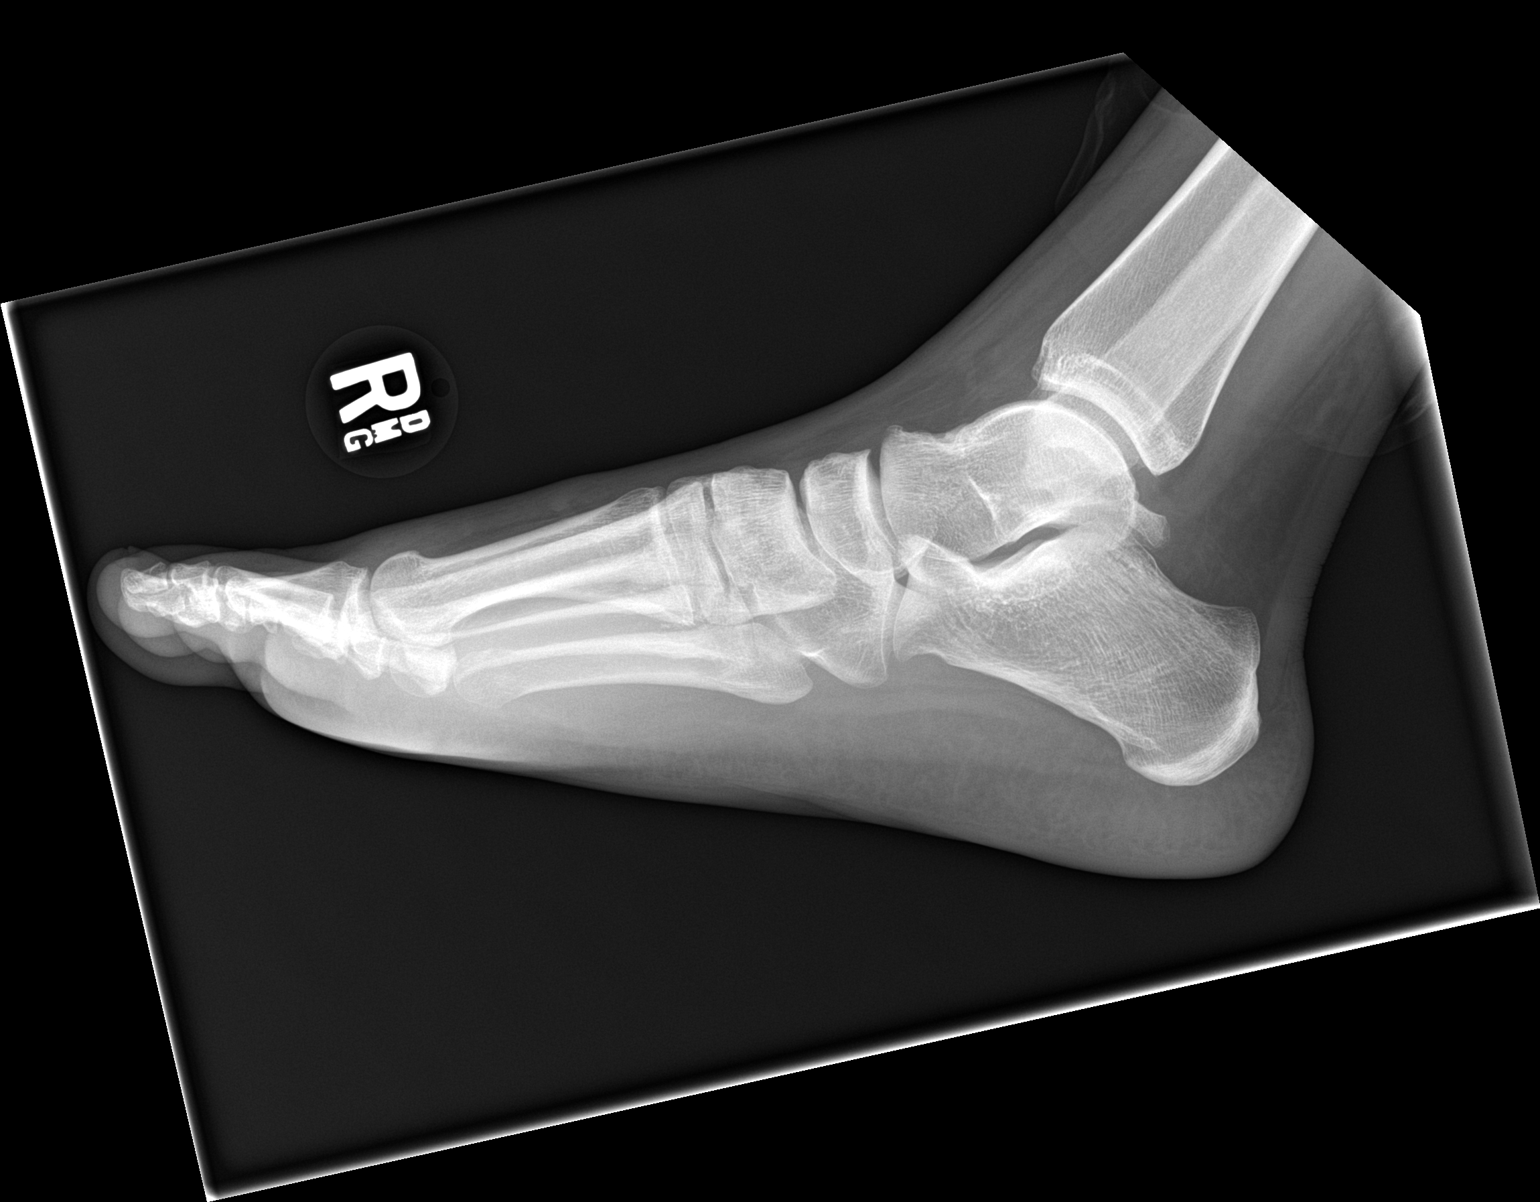

[3 of 3 positions shown; findings below may reference images not displayed]

FINDINGS: There is no evidence of fracture or dislocation. There is no
evidence of arthropathy or other focal bone abnormality. Soft
tissues are unremarkable.
IMPRESSION: Negative.

## 2019-04-06 ENCOUNTER — Encounter: Payer: Medicaid Other | Admitting: Women's Health

## 2019-04-06 ENCOUNTER — Ambulatory Visit (INDEPENDENT_AMBULATORY_CARE_PROVIDER_SITE_OTHER): Payer: Medicaid Other

## 2019-04-06 ENCOUNTER — Other Ambulatory Visit: Payer: Self-pay

## 2019-04-06 DIAGNOSIS — O30041 Twin pregnancy, dichorionic/diamniotic, first trimester: Secondary | ICD-10-CM

## 2019-04-06 DIAGNOSIS — Z363 Encounter for antenatal screening for malformations: Secondary | ICD-10-CM | POA: Diagnosis not present

## 2019-04-06 DIAGNOSIS — O0990 Supervision of high risk pregnancy, unspecified, unspecified trimester: Secondary | ICD-10-CM

## 2019-04-06 DIAGNOSIS — O30042 Twin pregnancy, dichorionic/diamniotic, second trimester: Secondary | ICD-10-CM | POA: Diagnosis not present

## 2019-04-06 DIAGNOSIS — O0991 Supervision of high risk pregnancy, unspecified, first trimester: Secondary | ICD-10-CM

## 2019-04-06 DIAGNOSIS — Z3A18 18 weeks gestation of pregnancy: Secondary | ICD-10-CM | POA: Diagnosis not present

## 2019-04-06 NOTE — Progress Notes (Signed)
[place] 18+5 wks,DI/DI twins,normal ovaries bilat,cx 3 cm BABY A: cephalic right,posterior placenta gr 0,fhr 152 bpm,svp of fluid 6.5 cm,EFW 232 g 22%,discordance 11.6%,anatomy complete,no obvious abnormalities  BABY B: cephalic left,anterior placenta gr 0,svp of fluid 5 cm,fhr 150 bpm,LVEICF 1.9 mm,263 g 55%,anatomy complete

## 2019-04-13 ENCOUNTER — Ambulatory Visit (INDEPENDENT_AMBULATORY_CARE_PROVIDER_SITE_OTHER): Payer: Medicaid Other | Admitting: Women's Health

## 2019-04-13 ENCOUNTER — Other Ambulatory Visit: Payer: Self-pay

## 2019-04-13 ENCOUNTER — Encounter: Payer: Self-pay | Admitting: Women's Health

## 2019-04-13 VITALS — BP 144/80 | HR 101 | Wt 233.6 lb

## 2019-04-13 DIAGNOSIS — Z1389 Encounter for screening for other disorder: Secondary | ICD-10-CM

## 2019-04-13 DIAGNOSIS — Z36 Encounter for antenatal screening for chromosomal anomalies: Secondary | ICD-10-CM

## 2019-04-13 DIAGNOSIS — O09292 Supervision of pregnancy with other poor reproductive or obstetric history, second trimester: Secondary | ICD-10-CM

## 2019-04-13 DIAGNOSIS — Z124 Encounter for screening for malignant neoplasm of cervix: Secondary | ICD-10-CM

## 2019-04-13 DIAGNOSIS — Z0283 Encounter for blood-alcohol and blood-drug test: Secondary | ICD-10-CM | POA: Diagnosis not present

## 2019-04-13 DIAGNOSIS — O0990 Supervision of high risk pregnancy, unspecified, unspecified trimester: Secondary | ICD-10-CM

## 2019-04-13 DIAGNOSIS — Z331 Pregnant state, incidental: Secondary | ICD-10-CM

## 2019-04-13 DIAGNOSIS — Z23 Encounter for immunization: Secondary | ICD-10-CM

## 2019-04-13 DIAGNOSIS — O0992 Supervision of high risk pregnancy, unspecified, second trimester: Secondary | ICD-10-CM

## 2019-04-13 DIAGNOSIS — O30042 Twin pregnancy, dichorionic/diamniotic, second trimester: Secondary | ICD-10-CM

## 2019-04-13 DIAGNOSIS — Z3A19 19 weeks gestation of pregnancy: Secondary | ICD-10-CM

## 2019-04-13 DIAGNOSIS — Z8759 Personal history of other complications of pregnancy, childbirth and the puerperium: Secondary | ICD-10-CM

## 2019-04-13 DIAGNOSIS — Z113 Encounter for screening for infections with a predominantly sexual mode of transmission: Secondary | ICD-10-CM

## 2019-04-13 DIAGNOSIS — Z1379 Encounter for other screening for genetic and chromosomal anomalies: Secondary | ICD-10-CM

## 2019-04-13 LAB — POCT URINALYSIS DIPSTICK OB
Blood, UA: NEGATIVE
Glucose, UA: NEGATIVE
Ketones, UA: NEGATIVE
Leukocytes, UA: NEGATIVE
Nitrite, UA: NEGATIVE

## 2019-04-13 NOTE — Progress Notes (Signed)
HIGH-RISK PREGNANCY VISIT Patient name: Crystal Hines MRN [id number]  Date of birth: Jun 03, 1996 Chief Complaint:   Routine Prenatal Visit (pap, 2nd IT)  History of Present Illness:   Crystal Hines is a 23 y.o. G66P1011 female at [redacted]w[redacted]d with an Estimated Date of Delivery: 09/02/19 being seen today for ongoing management of a high-risk pregnancy complicated by multiple gestation Di-Di Twins.  Today she reports no complaints. Has check bp twice at home, 143/59, 127/82.   . Vag. Bleeding: None.  Movement: Present x2. denies leaking of fluid.  Review of Systems:   Pertinent items are noted in HPI Denies abnormal vaginal discharge w/ itching/odor/irritation, headaches, visual changes, shortness of breath, chest pain, abdominal pain, severe nausea/vomiting, or problems with urination or bowel movements unless otherwise stated above. Pertinent History Reviewed:  Reviewed past medical,surgical, social, obstetrical and family history.  Reviewed problem list, medications and allergies. Physical Assessment:   Vitals:   04/13/19 1113 04/13/19 1145  BP: (!) 141/72 (!) 144/80  Pulse: (!) 101   Weight: 233 lb 9.6 oz (106 kg)   Body mass index is 35.52 kg/m.           Physical Examination:   General appearance: alert, well appearing, and in no distress  Mental status: alert, oriented to person, place, and time  Skin: warm & dry   Extremities: Edema: None    Cardiovascular: normal heart rate noted  Respiratory: normal respiratory effort, no distress  Abdomen: gravid, soft, non-tender  Pelvic: Cervical exam deferred         Fetal Status: Fetal Heart Rate (bpm): 147/166   Movement: Present    Fetal Surveillance Testing today: doppler   Chaperone: n/a    Results for orders placed or performed in visit on 04/13/19 (from the past 24 hour(s))  POC Urinalysis Dipstick OB   Collection Time: 04/13/19 11:44 AM  Result Value Ref Range   Color, UA     Clarity, UA     Glucose, UA Negative Negative    Bilirubin, UA     Ketones, UA neg    Spec Grav, UA     Blood, UA neg    pH, UA     POC,PROTEIN,UA Trace Negative, Trace, Small (1+), Moderate (2+), Large (3+), 4+   Urobilinogen, UA     Nitrite, UA neg    Leukocytes, UA Negative Negative   Appearance     Odor      Assessment & Plan:  1) High-risk pregnancy G3P1011 at [redacted]w[redacted]d with an Estimated Date of Delivery: 09/02/19   2) Di-Di Twins, stable, had anatomy u/s 10/26, discrepancy 11.5%, had to cancel appt that day  3) Elevated bp today> h/o GHTN, pt to check bp QID at home, send me pic via mychart in 1wk. Continue ASA 162mg  daily  4) H/O abnormal pap> last pap normal, per ASCCP guidelines needs repeat, has to be at meeting at 12 today, so will do at next visit  Meds: No orders of the defined types were placed in this encounter.   Labs/procedures today: flu shot, 2nd IT  Treatment Plan:  U/S q4wks    2x/wk testing @ 36wks or weekly BPP    Deliver @ 38wks  Reviewed: Preterm labor symptoms and general obstetric precautions including but not limited to vaginal bleeding, contractions, leaking of fluid and fetal movement were reviewed in detail with the patient.  All questions were answered.   Follow-up: Return in about 3 weeks (around 05/04/2019) for HROB w/ pap, Twins, US:EFW.  Orders Placed This Encounter  Procedures  . Urine Culture  . GC/Chlamydia Probe Amp  . US OB Follow Up  . US OB Follow Up AddL Gest  . Flu Vaccine QUAD 36+ mos IM  . Pain Management Screening Profile (10S)  . INTEGRATED 2  . POC Urinalysis Dipstick OB   Roma Schanz CNM, [facility] 04/13/2019 11:46 AM

## 2019-04-13 NOTE — Patient Instructions (Addendum)
Crystal Hines, I greatly value your feedback.  If you receive a survey following your visit with [place] today, we appreciate you taking the time to fill it out.  Thanks, Knute Neu, CNM, [facility]  [place]!!! It is now [place] at [facility] ([place], [place] [address]) Entrance located off of [address] parking   Check your blood pressure 4 times daily and keep a log, bring this with you to all appointments.  If blood pressure is >=160 on top or >=110 on bottom, check again in 5 minutes, if still this high, call us or go to [facility] to be evaluated  Send me a picture via MyChart next Monday   Go to [email or web address] to register for FREE online childbirth classes  [place] Pediatricians/Family Doctors:  [facility] [place] [phone]                 [place] [phone] (usually not accepting new patients unless you have family there already, you are always welcome to call and ask)       [facility] Department [phone]       [facility] Pediatricians/Family Doctors:   [facility]: [phone]  [facility]: [phone]  [facility]: [place] Doctors:   [facility]: [facility]: [place]:  [place]: [phone]    Home Blood Pressure Monitoring for Patients   Your provider has recommended that you check your blood pressure (BP) at least once a week at home. If you do not have a blood pressure cuff at home, one will be provided for you. Contact your provider if you have not received your monitor within 1 week.   Helpful Tips for Accurate Home Blood Pressure Checks   Don't smoke, exercise, or drink caffeine 30 minutes before checking your BP  Use  the restroom before checking your BP (a full bladder can raise your pressure)  Relax in a comfortable upright chair  Feet on the ground  Left arm resting comfortably on a flat surface at the level of your heart  Legs uncrossed  Back supported  Sit quietly and don't talk  Place the cuff on your bare arm  Adjust snuggly, so that only two fingertips can fit between your skin and the top of the cuff  Check 2 readings separated by at least one minute  Keep a log of your BP readings  For a visual, please reference this diagram: http://ccnc.care/bpdiagram  Provider Name: [facility]     Phone: [phone]  Zone 1: ALL CLEAR  Continue to monitor your symptoms:   BP reading is less than 140 (top number) or less than 90 (bottom number)   No right upper stomach pain  No headaches or seeing spots  No feeling nauseated or throwing up  No swelling in face and hands  Zone 2: CAUTION Call your doctor's office for any of the following:   BP reading is greater than 140 (top number) or greater than 90 (bottom number)   Stomach pain under your ribs in the middle or right side  Headaches or seeing spots  Feeling nauseated or throwing up  Swelling in face and hands  Zone 3: EMERGENCY  Seek immediate medical care if you have any of the following:  BP reading is greater than160 (top number) or greater than 110 (bottom number)  Severe headaches not improving with Tylenol  Serious difficulty catching your breath  Any worsening symptoms from Zone 2     Second Trimester of Pregnancy The second trimester is from week 14 through week 27 (months 4 through 6). The second trimester is often a time when you feel your best. Your body has adjusted to being pregnant, and you begin to feel better physically. Usually, morning sickness has lessened or quit completely, you may have more energy, and you may have an increase in appetite. The second trimester is also a time when the  fetus is growing rapidly. At the end of the sixth month, the fetus is about 9 inches long and weighs about 1 pounds. You will likely begin to feel the baby move (quickening) between 16 and 20 weeks of pregnancy. Body changes during your second trimester Your body continues to go through many changes during your second trimester. The changes vary from woman to woman.  Your weight will continue to increase. You will notice your lower abdomen bulging out.  You may begin to get stretch marks on your hips, abdomen, and breasts.  You may develop headaches that can be relieved by medicines. The medicines should be approved by your health care provider.  You may urinate more often because the fetus is pressing on your bladder.  You may develop or continue to have heartburn as a result of your pregnancy.  You may develop constipation because certain hormones are causing the muscles that push waste through your intestines to slow down.  You may develop hemorrhoids or swollen, bulging veins (varicose veins).  You may have back pain. This is caused by: ? Weight gain. ? Pregnancy hormones that are relaxing the joints in your pelvis. ? A shift in weight and the muscles that support your balance.  Your breasts will continue to grow and they will continue to become tender.  Your gums may bleed and may be sensitive to brushing and flossing.  Dark spots or blotches (chloasma, mask of pregnancy) may develop on your face. This will likely fade after the baby is born.  A dark line from your belly button to the pubic area (linea nigra) may appear. This will likely fade after the baby is born.  You may have changes in your hair. These can include thickening of your hair, rapid growth, and changes in texture. Some women also have hair loss during or after pregnancy, or hair that feels dry or thin. Your hair will most likely return to normal after your baby is born.  What to expect at prenatal  visits During a routine prenatal visit:  You will be weighed to make sure you and the fetus are growing normally.  Your blood pressure will be taken.  Your abdomen will be measured to track your baby's growth.  The fetal heartbeat will be listened to.  Any test results from the previous visit will be discussed.  Your health care provider may ask you:  How you are feeling.  If you are feeling the baby move.  If you have had any abnormal symptoms, such as leaking fluid, bleeding, severe headaches, or abdominal cramping.  If you are using any tobacco products, including cigarettes, chewing tobacco, and electronic cigarettes.  If you have any questions.  Other tests that may be performed during your second trimester include:  Blood tests that check for: ? Low iron levels (anemia). ? High  blood sugar that affects pregnant women (gestational diabetes) between 6324 and 28 weeks. ? Rh antibodies. This is to check for a protein on red blood cells (Rh factor).  Urine tests to check for infections, diabetes, or protein in the urine.  An ultrasound to confirm the proper growth and development of the baby.  An amniocentesis to check for possible genetic problems.  Fetal screens for spina bifida and Down syndrome.  HIV (human immunodeficiency virus) testing. Routine prenatal testing includes screening for HIV, unless you choose not to have this test.  Follow these instructions at home: Medicines  Follow your health care provider's instructions regarding medicine use. Specific medicines may be either safe or unsafe to take during pregnancy.  Take a prenatal vitamin that contains at least 600 micrograms (mcg) of folic acid.  If you develop constipation, try taking a stool softener if your health care provider approves. Eating and drinking  Eat a balanced diet that includes fresh fruits and vegetables, whole grains, good sources of protein such as meat, eggs, or tofu, and low-fat  dairy. Your health care provider will help you determine the amount of weight gain that is right for you.  Avoid raw meat and uncooked cheese. These carry germs that can cause birth defects in the baby.  If you have low calcium intake from food, talk to your health care provider about whether you should take a daily calcium supplement.  Limit foods that are high in fat and processed sugars, such as fried and sweet foods.  To prevent constipation: ? Drink enough fluid to keep your urine clear or pale yellow. ? Eat foods that are high in fiber, such as fresh fruits and vegetables, whole grains, and beans. Activity  Exercise only as directed by your health care provider. Most women can continue their usual exercise routine during pregnancy. Try to exercise for 30 minutes at least 5 days a week. Stop exercising if you experience uterine contractions.  Avoid heavy lifting, wear low heel shoes, and practice good posture.  A sexual relationship may be continued unless your health care provider directs you otherwise. Relieving pain and discomfort  Wear a good support bra to prevent discomfort from breast tenderness.  Take warm sitz baths to soothe any pain or discomfort caused by hemorrhoids. Use hemorrhoid cream if your health care provider approves.  Rest with your legs elevated if you have leg cramps or low back pain.  If you develop varicose veins, wear support hose. Elevate your feet for 15 minutes, 3-4 times a day. Limit salt in your diet. Prenatal Care  Write down your questions. Take them to your prenatal visits.  Keep all your prenatal visits as told by your health care provider. This is important. Safety  Wear your seat belt at all times when driving.  Make a list of emergency phone numbers, including numbers for family, friends, the hospital, and police and fire departments. General instructions  Ask your health care provider for a referral to a local prenatal education  class. Begin classes no later than the beginning of month 6 of your pregnancy.  Ask for help if you have counseling or nutritional needs during pregnancy. Your health care provider can offer advice or refer you to specialists for help with various needs.  Do not use hot tubs, steam rooms, or saunas.  Do not douche or use tampons or scented sanitary pads.  Do not cross your legs for long periods of time.  Avoid cat litter boxes and soil  used by cats. These carry germs that can cause birth defects in the baby and possibly loss of the fetus by miscarriage or stillbirth.  Avoid all smoking, herbs, alcohol, and unprescribed drugs. Chemicals in these products can affect the formation and growth of the baby.  Do not use any products that contain nicotine or tobacco, such as cigarettes and e-cigarettes. If you need help quitting, ask your health care provider.  Visit your dentist if you have not gone yet during your pregnancy. Use a soft toothbrush to brush your teeth and be gentle when you floss. Contact a health care provider if:  You have dizziness.  You have mild pelvic cramps, pelvic pressure, or nagging pain in the abdominal area.  You have persistent nausea, vomiting, or diarrhea.  You have a bad smelling vaginal discharge.  You have pain when you urinate. Get help right away if:  You have a fever.  You are leaking fluid from your vagina.  You have spotting or bleeding from your vagina.  You have severe abdominal cramping or pain.  You have rapid weight gain or weight loss.  You have shortness of breath with chest pain.  You notice sudden or extreme swelling of your face, hands, ankles, feet, or legs.  You have not felt your baby move in over an hour.  You have severe headaches that do not go away when you take medicine.  You have vision changes. Summary  The second trimester is from week 14 through week 27 (months 4 through 6). It is also a time when the fetus is  growing rapidly.  Your body goes through many changes during pregnancy. The changes vary from woman to woman.  Avoid all smoking, herbs, alcohol, and unprescribed drugs. These chemicals affect the formation and growth your baby.  Do not use any tobacco products, such as cigarettes, chewing tobacco, and e-cigarettes. If you need help quitting, ask your health care provider.  Contact your health care provider if you have any questions. Keep all prenatal visits as told by your health care provider. This is important. This information is not intended to replace advice given to you by your health care provider. Make sure you discuss any questions you have with your health care provider. Document Released: 05/22/2001 Document Revised: 11/03/2015 Document Reviewed: 07/29/2012 Elsevier Interactive Patient Education  2017 Elsevier Inc.   PROTECT YOURSELF & YOUR BABY FROM THE FLU! Because you are pregnant, we at [facility], along with the Centers for Disease Control (CDC), recommend that you receive the flu vaccine to protect yourself and your baby from the flu. The flu is more likely to cause severe illness in pregnant women than in women of reproductive age who are not pregnant. Changes in the immune system, heart, and lungs during pregnancy make pregnant women (and women up to two weeks postpartum) more prone to severe illness from flu, including illness resulting in hospitalization. Flu also may be harmful for a pregnant womans developing baby. A common flu symptom is fever, which may be associated with neural tube defects and other adverse outcomes for a developing baby. Getting vaccinated can also help protect a baby after birth from flu. (Mom passes antibodies onto the developing baby during her pregnancy.)  A Flu Vaccine is the Best Protection Against Flu Getting a flu vaccine is the first and most important step in protecting against flu. Pregnant women should get a flu shot and not the live  attenuated influenza vaccine (LAIV), also known as nasal spray  flu vaccine. Flu vaccines given during pregnancy help protect both the mother and her baby from flu. Vaccination has been shown to reduce the risk of flu-associated acute respiratory infection in pregnant women by up to one-half. A 2018 study showed that getting a flu shot reduced a pregnant womans risk of being hospitalized with flu by an average of 40 percent. Pregnant women who get a flu vaccine are also helping to protect their babies from flu illness for the first several months after their birth, when they are too young to get vaccinated.   A Long Record of Safety for Flu Shots in Pregnant Women Flu shots have been given to millions of pregnant women over many years with a good safety record. There is a lot of evidence that flu vaccines can be given safely during pregnancy; though these data are limited for the first trimester. The CDC recommends that pregnant women get vaccinated during any trimester of their pregnancy. It is very important for pregnant women to get the flu shot.   Other Preventive Actions In addition to getting a flu shot, pregnant women should take the same everyday preventive actions the CDC recommends of everyone, including covering coughs, washing hands often, and avoiding people who are sick.  Symptoms and Treatment If you get sick with flu symptoms call your doctor right away. There are antiviral drugs that can treat flu illness and prevent serious flu complications. The CDC recommends prompt treatment for people who have influenza infection or suspected influenza infection and who are at high risk of serious flu complications, such as people with asthma, diabetes (including gestational diabetes), or heart disease. Early treatment of influenza in hospitalized pregnant women has been shown to reduce the length of the hospital stay.  Symptoms Flu symptoms include fever, cough, sore throat, runny or stuffy nose,  body aches, headache, chills and fatigue. Some people may also have vomiting and diarrhea. People may be infected with the flu and have respiratory symptoms without a fever.  Early Treatment is Important for Pregnant Women Treatment should begin as soon as possible because antiviral drugs work best when started early (within 48 hours after symptoms start). Antiviral drugs can make your flu illness milder and make you feel better faster. They may also prevent serious health problems that can result from flu illness. Oral oseltamivir (Tamiflu) is the preferred treatment for pregnant women because it has the most studies available to suggest that it is safe and beneficial. Antiviral drugs require a prescription from your provider. Having a fever caused by flu infection or other infections early in pregnancy may be linked to birth defects in a baby. In addition to taking antiviral drugs, pregnant women who get a fever should treat their fever with Tylenol (acetaminophen) and contact their provider immediately.  When to Seek Emergency Medical Care If you are pregnant and have any of these signs, seek care immediately:  Difficulty breathing or shortness of breath  Pain or pressure in the chest or abdomen  Sudden dizziness  Confusion  Severe or persistent vomiting  High fever that is not responding to Tylenol (or store brand equivalent)  Decreased or no movement of your baby  [email or web address].[email or web address]

## 2019-04-14 LAB — PMP SCREEN PROFILE (10S), URINE
Amphetamine Scrn, Ur: NEGATIVE ng/mL
BARBITURATE SCREEN URINE: NEGATIVE ng/mL
BENZODIAZEPINE SCREEN, URINE: NEGATIVE ng/mL
CANNABINOIDS UR QL SCN: POSITIVE ng/mL — AB
Cocaine (Metab) Scrn, Ur: POSITIVE ng/mL — AB
Creatinine(Crt), U: 352.7 mg/dL — ABNORMAL HIGH (ref 20.0–300.0)
Methadone Screen, Urine: NEGATIVE ng/mL
OXYCODONE+OXYMORPHONE UR QL SCN: NEGATIVE ng/mL
Opiate Scrn, Ur: NEGATIVE ng/mL
Ph of Urine: 5.7 (ref 4.5–8.9)
Phencyclidine Qn, Ur: NEGATIVE ng/mL
Propoxyphene Scrn, Ur: NEGATIVE ng/mL

## 2019-04-14 LAB — SPECIMEN STATUS REPORT

## 2019-05-12 ENCOUNTER — Other Ambulatory Visit: Payer: Self-pay

## 2019-05-12 ENCOUNTER — Ambulatory Visit (INDEPENDENT_AMBULATORY_CARE_PROVIDER_SITE_OTHER): Payer: Medicaid Other | Admitting: Obstetrics & Gynecology

## 2019-05-12 ENCOUNTER — Encounter: Payer: Self-pay | Admitting: Obstetrics & Gynecology

## 2019-05-12 ENCOUNTER — Ambulatory Visit (INDEPENDENT_AMBULATORY_CARE_PROVIDER_SITE_OTHER): Payer: Medicaid Other

## 2019-05-12 VITALS — BP 126/82 | HR 98 | Wt 230.0 lb

## 2019-05-12 DIAGNOSIS — O30041 Twin pregnancy, dichorionic/diamniotic, first trimester: Secondary | ICD-10-CM

## 2019-05-12 DIAGNOSIS — Z1389 Encounter for screening for other disorder: Secondary | ICD-10-CM

## 2019-05-12 DIAGNOSIS — Z3A23 23 weeks gestation of pregnancy: Secondary | ICD-10-CM

## 2019-05-12 DIAGNOSIS — Z331 Pregnant state, incidental: Secondary | ICD-10-CM

## 2019-05-12 DIAGNOSIS — Z362 Encounter for other antenatal screening follow-up: Secondary | ICD-10-CM

## 2019-05-12 DIAGNOSIS — O09292 Supervision of pregnancy with other poor reproductive or obstetric history, second trimester: Secondary | ICD-10-CM

## 2019-05-12 DIAGNOSIS — O0992 Supervision of high risk pregnancy, unspecified, second trimester: Secondary | ICD-10-CM | POA: Diagnosis not present

## 2019-05-12 DIAGNOSIS — O30042 Twin pregnancy, dichorionic/diamniotic, second trimester: Secondary | ICD-10-CM

## 2019-05-12 DIAGNOSIS — O0990 Supervision of high risk pregnancy, unspecified, unspecified trimester: Secondary | ICD-10-CM

## 2019-05-12 LAB — POCT URINALYSIS DIPSTICK OB
Blood, UA: NEGATIVE
Glucose, UA: NEGATIVE
Leukocytes, UA: NEGATIVE
Nitrite, UA: NEGATIVE

## 2019-05-12 NOTE — Progress Notes (Signed)
   HIGH-RISK PREGNANCY VISIT Patient name: Crystal Hines MRN [id number]  Date of birth: 1995-07-01 Chief Complaint:   High Risk Gestation (U/S EFW/ pap, discuss uds)  History of Present Illness:   Crystal Hines is a 22 y.o. G81P1011 female at [redacted]w[redacted]d with an Estimated Date of Delivery: 09/02/19 being seen today for ongoing management of a high-risk pregnancy complicated by [name] twina, hx of GHTN.  Today she reports no complaints. Contractions: Not present.  .  Movement: Present. denies leaking of fluid.  Review of Systems:   Pertinent items are noted in HPI Denies abnormal vaginal discharge w/ itching/odor/irritation, headaches, visual changes, shortness of breath, chest pain, abdominal pain, severe nausea/vomiting, or problems with urination or bowel movements unless otherwise stated above. Pertinent History Reviewed:  Reviewed past medical,surgical, social, obstetrical and family history.  Reviewed problem list, medications and allergies. Physical Assessment:   Vitals:   05/12/19 1500  BP: 126/82  Pulse: 98  Weight: 230 lb (104.3 kg)  Body mass index is 34.97 kg/m.           Physical Examination:   General appearance: alert, well appearing, and in no distress  Mental status: alert, oriented to person, place, and time  Skin: warm & dry   Extremities: Edema: None    Cardiovascular: normal heart rate noted  Respiratory: normal respiratory effort, no distress  Abdomen: gravid, soft, non-tender  Pelvic: Cervical exam deferred         Fetal Status: Fetal Heart Rate (bpm): 157/152   Movement: Present    Fetal Surveillance Testing today: sonogram   Chaperone: n/a    No results found for this or any previous visit (from the past 24 hour(s)).  Assessment & Plan:  1) High-risk pregnancy G3P1011 at [redacted]w[redacted]d with an Estimated Date of Delivery: 09/02/19   2) [name] twins, stable, normal growth progressions for both babies  3) Hx of GHTN, stable  Meds: No orders of the defined types were  placed in this encounter.   Labs/procedures today:   Treatment Plan:  Routine care for twins, PN2  Reviewed: Preterm labor symptoms and general obstetric precautions including but not limited to vaginal bleeding, contractions, leaking of fluid and fetal movement were reviewed in detail with the patient.  All questions were answered. Has home bp cuff. Rx faxed to Has. Check bp weekly, let us know if >140/90.   Follow-up: Return in about 4 weeks (around 06/09/2019) for PN2, HROB, with Dr Elonda Husky.  Orders Placed This Encounter  Procedures  . POC Urinalysis Dipstick OB   Florian Buff  10/25/2019 10:05 PM

## 2019-05-12 NOTE — Progress Notes (Signed)
[place] DI/DI twins:23+6 wks,cx 2.9 cm,normal ovaries  BABY A: cephalic right,posterior placenta gr 0,svp of fluid 6.4 cm,EFW 599 g 26%,discordance 8% BABY B: cephalic left,fhr 419 bpm,anterior placenta gr 0,fhr 152 bpm,svp of fluid 7.2 cm,LVEICF n/c,efw 651 g 48%

## 2019-06-10 ENCOUNTER — Ambulatory Visit (INDEPENDENT_AMBULATORY_CARE_PROVIDER_SITE_OTHER): Payer: Medicaid Other | Admitting: Obstetrics and Gynecology

## 2019-06-10 ENCOUNTER — Other Ambulatory Visit: Payer: Self-pay

## 2019-06-10 ENCOUNTER — Encounter: Payer: Self-pay | Admitting: Obstetrics and Gynecology

## 2019-06-10 ENCOUNTER — Other Ambulatory Visit: Payer: Medicaid Other

## 2019-06-10 VITALS — BP 146/80 | HR 98 | Wt 235.6 lb

## 2019-06-10 DIAGNOSIS — Z131 Encounter for screening for diabetes mellitus: Secondary | ICD-10-CM

## 2019-06-10 DIAGNOSIS — Z3A19 19 weeks gestation of pregnancy: Secondary | ICD-10-CM | POA: Diagnosis not present

## 2019-06-10 DIAGNOSIS — Z331 Pregnant state, incidental: Secondary | ICD-10-CM | POA: Diagnosis not present

## 2019-06-10 DIAGNOSIS — Z3A28 28 weeks gestation of pregnancy: Secondary | ICD-10-CM

## 2019-06-10 DIAGNOSIS — O0993 Supervision of high risk pregnancy, unspecified, third trimester: Secondary | ICD-10-CM

## 2019-06-10 DIAGNOSIS — O0990 Supervision of high risk pregnancy, unspecified, unspecified trimester: Secondary | ICD-10-CM

## 2019-06-10 DIAGNOSIS — Z113 Encounter for screening for infections with a predominantly sexual mode of transmission: Secondary | ICD-10-CM | POA: Diagnosis not present

## 2019-06-10 DIAGNOSIS — Z1389 Encounter for screening for other disorder: Secondary | ICD-10-CM

## 2019-06-10 LAB — POCT URINALYSIS DIPSTICK OB
Blood, UA: NEGATIVE
Glucose, UA: NEGATIVE
Ketones, UA: NEGATIVE
Leukocytes, UA: NEGATIVE
Nitrite, UA: NEGATIVE
POC,PROTEIN,UA: NEGATIVE

## 2019-06-10 NOTE — Progress Notes (Signed)
Patient ID: Crystal Hines, female   DOB: 06-09-1996, 23 y.o.   MRN: [id number]    [facility] PREGNANCY VISIT Patient name: Crystal Hines MRN [id number]  Date of birth: 12/28/1995 Chief Complaint:   High Risk Gestation (PN2 today; not sleeping good)  History of Present Illness:   Crystal Hines is a 23 y.o. G41P1011 female at [redacted]w[redacted]d with an Estimated Date of Delivery: 09/02/19 being seen today for ongoing management of a high-risk pregnancy complicated by multiple gestation Di-Di Twins  Today she reports no complaints. This is her second pregnancy. She bottle fed her first child, but plans to breastfeed these two. She reports that she has not used any recreational drugs since August, acknowledges THC in [facility],, but her test was positive.for [facility] and Cocaine 11/2  Contractions: Not present. Vag. Bleeding: None.  Movement: Present. denies leaking of fluid.  Review of Systems:   Pertinent items are noted in HPI Denies abnormal vaginal discharge w/ itching/odor/irritation, headaches, visual changes, shortness of breath, chest pain, abdominal pain, severe nausea/vomiting, or problems with urination or bowel movements unless otherwise stated above. Pertinent History Reviewed:  Reviewed past medical,surgical, social, obstetrical and family history.  Reviewed problem list, medications and allergies. Physical Assessment:   Vitals:   06/10/19 0856  BP: (!) 146/80  Pulse: 98  Weight: 235 lb 9.6 oz (106.9 kg)  Body mass index is 35.82 kg/m.           Physical Examination:   General appearance: alert, well appearing, and in no distress and oriented to person, place, and time  Mental status: alert, oriented to person, place, and time, normal mood, behavior, speech, dress, motor activity, and thought processes, affect appropriate to mood  Skin: warm & dry   Extremities: Edema: Trace    Cardiovascular: normal heart rate noted  Respiratory: normal respiratory effort, no distress  Abdomen: gravid,  soft, non-tender  Pelvic: Cervical exam deferred         Fetal Status: Fetal Heart Rate (bpm): 152/143 Fundal Height: 34 cm Movement: Present    Fetal Surveillance Testing today: Portable US, A: vertex (HR: 152), B: breech (HR: 143)  No results found for this or any previous visit (from the past 24 hour(s)).  Assessment & Plan:  1) High-risk pregnancy G3P1011 at [redacted]w[redacted]d with an Estimated Date of Delivery: 09/02/19   2) multiple gestation Di-Di Twins, stable  3) H/o GHTN, pt checks her BP at home, currently taking ASA 162 mg daily 4)  1st tri UDS +, will recheck x 2.  Meds: No orders of the defined types were placed in this encounter.  Labs/procedures today: PN2, UDS  Treatment Plan:  U/S q4wks    2x/wk testing @ 36wks or weekly BPP    Deliver @ 38wks  Follow-up: Return in about 1 week (around 06/17/2019) for HROB, U/S: BPP.  Orders Placed This Encounter  Procedures  . POC Urinalysis Dipstick OB   By signing my name below, I, De Burrs, attest that this documentation has been prepared under the direction and in the presence of Jonnie Kind, MD. Electronically Signed: De Burrs, Medical Scribe. 06/10/19. 9:26 AM.  I personally performed the services described in this documentation, which was SCRIBED in my presence. The recorded information has been reviewed and considered accurate. It has been edited as necessary during review. Jonnie Kind, MD

## 2019-06-11 ENCOUNTER — Other Ambulatory Visit: Payer: Self-pay | Admitting: Obstetrics and Gynecology

## 2019-06-11 LAB — CBC
Hematocrit: 34.4 % (ref 34.0–46.6)
Hemoglobin: 10.9 g/dL — ABNORMAL LOW (ref 11.1–15.9)
MCH: 26.4 pg — ABNORMAL LOW (ref 26.6–33.0)
MCHC: 31.7 g/dL (ref 31.5–35.7)
MCV: 83 fL (ref 79–97)
Platelets: 265 10*3/uL (ref 150–450)
RBC: 4.13 x10E6/uL (ref 3.77–5.28)
RDW: 13.3 % (ref 11.7–15.4)
WBC: 7.9 10*3/uL (ref 3.4–10.8)

## 2019-06-11 LAB — GC/CHLAMYDIA PROBE AMP
Chlamydia trachomatis, NAA: NEGATIVE
Neisseria Gonorrhoeae by PCR: NEGATIVE

## 2019-06-11 LAB — GLUCOSE TOLERANCE, 2 HOURS W/ 1HR
Glucose, 1 hour: 122 mg/dL (ref 65–179)
Glucose, 2 hour: 100 mg/dL (ref 65–152)
Glucose, Fasting: 76 mg/dL (ref 65–91)

## 2019-06-11 LAB — RPR: RPR Ser Ql: NONREACTIVE

## 2019-06-11 LAB — HIV ANTIBODY (ROUTINE TESTING W REFLEX): HIV Screen 4th Generation wRfx: NONREACTIVE

## 2019-06-11 LAB — ANTIBODY SCREEN: Antibody Screen: NEGATIVE

## 2019-06-11 MED ORDER — FERROUS SULFATE 325 (65 FE) MG PO TABS: 325.0000 mg | ORAL_TABLET | Freq: Two times a day (BID) | ORAL | 1 refills | Status: DC

## 2019-06-11 NOTE — Progress Notes (Signed)
Mild anemia, needs to take her vitamins, and to add iron tablets, ferrous sulfate twice daily

## 2019-06-12 NOTE — L&D Delivery Note (Signed)
  Crystal Hines, Crystal Hines [[id number]]  Delivery Note At 4:10 AM a viable female was delivered via Vaginal, Spontaneous (Presentation: LOA).  APGAR: 8, 9; weight: PENDING.   Placenta status: Spontaneous; Pathology, Intact.  Cord: 3 vessels with the following complications: Short.  Cord pH: N/A  Anesthesia:  Epidural Episiotomy: None Lacerations: bilateral Periurethral, hemostatic Suture Repair: None Est. Blood Loss (mL): 69mL  Mom to postpartum.  Baby to Couplet care / Skin to Skin.  Crystal Hines 08/06/2019, 4:37 AM     Crystal Hines [[id number]]  Delivery Note At 4:19 AM a viable female was delivered via Vaginal, Spontaneous (Presentation: LOA).  APGAR: 8, 9; weight: PENDING  .   Placenta status: Pathology;Spontaneous, Intact.  Cord: 3 vessels with the following complications: None.  Cord pH: N/A  Anesthesia: Epidural Episiotomy: None Lacerations: Bilateral Periurethral, hemostatic Suture Repair: None Est. Blood Loss (mL): 50 mL  Mom to postpartum.  Baby to Couplet care / Skin to Skin.  Crystal Hines 08/06/2019, 4:37 AM

## 2019-06-18 ENCOUNTER — Other Ambulatory Visit: Payer: Self-pay | Admitting: Obstetrics and Gynecology

## 2019-06-18 DIAGNOSIS — O30009 Twin pregnancy, unspecified number of placenta and unspecified number of amniotic sacs, unspecified trimester: Secondary | ICD-10-CM

## 2019-06-19 ENCOUNTER — Ambulatory Visit (INDEPENDENT_AMBULATORY_CARE_PROVIDER_SITE_OTHER): Payer: Medicaid Other

## 2019-06-19 ENCOUNTER — Other Ambulatory Visit: Payer: Self-pay

## 2019-06-19 ENCOUNTER — Other Ambulatory Visit: Payer: Self-pay | Admitting: Obstetrics and Gynecology

## 2019-06-19 ENCOUNTER — Encounter: Payer: Medicaid Other | Admitting: Obstetrics & Gynecology

## 2019-06-19 DIAGNOSIS — O365923 Maternal care for other known or suspected poor fetal growth, second trimester, fetus 3: Secondary | ICD-10-CM

## 2019-06-19 DIAGNOSIS — Z3A29 29 weeks gestation of pregnancy: Secondary | ICD-10-CM

## 2019-06-19 DIAGNOSIS — O365929 Maternal care for other known or suspected poor fetal growth, second trimester, other fetus: Secondary | ICD-10-CM

## 2019-06-19 DIAGNOSIS — O30043 Twin pregnancy, dichorionic/diamniotic, third trimester: Secondary | ICD-10-CM | POA: Diagnosis not present

## 2019-06-19 DIAGNOSIS — Z362 Encounter for other antenatal screening follow-up: Secondary | ICD-10-CM

## 2019-06-19 DIAGNOSIS — O30009 Twin pregnancy, unspecified number of placenta and unspecified number of amniotic sacs, unspecified trimester: Secondary | ICD-10-CM

## 2019-06-19 NOTE — Progress Notes (Signed)
[place] 29+2 wks,DI/DI twins,cx 3.8 cm,normal ovaries, BABY A: cephalic,posterior placenta gr 0,svp of fluid 4.5 cm,fhr 142 bpm,RI .70,.67,.67,.68,=63%,EFW 1133 g 5.8%,discordance 14% BABY B: breech,anterior placenta gr 0,svp of fluid 5.1 cm,fhr 132 bpm,RI .72,.81,.59,.67=68%,EFW 1320 g 28%

## 2019-06-23 ENCOUNTER — Other Ambulatory Visit: Payer: Self-pay

## 2019-06-23 ENCOUNTER — Ambulatory Visit (INDEPENDENT_AMBULATORY_CARE_PROVIDER_SITE_OTHER): Payer: Medicaid Other | Admitting: Obstetrics & Gynecology

## 2019-06-23 ENCOUNTER — Encounter: Payer: Self-pay | Admitting: Obstetrics & Gynecology

## 2019-06-23 VITALS — BP 130/65 | HR 96 | Wt 236.0 lb

## 2019-06-23 DIAGNOSIS — Z1389 Encounter for screening for other disorder: Secondary | ICD-10-CM

## 2019-06-23 DIAGNOSIS — Z331 Pregnant state, incidental: Secondary | ICD-10-CM

## 2019-06-23 DIAGNOSIS — O30042 Twin pregnancy, dichorionic/diamniotic, second trimester: Secondary | ICD-10-CM

## 2019-06-23 DIAGNOSIS — Z87898 Personal history of other specified conditions: Secondary | ICD-10-CM

## 2019-06-23 DIAGNOSIS — O0990 Supervision of high risk pregnancy, unspecified, unspecified trimester: Secondary | ICD-10-CM | POA: Diagnosis not present

## 2019-06-23 DIAGNOSIS — F1991 Other psychoactive substance use, unspecified, in remission: Secondary | ICD-10-CM

## 2019-06-23 LAB — POCT URINALYSIS DIPSTICK OB
Blood, UA: NEGATIVE
Glucose, UA: NEGATIVE
Ketones, UA: NEGATIVE
Nitrite, UA: NEGATIVE

## 2019-06-23 NOTE — Progress Notes (Signed)
   HIGH-RISK PREGNANCY VISIT Patient name: Crystal Hines MRN [id number]  Date of birth: 1995/11/10 Chief Complaint:   High Risk Gestation  History of Present Illness:   Crystal Hines is a 24 y.o. G42P1011 female at [redacted]w[redacted]d with an Estimated Date of Delivery: 09/02/19 being seen today for ongoing management of a high-risk pregnancy complicated by [name] twins.  Today she reports no complaints. Contractions: Not present. Vag. Bleeding: None.  Movement: Present. denies leaking of fluid.  Review of Systems:   Pertinent items are noted in HPI Denies abnormal vaginal discharge w/ itching/odor/irritation, headaches, visual changes, shortness of breath, chest pain, abdominal pain, severe nausea/vomiting, or problems with urination or bowel movements unless otherwise stated above. Pertinent History Reviewed:  Reviewed past medical,surgical, social, obstetrical and family history.  Reviewed problem list, medications and allergies. Physical Assessment:   Vitals:   06/23/19 1030  BP: 130/65  Pulse: 96  Weight: 236 lb (107 kg)  Body mass index is 35.88 kg/m.           Physical Examination:   General appearance: alert, well appearing, and in no distress  Mental status: alert, oriented to person, place, and time  Skin: warm & Hines   Extremities: Edema: Trace    Cardiovascular: normal heart rate noted  Respiratory: normal respiratory effort, no distress  Abdomen: gravid, soft, non-tender  Pelvic: Cervical exam deferred         Fetal Status: Fetal Heart Rate (bpm): 135/142 Fundal Height: 40 cm Movement: Present    Fetal Surveillance Testing today:    Chaperone: n/a    No results found for this or any previous visit (from the past 24 hour(s)).  Assessment & Plan:  1) High-risk pregnancy G3P1011 at [redacted]w[redacted]d with an Estimated Date of Delivery: 09/02/19   2) [name] twins, stable    Meds: No orders of the defined types were placed in this encounter.   Labs/procedures today:   Treatment Plan:   Routine twin management  Reviewed: Preterm labor symptoms and general obstetric precautions including but not limited to vaginal bleeding, contractions, leaking of fluid and fetal movement were reviewed in detail with the patient.  All questions were answered. Has home bp cuff. Rx faxed to . Check bp weekly, let us know if >140/90.   Follow-up: Return in about 2 weeks (around 07/07/2019) for BPP/sono, Twins, HROB, with Dr Despina Hidden.  Orders Placed This Encounter  Procedures  . US Fetal BPP W/O Non Stress  . [place] UA Cord Doppler  . POC Urinalysis Dipstick OB   Amaryllis Dyke Janda Cargo  06/23/2019 11:03 AM

## 2019-06-24 LAB — PMP SCREEN PROFILE (10S), URINE
Amphetamine Scrn, Ur: NEGATIVE ng/mL
BARBITURATE SCREEN URINE: NEGATIVE ng/mL
BENZODIAZEPINE SCREEN, URINE: NEGATIVE ng/mL
CANNABINOIDS UR QL SCN: POSITIVE ng/mL — AB
Cocaine (Metab) Scrn, Ur: NEGATIVE ng/mL
Creatinine(Crt), U: 193 mg/dL (ref 20.0–300.0)
Methadone Screen, Urine: NEGATIVE ng/mL
OXYCODONE+OXYMORPHONE UR QL SCN: NEGATIVE ng/mL
Opiate Scrn, Ur: NEGATIVE ng/mL
Ph of Urine: 6.3 (ref 4.5–8.9)
Phencyclidine Qn, Ur: NEGATIVE ng/mL
Propoxyphene Scrn, Ur: NEGATIVE ng/mL

## 2019-06-25 ENCOUNTER — Other Ambulatory Visit: Payer: Medicaid Other

## 2019-06-29 ENCOUNTER — Other Ambulatory Visit: Payer: Self-pay | Admitting: Obstetrics & Gynecology

## 2019-06-29 DIAGNOSIS — O365921 Maternal care for other known or suspected poor fetal growth, second trimester, fetus 1: Secondary | ICD-10-CM

## 2019-06-29 DIAGNOSIS — O30042 Twin pregnancy, dichorionic/diamniotic, second trimester: Secondary | ICD-10-CM

## 2019-07-03 ENCOUNTER — Other Ambulatory Visit: Payer: Medicaid Other

## 2019-07-03 ENCOUNTER — Encounter: Payer: Medicaid Other | Admitting: Obstetrics & Gynecology

## 2019-07-06 ENCOUNTER — Other Ambulatory Visit: Payer: Self-pay | Admitting: Obstetrics & Gynecology

## 2019-07-06 DIAGNOSIS — IMO0002 Reserved for concepts with insufficient information to code with codable children: Secondary | ICD-10-CM

## 2019-07-06 DIAGNOSIS — O30042 Twin pregnancy, dichorionic/diamniotic, second trimester: Secondary | ICD-10-CM

## 2019-07-07 ENCOUNTER — Other Ambulatory Visit: Payer: Self-pay

## 2019-07-07 ENCOUNTER — Ambulatory Visit (INDEPENDENT_AMBULATORY_CARE_PROVIDER_SITE_OTHER): Payer: Medicaid Other

## 2019-07-07 ENCOUNTER — Ambulatory Visit (INDEPENDENT_AMBULATORY_CARE_PROVIDER_SITE_OTHER): Payer: Medicaid Other | Admitting: Obstetrics and Gynecology

## 2019-07-07 VITALS — BP 142/87 | HR 91 | Wt 234.8 lb

## 2019-07-07 DIAGNOSIS — IMO0002 Reserved for concepts with insufficient information to code with codable children: Secondary | ICD-10-CM

## 2019-07-07 DIAGNOSIS — O365921 Maternal care for other known or suspected poor fetal growth, second trimester, fetus 1: Secondary | ICD-10-CM | POA: Diagnosis not present

## 2019-07-07 DIAGNOSIS — O30042 Twin pregnancy, dichorionic/diamniotic, second trimester: Secondary | ICD-10-CM

## 2019-07-07 DIAGNOSIS — O30041 Twin pregnancy, dichorionic/diamniotic, first trimester: Secondary | ICD-10-CM

## 2019-07-07 DIAGNOSIS — Z3483 Encounter for supervision of other normal pregnancy, third trimester: Secondary | ICD-10-CM

## 2019-07-07 DIAGNOSIS — O0990 Supervision of high risk pregnancy, unspecified, unspecified trimester: Secondary | ICD-10-CM

## 2019-07-07 DIAGNOSIS — Z3A31 31 weeks gestation of pregnancy: Secondary | ICD-10-CM

## 2019-07-07 DIAGNOSIS — O30043 Twin pregnancy, dichorionic/diamniotic, third trimester: Secondary | ICD-10-CM

## 2019-07-07 DIAGNOSIS — Z1389 Encounter for screening for other disorder: Secondary | ICD-10-CM

## 2019-07-07 DIAGNOSIS — Z331 Pregnant state, incidental: Secondary | ICD-10-CM

## 2019-07-07 NOTE — Progress Notes (Signed)
[place] 31+6 wks,DI/DI twins BABY A:cephalic right,posterior placenta gr 0,fhr 140 bpm,SVP of fluid 7.4 cm,RI .76,.70,.63,.68,.72=83%,S/D 3.27=79%,BPP 8/8 BABY B: cephalic left superior,anterior placenta gr 0,svp of fluid 4.9 cm,fhr 126 bpm,RI.63,.76,.84,.76,.80=97%,S/D 4.38=99%elevated UAD with EDF,BPP 8/8

## 2019-07-07 NOTE — Progress Notes (Addendum)
Patient ID: Crystal Hines, female   DOB: [birth year], 24 y.o.   MRN: [id number]    [facility] PREGNANCY VISIT Patient name: Crystal Hines MRN [id number]  Date of birth: 05-29-1996 Chief Complaint:   Routine Prenatal Visit  History of Present Illness:   Crystal Hines is a 24 y.o. G33P1011 female at [redacted]w[redacted]d with an Estimated Date of Delivery: 09/02/19 being seen today for ongoing management of a high-risk pregnancy complicated by multiple gestation [name].  Today she reports no complaints. Contractions: Not present. Vag. Bleeding: None.  Movement: Present. denies leaking of fluid.  Review of Systems:   Pertinent items are noted in HPI Denies abnormal vaginal discharge w/ itching/odor/irritation, headaches, visual changes, shortness of breath, chest pain, abdominal pain, severe nausea/vomiting, or problems with urination or bowel movements unless otherwise stated above. Pertinent History Reviewed:  Reviewed past medical,surgical, social, obstetrical and family history.  Reviewed problem list, medications and allergies. Physical Assessment:   Vitals:   07/07/19 1554  BP: (!) 142/87  Pulse: 91  Weight: 234 lb 12.8 oz (106.5 kg)  Body mass index is 35.7 kg/m.           Physical Examination:   General appearance: alert, well appearing, and in no distress  Mental status: alert, oriented to person, place, and time, normal mood, behavior, speech, dress, motor activity, and thought processes, affect appropriate to mood  Skin: warm & dry   Extremities: Edema: None    Cardiovascular: normal heart rate noted  Respiratory: normal respiratory effort, no distress  Abdomen: gravid, soft, non-tender  Pelvic: Cervical exam deferred         Fetal Status:     Movement: Present    Fetal Surveillance Testing today: [place] 31+6 wks,DI/DI twins BABY A:cephalic right,posterior placenta gr 0,fhr 140 bpm,SVP of fluid 7.4 cm,RI .76,.70,.63,.68,.72=83%,S/D 3.27=79%,BPP 8/8  BABY B: cephalic left  superior,anterior placenta gr 0,svp of fluid 4.9 cm,fhr 126 bpm,RI.63,.76,.84,.76,.80=97%,S/D 4.38=99%elevated UAD with EDF,BPP 8/8   Chaperone: [employer]    No results found for this or any previous visit (from the past 24 hour(s)).  Assessment & Plan:  1) High-risk pregnancy G3P1011 at [redacted]w[redacted]d with an Estimated Date of Delivery: 09/02/19   2) [name], stable  Meds: No orders of the defined types were placed in this encounter.   Labs/procedures today: BPP  Treatment Plan:  3 days NST  Reviewed: Preterm labor symptoms and general obstetric precautions including but not limited to vaginal bleeding, contractions, leaking of fluid and fetal movement were reviewed in detail with the patient.  All questions were answered. Check bp weekly, let us know if >140/90.   Follow-up: Return in about 3 days (around 07/10/2019) for NST.  Orders Placed This Encounter  Procedures  . POC Urinalysis Dipstick OB   By signing my name below, I, Arnette Norris, attest that this documentation has been prepared under the direction and in the presence of Tilda Burrow, MD. Electronically Signed: Arnette Norris Medical Scribe. 07/07/19. 4:25 PM.  I personally performed the services described in this documentation, which was SCRIBED in my presence. The recorded information has been reviewed and considered accurate. It has been edited as necessary during review. Tilda Burrow, MD

## 2019-07-10 ENCOUNTER — Encounter: Payer: Self-pay | Admitting: *Deleted

## 2019-07-10 ENCOUNTER — Other Ambulatory Visit: Payer: Medicaid Other

## 2019-07-10 ENCOUNTER — Other Ambulatory Visit: Payer: Self-pay

## 2019-07-10 ENCOUNTER — Ambulatory Visit (INDEPENDENT_AMBULATORY_CARE_PROVIDER_SITE_OTHER): Payer: Medicaid Other | Admitting: *Deleted

## 2019-07-10 ENCOUNTER — Encounter: Payer: Medicaid Other | Admitting: Obstetrics and Gynecology

## 2019-07-10 VITALS — BP 134/82 | HR 83 | Wt 235.0 lb

## 2019-07-10 DIAGNOSIS — O0990 Supervision of high risk pregnancy, unspecified, unspecified trimester: Secondary | ICD-10-CM

## 2019-07-10 DIAGNOSIS — O365931 Maternal care for other known or suspected poor fetal growth, third trimester, fetus 1: Secondary | ICD-10-CM | POA: Diagnosis not present

## 2019-07-10 DIAGNOSIS — O30043 Twin pregnancy, dichorionic/diamniotic, third trimester: Secondary | ICD-10-CM

## 2019-07-10 DIAGNOSIS — Z3A32 32 weeks gestation of pregnancy: Secondary | ICD-10-CM

## 2019-07-10 DIAGNOSIS — O30041 Twin pregnancy, dichorionic/diamniotic, first trimester: Secondary | ICD-10-CM

## 2019-07-10 DIAGNOSIS — O365932 Maternal care for other known or suspected poor fetal growth, third trimester, fetus 2: Secondary | ICD-10-CM

## 2019-07-10 DIAGNOSIS — Z1389 Encounter for screening for other disorder: Secondary | ICD-10-CM

## 2019-07-10 DIAGNOSIS — Z331 Pregnant state, incidental: Secondary | ICD-10-CM

## 2019-07-10 LAB — POCT URINALYSIS DIPSTICK OB
Blood, UA: NEGATIVE
Glucose, UA: NEGATIVE
Ketones, UA: NEGATIVE
Leukocytes, UA: NEGATIVE
Nitrite, UA: NEGATIVE
POC,PROTEIN,UA: NEGATIVE

## 2019-07-10 NOTE — Progress Notes (Signed)
   NURSE VISIT- NST  SUBJECTIVE:  Crystal Hines is a 24 y.o. G43P1011 female at [redacted]w[redacted]d, here for a NST for pregnancy complicated by FGR and Multiple gestation.  She reports active fetal movement, contractions: none, vaginal bleeding: none, membranes: intact.   OBJECTIVE:  BP 134/82   Pulse 83   Wt 235 lb (106.6 kg)   LMP 11/26/2018   BMI 35.73 kg/m   Appears well, no apparent distress  Results for orders placed or performed in visit on 07/10/19 (from the past 24 hour(s))  POC Urinalysis Dipstick OB   Collection Time: 07/10/19 11:53 AM  Result Value Ref Range   Color, UA     Clarity, UA     Glucose, UA Negative Negative   Bilirubin, UA     Ketones, UA neg    Spec Grav, UA     Blood, UA neg    pH, UA     POC,PROTEIN,UA Negative Negative, Trace, Small (1+), Moderate (2+), Large (3+), 4+   Urobilinogen, UA     Nitrite, UA neg    Leukocytes, UA Negative Negative   Appearance     Odor      NST: FHR A baseline 135 bpm, Variability: moderate, Accelerations:present, Decelerations:  Absent= Cat 1 /Reactive FHR B baseline 125, Variability:moderate, Accels:present, decels:absent, Cat 1/Reactive Toco: none   ASSESSMENT: G3P1011 at [redacted]w[redacted]d with FGR and Multiple gestation NST reactive  PLAN: EFM strip reviewed by Dr. Alysia Penna   Recommendations: keep next appointment as scheduled    Jobe Marker  07/10/2019 12:46 PM

## 2019-07-13 ENCOUNTER — Other Ambulatory Visit: Payer: Self-pay | Admitting: Obstetrics and Gynecology

## 2019-07-13 DIAGNOSIS — IMO0002 Reserved for concepts with insufficient information to code with codable children: Secondary | ICD-10-CM

## 2019-07-13 DIAGNOSIS — O30043 Twin pregnancy, dichorionic/diamniotic, third trimester: Secondary | ICD-10-CM

## 2019-07-13 DIAGNOSIS — O133 Gestational [pregnancy-induced] hypertension without significant proteinuria, third trimester: Secondary | ICD-10-CM

## 2019-07-14 ENCOUNTER — Ambulatory Visit (INDEPENDENT_AMBULATORY_CARE_PROVIDER_SITE_OTHER): Payer: Medicaid Other

## 2019-07-14 ENCOUNTER — Other Ambulatory Visit: Payer: Self-pay

## 2019-07-14 DIAGNOSIS — Z3A32 32 weeks gestation of pregnancy: Secondary | ICD-10-CM | POA: Diagnosis not present

## 2019-07-14 DIAGNOSIS — O365931 Maternal care for other known or suspected poor fetal growth, third trimester, fetus 1: Secondary | ICD-10-CM

## 2019-07-14 DIAGNOSIS — O30041 Twin pregnancy, dichorionic/diamniotic, first trimester: Secondary | ICD-10-CM

## 2019-07-14 DIAGNOSIS — Z362 Encounter for other antenatal screening follow-up: Secondary | ICD-10-CM

## 2019-07-14 DIAGNOSIS — O133 Gestational [pregnancy-induced] hypertension without significant proteinuria, third trimester: Secondary | ICD-10-CM | POA: Diagnosis not present

## 2019-07-14 DIAGNOSIS — O30043 Twin pregnancy, dichorionic/diamniotic, third trimester: Secondary | ICD-10-CM

## 2019-07-14 DIAGNOSIS — IMO0002 Reserved for concepts with insufficient information to code with codable children: Secondary | ICD-10-CM

## 2019-07-14 DIAGNOSIS — O0990 Supervision of high risk pregnancy, unspecified, unspecified trimester: Secondary | ICD-10-CM

## 2019-07-14 NOTE — Progress Notes (Signed)
[place] 32+6 wks,DI/DI twins,cx 3 cm,normal ovaries  BABY A: cephalic right,posterior placenta gr 0,svp of fluid 3.8 cm,fhr 137 bpm,RI .69,.61,.62,.62=57%,EFW 1669 g 5.1% BABY B: cephalic left superior,anterior placenta gr 0,svp of fluid 6 cm,fht 144 bpm,RI .75,.62,.70,84%,EFW 2000 g 32%

## 2019-07-17 ENCOUNTER — Other Ambulatory Visit: Payer: Medicaid Other

## 2019-07-17 ENCOUNTER — Ambulatory Visit (INDEPENDENT_AMBULATORY_CARE_PROVIDER_SITE_OTHER): Payer: Medicaid Other | Admitting: Obstetrics & Gynecology

## 2019-07-17 ENCOUNTER — Other Ambulatory Visit: Payer: Self-pay

## 2019-07-17 ENCOUNTER — Encounter: Payer: Self-pay | Admitting: Obstetrics & Gynecology

## 2019-07-17 ENCOUNTER — Encounter: Payer: Medicaid Other | Admitting: Obstetrics & Gynecology

## 2019-07-17 DIAGNOSIS — O30041 Twin pregnancy, dichorionic/diamniotic, first trimester: Secondary | ICD-10-CM

## 2019-07-17 DIAGNOSIS — O0993 Supervision of high risk pregnancy, unspecified, third trimester: Secondary | ICD-10-CM

## 2019-07-17 DIAGNOSIS — Z3A33 33 weeks gestation of pregnancy: Secondary | ICD-10-CM

## 2019-07-17 DIAGNOSIS — O0990 Supervision of high risk pregnancy, unspecified, unspecified trimester: Secondary | ICD-10-CM

## 2019-07-17 DIAGNOSIS — O30043 Twin pregnancy, dichorionic/diamniotic, third trimester: Secondary | ICD-10-CM | POA: Diagnosis not present

## 2019-07-17 NOTE — Progress Notes (Signed)
   HIGH-RISK PREGNANCY VISIT Patient name: Crystal Hines MRN [id number]  Date of birth: [birth year] Chief Complaint:   High Risk Gestation (NST Room # 2)  History of Present Illness:   Crystal Hines is a 24 y.o. G18P1011 female at [redacted]w[redacted]d with an Estimated Date of Delivery: 09/02/19 being seen today for ongoing management of a high-risk pregnancy complicated by [name] tiwns with twin A FGR and 16% smaller than twin B, both with good Dopplers.  Today she reports no complaints. Contractions: Not present.  .  Movement: Present. denies leaking of fluid.  Review of Systems:   Pertinent items are noted in HPI Denies abnormal vaginal discharge w/ itching/odor/irritation, headaches, visual changes, shortness of breath, chest pain, abdominal pain, severe nausea/vomiting, or problems with urination or bowel movements unless otherwise stated above. Pertinent History Reviewed:  Reviewed past medical,surgical, social, obstetrical and family history.  Reviewed problem list, medications and allergies. Physical Assessment:   Vitals:   07/17/19 1117  BP: (!) 146/90  Pulse: 80  Weight: 238 lb 6.4 oz (108.1 kg)  Body mass index is 36.25 kg/m.           Physical Examination:   General appearance: alert, well appearing, and in no distress  Mental status: alert, oriented to person, place, and time  Skin: warm & dry   Extremities: Edema: None    Cardiovascular: normal heart rate noted  Respiratory: normal respiratory effort, no distress  Abdomen: gravid, soft, non-tender  Pelvic: Cervical exam deferred         Fetal Status: Fetal Heart Rate (bpm): 140/145   Movement: Present    Fetal Surveillance Testing today: Reactive NST x 2 for both twins  Crystal Hines is at [redacted]w[redacted]d Estimated Date of Delivery: 09/02/19  NST being performed due to [facility] twins with twin A FGR  Today the NST is Reactive x 2  Fetal Monitoring:  Baseline: 140/145 bpm, Variability: Good {> 6 bpm), Accelerations: Reactive and  Decelerations: Absent   Reactive  No deceleration  The accelerations are >15 bpm and more than 2 in 20 minutes  Final diagnosis:  Reactive NST  Lazaro Arms, MD      Chaperone: n/a    No results found for this or any previous visit (from the past 24 hour(s)).  Assessment & Plan:  1) High-risk pregnancy G3P1011 at [redacted]w[redacted]d with an Estimated Date of Delivery: 09/02/19   2) [name] twins with twin A FGR 5%, 16% smaller than B, stable  3) Hx of cocaine abuse, none recent pt states  Meds: No orders of the defined types were placed in this encounter.   Labs/procedures today: reactive NST x 2  Treatment Plan:  Twice weekly surveillance IOL 36 weeks due to FGR or as clinically indicated  Reviewed: Preterm labor symptoms and general obstetric precautions including but not limited to vaginal bleeding, contractions, leaking of fluid and fetal movement were reviewed in detail with the patient.  All questions were answered. Has home bp cuff. Rx faxed to . Check bp weekly, let us know if >140/90.   Follow-up: Return in about 4 days (around 07/21/2019) for BPP/sono, HROB twins.  No orders of the defined types were placed in this encounter.  Lazaro Arms  07/17/2019 12:04 PM

## 2019-07-20 ENCOUNTER — Other Ambulatory Visit: Payer: Self-pay | Admitting: Obstetrics & Gynecology

## 2019-07-20 DIAGNOSIS — IMO0002 Reserved for concepts with insufficient information to code with codable children: Secondary | ICD-10-CM

## 2019-07-20 DIAGNOSIS — O30043 Twin pregnancy, dichorionic/diamniotic, third trimester: Secondary | ICD-10-CM

## 2019-07-21 ENCOUNTER — Encounter: Payer: Self-pay | Admitting: Obstetrics and Gynecology

## 2019-07-21 ENCOUNTER — Other Ambulatory Visit: Payer: Self-pay

## 2019-07-21 ENCOUNTER — Ambulatory Visit (INDEPENDENT_AMBULATORY_CARE_PROVIDER_SITE_OTHER): Payer: Medicaid Other | Admitting: Obstetrics and Gynecology

## 2019-07-21 ENCOUNTER — Ambulatory Visit (INDEPENDENT_AMBULATORY_CARE_PROVIDER_SITE_OTHER): Payer: Medicaid Other

## 2019-07-21 VITALS — BP 138/82 | HR 116 | Wt 240.6 lb

## 2019-07-21 DIAGNOSIS — O30043 Twin pregnancy, dichorionic/diamniotic, third trimester: Secondary | ICD-10-CM | POA: Diagnosis not present

## 2019-07-21 DIAGNOSIS — Z331 Pregnant state, incidental: Secondary | ICD-10-CM

## 2019-07-21 DIAGNOSIS — O0990 Supervision of high risk pregnancy, unspecified, unspecified trimester: Secondary | ICD-10-CM

## 2019-07-21 DIAGNOSIS — O0993 Supervision of high risk pregnancy, unspecified, third trimester: Secondary | ICD-10-CM

## 2019-07-21 DIAGNOSIS — IMO0002 Reserved for concepts with insufficient information to code with codable children: Secondary | ICD-10-CM

## 2019-07-21 DIAGNOSIS — Z1389 Encounter for screening for other disorder: Secondary | ICD-10-CM

## 2019-07-21 DIAGNOSIS — Z5320 Procedure and treatment not carried out because of patient's decision for unspecified reasons: Secondary | ICD-10-CM

## 2019-07-21 DIAGNOSIS — Z3A33 33 weeks gestation of pregnancy: Secondary | ICD-10-CM

## 2019-07-21 DIAGNOSIS — O30041 Twin pregnancy, dichorionic/diamniotic, first trimester: Secondary | ICD-10-CM

## 2019-07-21 DIAGNOSIS — O365932 Maternal care for other known or suspected poor fetal growth, third trimester, fetus 2: Secondary | ICD-10-CM | POA: Diagnosis not present

## 2019-07-21 LAB — POCT URINALYSIS DIPSTICK OB
Blood, UA: NEGATIVE
Glucose, UA: NEGATIVE
Ketones, UA: NEGATIVE
Leukocytes, UA: NEGATIVE
Nitrite, UA: NEGATIVE

## 2019-07-21 NOTE — Progress Notes (Signed)
[place] DI/DI TWINS BABY A:cephalic right,fhr 132 bpm,posterior placenta gr 1,svp of fluid 8.4 cm,RI .58,.57,.55,.60=55%,BPP 8/8 BABY B:transverse head left,fhr 137 bpm,anterior placenta gr 1,SVP 6 cm,RI .72,.73,.71,.72=95% with EDF,BPP 8/8

## 2019-07-21 NOTE — Progress Notes (Signed)
Patient ID: [name], female   DOB: 10/12/1995, 24 y.o.   MRN: [id number]    [facility] PREGNANCY VISIT Patient name: Crystal Hines MRN [id number]  Date of birth: 04/22/1996 Chief Complaint:   High Risk Gestation (U/S today; + contractions yesterday)  History of Present Illness:   Crystal Hines is a 24 y.o. G85P1011 female at [redacted]w[redacted]d with an Estimated Date of Delivery: 09/02/19 being seen today for ongoing management of a high-risk pregnancy complicated by multiple gestation [name] twins. FGR of B. She has signed tubal paper and would like tubal in the hospital.  Today she reports no complaints. Contractions: Irregular. Vag. Bleeding: None.   . denies leaking of fluid.  Review of Systems:   Pertinent items are noted in HPI Denies abnormal vaginal discharge w/ itching/odor/irritation, headaches, visual changes, shortness of breath, chest pain, abdominal pain, severe nausea/vomiting, or problems with urination or bowel movements unless otherwise stated above. Pertinent History Reviewed:  Reviewed past medical,surgical, social, obstetrical and family history.  Reviewed problem list, medications and allergies. Physical Assessment:   Vitals:   07/21/19 1618  BP: 138/82  Pulse: (!) 116  Weight: 240 lb 9.6 oz (109.1 kg)  Body mass index is 36.58 kg/m.           Physical Examination:   General appearance: alert, well appearing, and in no distress  Mental status: normal mood, behavior, speech, dress, motor activity, and thought processes, affect appropriate to mood  Skin: warm & dry   Extremities: Edema: None    Cardiovascular: normal heart rate noted  Respiratory: normal respiratory effort, no distress  Abdomen: gravid, soft, non-tender  Pelvic: Cervical exam deferred         Fetal Status:          Fetal Surveillance Testing today: [place] DI/DI TWINS BABY A:cephalic right,fhr 254 bpm,posterior placenta gr 1,svp of fluid 8.4 cm,RI .58,.57,.55,.60=55%,BPP 8/8 BABY B:transverse head  left,fhr 137 bpm,anterior placenta gr 1,SVP 6 cm,RI .72,.73,.71,.72=95% with EDF,BPP 8/8   Chaperone: Samul Dada    Results for orders placed or performed in visit on 07/21/19 (from the past 24 hour(s))  POC Urinalysis Dipstick OB   Collection Time: 07/21/19  4:19 PM  Result Value Ref Range   Color, UA     Clarity, UA     Glucose, UA Negative Negative   Bilirubin, UA     Ketones, UA neg    Spec Grav, UA     Blood, UA neg    pH, UA     POC,PROTEIN,UA Trace Negative, Trace, Small (1+), Moderate (2+), Large (3+), 4+   Urobilinogen, UA     Nitrite, UA neg    Leukocytes, UA Negative Negative   Appearance     Odor      Assessment & Plan:  1) High-risk pregnancy G3P1011 at [redacted]w[redacted]d with an Estimated Date of Delivery: 09/02/19   2) [name], stable  Meds: No orders of the defined types were placed in this encounter.   Labs/procedures today: [place] DI/DI TWINS BABY A:cephalic right,fhr 270 bpm,posterior placenta gr 1,svp of fluid 8.4 cm,RI .58,.57,.55,.60=55%,BPP 8/8 BABY B:transverse head left,fhr 137 bpm,anterior placenta gr 1,SVP 6 cm,RI .72,.73,.71,.72=95% with EDF,BPP 8/8  Treatment Plan:  NST on 07/24/2019. Schedule IOL at 36 weeks.  Reviewed: Preterm labor symptoms and general obstetric precautions including but not limited to vaginal bleeding, contractions, leaking of fluid and fetal movement were reviewed in detail with the patient.    Follow-up: No follow-ups on file.  Orders Placed This Encounter  Procedures  . Pain Management Screening Profile (10S)  . POC Urinalysis Dipstick OB   By signing my name below, I, Arnette Norris, attest that this documentation has been prepared under the direction and in the presence of Tilda Burrow, MD. Electronically Signed: Arnette Norris Medical Scribe. 07/21/19. 5:00 PM.  I personally performed the services described in this documentation, which was SCRIBED in my presence. The recorded information has been reviewed and considered accurate.  It has been edited as necessary during review. Tilda Burrow, MD

## 2019-07-22 LAB — PMP SCREEN PROFILE (10S), URINE
Amphetamine Scrn, Ur: NEGATIVE ng/mL
BARBITURATE SCREEN URINE: NEGATIVE ng/mL
BENZODIAZEPINE SCREEN, URINE: NEGATIVE ng/mL
CANNABINOIDS UR QL SCN: POSITIVE ng/mL — AB
Cocaine (Metab) Scrn, Ur: NEGATIVE ng/mL
Creatinine(Crt), U: 174.9 mg/dL (ref 20.0–300.0)
Methadone Screen, Urine: NEGATIVE ng/mL
OXYCODONE+OXYMORPHONE UR QL SCN: NEGATIVE ng/mL
Opiate Scrn, Ur: NEGATIVE ng/mL
Ph of Urine: 6 (ref 4.5–8.9)
Phencyclidine Qn, Ur: NEGATIVE ng/mL
Propoxyphene Scrn, Ur: NEGATIVE ng/mL

## 2019-07-23 DIAGNOSIS — O30041 Twin pregnancy, dichorionic/diamniotic, first trimester: Secondary | ICD-10-CM

## 2019-07-23 DIAGNOSIS — Z3A34 34 weeks gestation of pregnancy: Secondary | ICD-10-CM | POA: Diagnosis not present

## 2019-07-24 ENCOUNTER — Ambulatory Visit (INDEPENDENT_AMBULATORY_CARE_PROVIDER_SITE_OTHER): Payer: Medicaid Other | Admitting: *Deleted

## 2019-07-24 ENCOUNTER — Other Ambulatory Visit: Payer: Self-pay

## 2019-07-24 DIAGNOSIS — O30041 Twin pregnancy, dichorionic/diamniotic, first trimester: Secondary | ICD-10-CM

## 2019-07-24 DIAGNOSIS — O0990 Supervision of high risk pregnancy, unspecified, unspecified trimester: Secondary | ICD-10-CM

## 2019-07-24 NOTE — Progress Notes (Signed)
   NURSE VISIT- NST  SUBJECTIVE:  Crystal Hines is a 24 y.o. G38P1011 female at [redacted]w[redacted]d, here for a NST for pregnancy complicated by Multiple gestation.  She reports active fetal movement, contractions: none, vaginal bleeding: none, membranes: intact.   OBJECTIVE:  LMP 11/26/2018   Appears well, no apparent distress  No results found for this or any previous visit (from the past 24 hour(s)).  NST: FHR baseline Twin A 125bpm, Variability: moderate, Accelerations:present, Decelerations:  Absent= Cat 1/Reactive NST: FHR baseline Twin B 130, Variability:moderate, Accelerations:present, Decels:absent=Cat 1/Reactive Toco: none   ASSESSMENT: G3P1011 at [redacted]w[redacted]d with Multiple gestation NST reactive  PLAN: EFM strip reviewed by Dr. Despina Hidden   Recommendations: keep next appointment as scheduled    Davyd Podgorski, Faith Rogue  07/24/2019 11:48 AM

## 2019-07-27 ENCOUNTER — Other Ambulatory Visit: Payer: Self-pay | Admitting: Obstetrics and Gynecology

## 2019-07-27 DIAGNOSIS — O30043 Twin pregnancy, dichorionic/diamniotic, third trimester: Secondary | ICD-10-CM

## 2019-07-27 DIAGNOSIS — IMO0002 Reserved for concepts with insufficient information to code with codable children: Secondary | ICD-10-CM

## 2019-07-28 ENCOUNTER — Ambulatory Visit (INDEPENDENT_AMBULATORY_CARE_PROVIDER_SITE_OTHER): Payer: Medicaid Other

## 2019-07-28 ENCOUNTER — Other Ambulatory Visit: Payer: Self-pay

## 2019-07-28 ENCOUNTER — Ambulatory Visit (INDEPENDENT_AMBULATORY_CARE_PROVIDER_SITE_OTHER): Payer: Medicaid Other | Admitting: Advanced Practice Midwife

## 2019-07-28 ENCOUNTER — Other Ambulatory Visit (HOSPITAL_COMMUNITY)
Admission: RE | Admit: 2019-07-28 | Discharge: 2019-07-28 | Disposition: A | Discharge status: Home / Self Care | Payer: Medicaid Other | Source: Ambulatory Visit | Attending: Obstetrics & Gynecology | Admitting: Obstetrics & Gynecology

## 2019-07-28 ENCOUNTER — Encounter: Payer: Self-pay | Admitting: Advanced Practice Midwife

## 2019-07-28 VITALS — Wt 241.2 lb

## 2019-07-28 DIAGNOSIS — Z3A34 34 weeks gestation of pregnancy: Secondary | ICD-10-CM | POA: Diagnosis not present

## 2019-07-28 DIAGNOSIS — IMO0002 Reserved for concepts with insufficient information to code with codable children: Secondary | ICD-10-CM

## 2019-07-28 DIAGNOSIS — O30043 Twin pregnancy, dichorionic/diamniotic, third trimester: Secondary | ICD-10-CM

## 2019-07-28 DIAGNOSIS — O0990 Supervision of high risk pregnancy, unspecified, unspecified trimester: Secondary | ICD-10-CM

## 2019-07-28 DIAGNOSIS — O365930 Maternal care for other known or suspected poor fetal growth, third trimester, not applicable or unspecified: Secondary | ICD-10-CM | POA: Diagnosis not present

## 2019-07-28 DIAGNOSIS — Z302 Encounter for sterilization: Secondary | ICD-10-CM | POA: Insufficient documentation

## 2019-07-28 NOTE — Progress Notes (Signed)
   HIGH-RISK PREGNANCY VISIT Patient name: Crystal Hines MRN [id number]  Date of birth: 03/11/1996 Chief Complaint:   High Risk Gestation ([place] today; + pain and pressure since Sunday)  History of Present Illness:   Crystal Hines is a 24 y.o. G82P1011 female at [redacted]w[redacted]d with an Estimated Date of Delivery: 09/02/19 being seen today for ongoing management of a high-risk pregnancy complicated by multiple gestation DiDitwins. FGR of A.  Today she reports no complaints. Contractions: Irregular. Vag. Bleeding: None.  Movement: Present. denies leaking of fluid.  Review of Systems:   Pertinent items are noted in HPI Denies abnormal vaginal discharge w/ itching/odor/irritation, headaches, visual changes, shortness of breath, chest pain, abdominal pain, severe nausea/vomiting, or problems with urination or bowel movements unless otherwise stated above. Pertinent History Reviewed:  Reviewed past medical,surgical, social, obstetrical and family history.  Reviewed problem list, medications and allergies. Physical Assessment:   Vitals:   07/28/19 1412  Weight: 241 lb 3.2 oz (109.4 kg)  Body mass index is 36.67 kg/m.           Physical Examination:   General appearance: alert, well appearing, and in no distress  Mental status: alert, oriented to person, place, and time  Skin: warm & dry   Extremities: Edema: None    Cardiovascular: normal heart rate noted  Respiratory: normal respiratory effort, no distress  Abdomen: gravid, soft, non-tender  Pelvic: Cervical exam deferred         Fetal Status: Fetal Heart Rate (bpm): 124/133   Movement: Present    Fetal Surveillance Testing today:  [place] 34+6 wks,DI/DI twins BABY A: cephalic right,posterior placenta gr 1,fhr 124 bpm,svp of fluid 7.6 cm,RI .57,.60,.57=49%,BPP 8/8  BABY B: cephalic left,anterior placenta gr 1,fhr 133 bpm,svp of fluid 6.5 cm,elevated UAD with EDF,RI .73,.69,.68,.74=93%,BPP 8/8  Chaperone: Malachy Mood    No results found for this  or any previous visit (from the past 24 hour(s)).  Assessment & Plan:  1) High-risk pregnancy G3P1011 at [redacted]w[redacted]d with an Estimated Date of Delivery: 09/02/19   2) [name] twins, FGR of A with 16th% discordance from 33wk scan; slightly elevated dopplers of Twin B at 93rd%   3) gHTN, similar BPs to past week (149/92); states she is in an argument on the phone with family member; will recheck at home and call if >140/90  Meds: No orders of the defined types were placed in this encounter.   Labs/procedures today: GBS/GC/chlam today  Treatment Plan:  Plan for IOL at 36wk- will schedule at visit 2/19  Reviewed: Preterm labor symptoms and general obstetric precautions including but not limited to vaginal bleeding, contractions, leaking of fluid and fetal movement were reviewed in detail with the patient.  All questions were answered. Has home bp cuff. Check bp weekly, let us know if >140/90.   Follow-up: Return for keep visit on 2/19.  Orders Placed This Encounter  Procedures  . Strep Gp B NAA   Arabella Merles [facility] 07/28/2019 2:49 PM

## 2019-07-28 NOTE — Progress Notes (Signed)
[place] 34+6 wks,DI/DI twins BABY A: cephalic right,posterior placenta gr 1,fhr 124 bpm,svp of fluid 7.6 cm,RI .57,.60,.57=49%,BPP 8/8 BABY B: cephalic left,anterior placenta gr 1,fhr 133 bpm,svp of fluid 6.5 cm,elevated UAD with EDF,RI .73,.69,.68,.74=93%,BPP 8/8

## 2019-07-30 LAB — CERVICOVAGINAL ANCILLARY ONLY
Chlamydia: NEGATIVE
Comment: NEGATIVE
Comment: NORMAL
Neisseria Gonorrhea: NEGATIVE

## 2019-07-30 LAB — STREP GP B NAA: Strep Gp B NAA: POSITIVE — AB

## 2019-07-31 ENCOUNTER — Ambulatory Visit: Payer: Medicaid Other | Admitting: *Deleted

## 2019-07-31 ENCOUNTER — Encounter: Payer: Self-pay | Admitting: *Deleted

## 2019-07-31 ENCOUNTER — Other Ambulatory Visit: Payer: Self-pay

## 2019-07-31 DIAGNOSIS — O0990 Supervision of high risk pregnancy, unspecified, unspecified trimester: Secondary | ICD-10-CM

## 2019-07-31 NOTE — Progress Notes (Signed)
   NURSE VISIT- NST  SUBJECTIVE:  Crystal Hines is a 24 y.o. G15P1011 female at [redacted]w[redacted]d, here for a NST for pregnancy complicated by Multiple gestation.  She reports active fetal movement, contractions: none, vaginal bleeding: none, membranes: intact.   OBJECTIVE:  LMP 11/26/2018   Appears well, no apparent distress  No results found for this or any previous visit (from the past 24 hour(s)).  NST: FHR baseline  bpm, Variability: moderate, Accelerations:present, Decelerations:  Absent= Cat 1/Reactive Toco: none   ASSESSMENT: G3P1011 at [redacted]w[redacted]d with Multiple gestation NST reactive  PLAN: EFM strip reviewed by Dr. Despina Hidden   Recommendations: keep next appointment as scheduled    Tonea Leiphart, Faith Rogue  07/31/2019 10:27 AM

## 2019-08-01 ENCOUNTER — Other Ambulatory Visit: Payer: Self-pay | Admitting: Family Medicine

## 2019-08-01 DIAGNOSIS — O0990 Supervision of high risk pregnancy, unspecified, unspecified trimester: Secondary | ICD-10-CM

## 2019-08-01 DIAGNOSIS — Z302 Encounter for sterilization: Secondary | ICD-10-CM

## 2019-08-03 ENCOUNTER — Other Ambulatory Visit (HOSPITAL_COMMUNITY): Admission: RE | Admit: 2019-08-03 | Payer: Medicaid Other | Source: Ambulatory Visit

## 2019-08-03 ENCOUNTER — Telehealth (HOSPITAL_COMMUNITY): Payer: Self-pay | Admitting: *Deleted

## 2019-08-03 ENCOUNTER — Encounter: Payer: Self-pay | Admitting: Advanced Practice Midwife

## 2019-08-03 DIAGNOSIS — B951 Streptococcus, group B, as the cause of diseases classified elsewhere: Secondary | ICD-10-CM | POA: Insufficient documentation

## 2019-08-03 NOTE — Telephone Encounter (Signed)
Preadmission screen  

## 2019-08-04 ENCOUNTER — Other Ambulatory Visit (HOSPITAL_COMMUNITY): Payer: Self-pay | Admitting: *Deleted

## 2019-08-04 ENCOUNTER — Ambulatory Visit (INDEPENDENT_AMBULATORY_CARE_PROVIDER_SITE_OTHER): Payer: Medicaid Other | Admitting: Obstetrics and Gynecology

## 2019-08-04 ENCOUNTER — Other Ambulatory Visit: Payer: Self-pay

## 2019-08-04 ENCOUNTER — Other Ambulatory Visit: Payer: Self-pay | Admitting: Obstetrics and Gynecology

## 2019-08-04 ENCOUNTER — Other Ambulatory Visit (HOSPITAL_COMMUNITY)
Admission: RE | Admit: 2019-08-04 | Discharge: 2019-08-04 | Disposition: A | Discharge status: Home / Self Care | Payer: Medicaid Other | Source: Ambulatory Visit | Attending: Obstetrics and Gynecology | Admitting: Obstetrics and Gynecology

## 2019-08-04 ENCOUNTER — Telehealth: Payer: Self-pay | Admitting: Obstetrics and Gynecology

## 2019-08-04 ENCOUNTER — Other Ambulatory Visit: Payer: Self-pay | Admitting: Advanced Practice Midwife

## 2019-08-04 ENCOUNTER — Telehealth (HOSPITAL_COMMUNITY): Payer: Self-pay | Admitting: *Deleted

## 2019-08-04 VITALS — BP 142/94 | HR 88 | Wt 242.8 lb

## 2019-08-04 DIAGNOSIS — O30043 Twin pregnancy, dichorionic/diamniotic, third trimester: Secondary | ICD-10-CM

## 2019-08-04 DIAGNOSIS — O30041 Twin pregnancy, dichorionic/diamniotic, first trimester: Secondary | ICD-10-CM

## 2019-08-04 DIAGNOSIS — Z3A35 35 weeks gestation of pregnancy: Secondary | ICD-10-CM

## 2019-08-04 DIAGNOSIS — Z8759 Personal history of other complications of pregnancy, childbirth and the puerperium: Secondary | ICD-10-CM

## 2019-08-04 LAB — SARS CORONAVIRUS 2 (TAT 6-24 HRS): SARS Coronavirus 2: NEGATIVE

## 2019-08-04 NOTE — Progress Notes (Addendum)
Patient ID: Neta Ehlers, female   DOB: August 16, 1995, 24 y.o.   MRN: [id number]     [facility] PREGNANCY VISIT Patient name: Crystal Hines MRN [id number]  Date of birth: Sep 09, 1995 Chief Complaint:   Routine Prenatal Visit  History of Present Illness:   Crystal Hines is a 24 y.o. G47P1011 female at [redacted]w[redacted]d with an Estimated Date of Delivery: 09/02/19 being seen today for ongoing management of a high-risk pregnancy complicated by multiple gestation: [name] Twins, with fetal growth restriction of baby A, located on right at the 7th percentile, baby B on left growing at 47 %ile as of 32 week u/s   She did not get get a f/u growth u/s this week, and is scheduled for IOL at 36 wk, tomorrow,, She also has chronic HTN versus Gestational HTN, with several BP's in 140/ 90 range since 18 wks. She has not been placed on BP meds. Today's BP 142/94 Today she reports no complaints. She denies headache or scotoma.   Contractions: Irregular. Vag. Bleeding: None.  Movement: Present. denies leaking of fluid.  Review of Systems:   Pertinent items are noted in HPI Denies abnormal vaginal discharge w/ itching/odor/irritation, headaches, visual changes, shortness of breath, chest pain, abdominal pain, severe nausea/vomiting, or problems with urination or bowel movements unless otherwise stated above. Pertinent History Reviewed:  Reviewed past medical,surgical, social, obstetrical and family history.  Reviewed problem list, medications and allergies. Physical Assessment:   Vitals:   08/04/19 1134  BP: (!) 142/94  Pulse: 88  Weight: 242 lb 12.8 oz (110.1 kg)  Body mass index is 36.92 kg/m.           Physical Examination:   General appearance: alert, well appearing, and in no distress  Mental status: alert, oriented to person, place, and time, normal mood, behavior, speech, dress, motor activity, and thought processes, affect appropriate to mood  Skin: warm & dry   Extremities: Edema: None    Cardiovascular:  normal heart rate noted  Respiratory: normal respiratory effort, no distress  Abdomen: gravid, soft, non-tender  Pelvic: Cervical exam deferred         Fetal Status:     Movement: Present   baby A is vertex, baby B is breech by u/s at bedside today, good FM x 2 and AF volume.  Fetal Surveillance Testing today: Portable bedside abdominal U/S position check  No results found for this or any previous visit (from the past 24 hour(s)).  Assessment & Plan:  1) High-risk pregnancy G3P1011 at [redacted]w[redacted]d with an Estimated Date of Delivery: 09/02/19   2) DiDiTwins, with FGR @ 7%ile baby A (on right), growth descrepancy 16% at 32 wk scan.  3) IOL scheduled for tomorrow, 08/05/2019.   Since she has not had a growth u/s at 36 wk, I have contacted Dr Judeth Cornfield, and arranged for his on call u/s technologist to come up after admission for a growth u/s, and probable dopplers, so that plans for IOL can be clarified.  Meds: No orders of the defined types were placed in this encounter.  Labs/procedures today: none  Treatment Plan:  Plan for IOL at 36 wks, scheduled for tomorrow 08/05/2019  Follow-up: Return in about 4 weeks (around 09/01/2019) for Postpartum chk.  By signing my name below, I, Pietro Cassis, attest that this documentation has been prepared under the direction and in the presence of Tilda Burrow, MD. Electronically Signed: Pietro Cassis, Medical Scribe. 08/04/19. 12:00 PM.  I personally performed the services described in this documentation,  which was SCRIBED in my presence. The recorded information has been reviewed and considered accurate. It has been edited as necessary during review. Jonnie Kind, MD

## 2019-08-04 NOTE — Telephone Encounter (Signed)
Preadmission screen  

## 2019-08-04 NOTE — Progress Notes (Signed)
CMP and urine PR/Cr ratio added to admit labs.

## 2019-08-04 NOTE — Telephone Encounter (Signed)
I spoke to [employer] by phone , explaining in detail the plans for tomorrow, for admission for delivery, for MFM to perform a growth u/s upon arrival to further assist with delivery planning, as baby B is the larger infant based on 32 wk u/s. Pt understands that depending on size of the babies, that recommended method of delivery can be changed. Pt states that while she prefers to deliver vaginally if reasonable, that she is not adverse to cesarean delivery. She does want Tubal ligation if cesarean is performed.  Pt will remain NPO x water until admission assessment is completed.

## 2019-08-05 ENCOUNTER — Inpatient Hospital Stay (HOSPITAL_COMMUNITY)
Admission: AD | Admit: 2019-08-05 | Discharge: 2019-08-08 | DRG: 798 | Disposition: A | Discharge status: Home / Self Care | Payer: Medicaid Other | Attending: Obstetrics and Gynecology | Admitting: Obstetrics and Gynecology

## 2019-08-05 ENCOUNTER — Inpatient Hospital Stay (HOSPITAL_COMMUNITY): Payer: Medicaid Other

## 2019-08-05 ENCOUNTER — Encounter (HOSPITAL_COMMUNITY): Payer: Self-pay | Admitting: Obstetrics and Gynecology

## 2019-08-05 ENCOUNTER — Inpatient Hospital Stay (HOSPITAL_COMMUNITY): Payer: Medicaid Other | Admitting: Anesthesiology

## 2019-08-05 DIAGNOSIS — O30041 Twin pregnancy, dichorionic/diamniotic, first trimester: Secondary | ICD-10-CM | POA: Diagnosis present

## 2019-08-05 DIAGNOSIS — O134 Gestational [pregnancy-induced] hypertension without significant proteinuria, complicating childbirth: Principal | ICD-10-CM | POA: Diagnosis present

## 2019-08-05 DIAGNOSIS — Z87891 Personal history of nicotine dependence: Secondary | ICD-10-CM | POA: Diagnosis not present

## 2019-08-05 DIAGNOSIS — Z3A34 34 weeks gestation of pregnancy: Secondary | ICD-10-CM | POA: Diagnosis not present

## 2019-08-05 DIAGNOSIS — F1410 Cocaine abuse, uncomplicated: Secondary | ICD-10-CM | POA: Diagnosis present

## 2019-08-05 DIAGNOSIS — O99824 Streptococcus B carrier state complicating childbirth: Secondary | ICD-10-CM | POA: Diagnosis present

## 2019-08-05 DIAGNOSIS — Z20822 Contact with and (suspected) exposure to covid-19: Secondary | ICD-10-CM | POA: Diagnosis present

## 2019-08-05 DIAGNOSIS — O0990 Supervision of high risk pregnancy, unspecified, unspecified trimester: Secondary | ICD-10-CM

## 2019-08-05 DIAGNOSIS — D649 Anemia, unspecified: Secondary | ICD-10-CM | POA: Diagnosis present

## 2019-08-05 DIAGNOSIS — O30043 Twin pregnancy, dichorionic/diamniotic, third trimester: Secondary | ICD-10-CM | POA: Diagnosis present

## 2019-08-05 DIAGNOSIS — O9902 Anemia complicating childbirth: Secondary | ICD-10-CM | POA: Diagnosis present

## 2019-08-05 DIAGNOSIS — O139 Gestational [pregnancy-induced] hypertension without significant proteinuria, unspecified trimester: Secondary | ICD-10-CM | POA: Diagnosis present

## 2019-08-05 DIAGNOSIS — Z3A36 36 weeks gestation of pregnancy: Secondary | ICD-10-CM | POA: Diagnosis not present

## 2019-08-05 DIAGNOSIS — O365931 Maternal care for other known or suspected poor fetal growth, third trimester, fetus 1: Secondary | ICD-10-CM | POA: Diagnosis not present

## 2019-08-05 DIAGNOSIS — O10919 Unspecified pre-existing hypertension complicating pregnancy, unspecified trimester: Secondary | ICD-10-CM

## 2019-08-05 DIAGNOSIS — B951 Streptococcus, group B, as the cause of diseases classified elsewhere: Secondary | ICD-10-CM

## 2019-08-05 DIAGNOSIS — O365930 Maternal care for other known or suspected poor fetal growth, third trimester, not applicable or unspecified: Secondary | ICD-10-CM | POA: Diagnosis not present

## 2019-08-05 DIAGNOSIS — Z302 Encounter for sterilization: Secondary | ICD-10-CM | POA: Diagnosis not present

## 2019-08-05 DIAGNOSIS — O30003 Twin pregnancy, unspecified number of placenta and unspecified number of amniotic sacs, third trimester: Secondary | ICD-10-CM | POA: Diagnosis not present

## 2019-08-05 LAB — COMPREHENSIVE METABOLIC PANEL
ALT: 16 U/L (ref 0–44)
AST: 18 U/L (ref 15–41)
Albumin: 2.9 g/dL — ABNORMAL LOW (ref 3.5–5.0)
Alkaline Phosphatase: 117 U/L (ref 38–126)
Anion gap: 11 (ref 5–15)
BUN: <5 mg/dL — ABNORMAL LOW (ref 6–20)
CO2: 19 mmol/L — ABNORMAL LOW (ref 22–32)
Calcium: 8.5 mg/dL — ABNORMAL LOW (ref 8.9–10.3)
Chloride: 110 mmol/L (ref 98–111)
Creatinine, Ser: 0.86 mg/dL (ref 0.44–1.00)
GFR calc Af Amer: >60 mL/min (ref >60)
GFR calc non Af Amer: >60 mL/min (ref >60)
Glucose, Bld: 76 mg/dL (ref 70–99)
Potassium: 3.7 mmol/L (ref 3.5–5.1)
Sodium: 140 mmol/L (ref 135–145)
Total Bilirubin: 0.5 mg/dL (ref 0.3–1.2)
Total Protein: 6.6 g/dL (ref 6.5–8.1)

## 2019-08-05 LAB — CBC
HCT: 32.5 % — ABNORMAL LOW (ref 36.0–46.0)
HCT: 33.4 % — ABNORMAL LOW (ref 36.0–46.0)
Hemoglobin: 10.1 g/dL — ABNORMAL LOW (ref 12.0–15.0)
Hemoglobin: 10.3 g/dL — ABNORMAL LOW (ref 12.0–15.0)
MCH: 24.1 pg — ABNORMAL LOW (ref 26.0–34.0)
MCH: 23.9 pg — ABNORMAL LOW (ref 26.0–34.0)
MCHC: 31.1 g/dL (ref 30.0–36.0)
MCHC: 30.8 g/dL (ref 30.0–36.0)
MCV: 77.6 fL — ABNORMAL LOW (ref 80.0–100.0)
MCV: 77.5 fL — ABNORMAL LOW (ref 80.0–100.0)
Platelets: 216 10*3/uL (ref 150–400)
Platelets: 212 10*3/uL (ref 150–400)
RBC: 4.19 MIL/uL (ref 3.87–5.11)
RBC: 4.31 MIL/uL (ref 3.87–5.11)
RDW: 15.1 % (ref 11.5–15.5)
RDW: 15.1 % (ref 11.5–15.5)
WBC: 6.7 10*3/uL (ref 4.0–10.5)
WBC: 7.1 10*3/uL (ref 4.0–10.5)
nRBC: 0 % (ref 0.0–0.2)
nRBC: 0 % (ref 0.0–0.2)

## 2019-08-05 LAB — TYPE AND SCREEN
ABO/RH(D): B POS
Antibody Screen: NEGATIVE

## 2019-08-05 LAB — RAPID URINE DRUG SCREEN, HOSP PERFORMED
Amphetamines: NOT DETECTED
Barbiturates: NOT DETECTED
Benzodiazepines: NOT DETECTED
Cocaine: NOT DETECTED
Opiates: NOT DETECTED
Tetrahydrocannabinol: POSITIVE — AB

## 2019-08-05 LAB — PROTEIN / CREATININE RATIO, URINE
Creatinine, Urine: 82.07 mg/dL
Protein Creatinine Ratio: 0.21 mg/mg{Cre} — ABNORMAL HIGH (ref 0.00–0.15)
Total Protein, Urine: 17 mg/dL

## 2019-08-05 LAB — RPR: RPR Ser Ql: NONREACTIVE

## 2019-08-05 MED ORDER — EPHEDRINE 5 MG/ML INJ: 10.0000 mg | INTRAVENOUS | Status: DC | PRN

## 2019-08-05 MED ORDER — FENTANYL-BUPIVACAINE-NACL 0.5-0.125-0.9 MG/250ML-% EP SOLN
12.0000 mL/h | EPIDURAL | Status: DC | PRN
  Filled 2019-08-05: qty 250

## 2019-08-05 MED ORDER — TERBUTALINE SULFATE 1 MG/ML IJ SOLN: 0.2500 mg | Freq: Once | INTRAMUSCULAR | Status: DC | PRN

## 2019-08-05 MED ORDER — PHENYLEPHRINE 40 MCG/ML (10ML) SYRINGE FOR IV PUSH (FOR BLOOD PRESSURE SUPPORT): 80.0000 ug | PREFILLED_SYRINGE | INTRAVENOUS | Status: DC | PRN

## 2019-08-05 MED ORDER — OXYTOCIN BOLUS FROM INFUSION
500.0000 mL | Freq: Once | INTRAVENOUS | Status: AC
  Administered 2019-08-06: 05:00:00 500 mL via INTRAVENOUS

## 2019-08-05 MED ORDER — HYDRALAZINE HCL 20 MG/ML IJ SOLN: 10.0000 mg | INTRAMUSCULAR | Status: DC | PRN

## 2019-08-05 MED ORDER — LACTATED RINGERS IV SOLN: INTRAVENOUS | Status: DC

## 2019-08-05 MED ORDER — PHENYLEPHRINE 40 MCG/ML (10ML) SYRINGE FOR IV PUSH (FOR BLOOD PRESSURE SUPPORT)
80.0000 ug | PREFILLED_SYRINGE | INTRAVENOUS | Status: DC | PRN
  Filled 2019-08-05: qty 10

## 2019-08-05 MED ORDER — LACTATED RINGERS IV SOLN
500.0000 mL | INTRAVENOUS | Status: DC | PRN
  Administered 2019-08-05: 21:00:00 500 mL via INTRAVENOUS

## 2019-08-05 MED ORDER — LIDOCAINE HCL (PF) 1 % IJ SOLN: 30.0000 mL | INTRAMUSCULAR | Status: DC | PRN

## 2019-08-05 MED ORDER — LABETALOL HCL 5 MG/ML IV SOLN: 80.0000 mg | INTRAVENOUS | Status: DC | PRN

## 2019-08-05 MED ORDER — PENICILLIN G POT IN DEXTROSE 60000 UNIT/ML IV SOLN
3.0000 10*6.[IU] | INTRAVENOUS | Status: DC
  Administered 2019-08-05 – 2019-08-06 (×3): 3 10*6.[IU] via INTRAVENOUS
  Filled 2019-08-05 (×3): qty 50

## 2019-08-05 MED ORDER — SODIUM CHLORIDE (PF) 0.9 % IJ SOLN
INTRAMUSCULAR | Status: DC | PRN
  Administered 2019-08-05: 12 mL/h via EPIDURAL

## 2019-08-05 MED ORDER — BETAMETHASONE SOD PHOS & ACET 6 (3-3) MG/ML IJ SUSP
12.0000 mg | INTRAMUSCULAR | Status: DC
  Administered 2019-08-05: 15:00:00 12 mg via INTRAMUSCULAR
  Filled 2019-08-05: qty 2
  Filled 2019-08-05: qty 5

## 2019-08-05 MED ORDER — DIPHENHYDRAMINE HCL 50 MG/ML IJ SOLN
12.5000 mg | INTRAMUSCULAR | Status: DC | PRN
  Administered 2019-08-06: 02:00:00 12.5 mg via INTRAVENOUS
  Filled 2019-08-05: qty 1

## 2019-08-05 MED ORDER — SOD CITRATE-CITRIC ACID 500-334 MG/5ML PO SOLN: 30.0000 mL | ORAL | Status: DC | PRN

## 2019-08-05 MED ORDER — OXYTOCIN 40 UNITS IN NORMAL SALINE INFUSION - SIMPLE MED
2.5000 [IU]/h | INTRAVENOUS | Status: DC
  Filled 2019-08-05: qty 1000

## 2019-08-05 MED ORDER — ONDANSETRON HCL 4 MG/2ML IJ SOLN: 4.0000 mg | Freq: Four times a day (QID) | INTRAMUSCULAR | Status: DC | PRN

## 2019-08-05 MED ORDER — MISOPROSTOL 50MCG HALF TABLET: 50.0000 ug | ORAL_TABLET | ORAL | Status: DC

## 2019-08-05 MED ORDER — LACTATED RINGERS IV SOLN: 500.0000 mL | Freq: Once | INTRAVENOUS | Status: DC

## 2019-08-05 MED ORDER — OXYTOCIN 40 UNITS IN NORMAL SALINE INFUSION - SIMPLE MED
1.0000 m[IU]/min | INTRAVENOUS | Status: DC
  Administered 2019-08-05: 2 m[IU]/min via INTRAVENOUS

## 2019-08-05 MED ORDER — ACETAMINOPHEN 325 MG PO TABS: 650.0000 mg | ORAL_TABLET | ORAL | Status: DC | PRN

## 2019-08-05 MED ORDER — SODIUM CHLORIDE 0.9 % IV SOLN
5.0000 10*6.[IU] | Freq: Once | INTRAVENOUS | Status: AC
  Administered 2019-08-05: 5 10*6.[IU] via INTRAVENOUS
  Filled 2019-08-05: qty 5

## 2019-08-05 MED ORDER — FENTANYL CITRATE (PF) 100 MCG/2ML IJ SOLN
100.0000 ug | INTRAMUSCULAR | Status: DC | PRN
  Administered 2019-08-05 (×2): 100 ug via INTRAVENOUS
  Filled 2019-08-05 (×2): qty 2

## 2019-08-05 MED ORDER — LABETALOL HCL 5 MG/ML IV SOLN: 40.0000 mg | INTRAVENOUS | Status: DC | PRN

## 2019-08-05 MED ORDER — LABETALOL HCL 5 MG/ML IV SOLN
20.0000 mg | INTRAVENOUS | Status: DC | PRN
  Administered 2019-08-05: 22:00:00 20 mg via INTRAVENOUS
  Filled 2019-08-05: qty 4

## 2019-08-05 MED ORDER — MISOPROSTOL 50MCG HALF TABLET
ORAL_TABLET | ORAL | Status: AC
  Administered 2019-08-05: 50 ug
  Filled 2019-08-05: qty 1

## 2019-08-05 MED ORDER — LIDOCAINE HCL (PF) 1 % IJ SOLN
INTRAMUSCULAR | Status: DC | PRN
  Administered 2019-08-05 (×2): 4 mL via EPIDURAL

## 2019-08-05 NOTE — Anesthesia Procedure Notes (Signed)
Epidural Patient location during procedure: OB Start time: 08/05/2019 9:27 PM End time: 08/05/2019 9:30 PM  Staffing Anesthesiologist: Kaylyn Layer, MD Performed: anesthesiologist   Preanesthetic Checklist Completed: patient identified, IV checked, risks and benefits discussed, monitors and equipment checked, pre-op evaluation and timeout performed  Epidural Patient position: sitting Prep: DuraPrep and site prepped and draped Patient monitoring: continuous pulse ox, blood pressure and heart rate Approach: midline Location: L3-L4 Injection technique: LOR air  Needle:  Needle type: Tuohy  Needle gauge: 17 G Needle length: 9 cm Needle insertion depth: 6 cm Catheter type: closed end flexible Catheter size: 19 Gauge Catheter at skin depth: 11 cm Test dose: negative and Other (1% lidocaine)  Assessment Events: blood not aspirated, injection not painful, no injection resistance, no paresthesia and negative IV test  Additional Notes Patient identified. Risks, benefits, and alternatives discussed with patient including but not limited to bleeding, infection, nerve damage, paralysis, failed block, incomplete pain control, headache, blood pressure changes, nausea, vomiting, reactions to medication, itching, and postpartum back pain. Confirmed with bedside nurse the patient's most recent platelet count. Confirmed with patient that they are not currently taking any anticoagulation, have any bleeding history, or any family history of bleeding disorders. Patient expressed understanding and wished to proceed. All questions were answered. Sterile technique was used throughout the entire procedure. Please see nursing notes for vital signs.   Crisp LOR on first pass. Test dose was given through epidural catheter and negative prior to continuing to dose epidural or start infusion. Warning signs of high block given to the patient including shortness of breath, tingling/numbness in hands, complete  motor block, or any concerning symptoms with instructions to call for help. Patient was given instructions on fall risk and not to get out of bed. All questions and concerns addressed with instructions to call with any issues or inadequate analgesia.  Reason for block:procedure for pain

## 2019-08-05 NOTE — Progress Notes (Signed)
Pt off monitors for one hour.  Pt is sitting on birthing ball.  [name] at bedside for support.

## 2019-08-05 NOTE — Progress Notes (Signed)
Awaiting MFM for [place] to determine POC

## 2019-08-05 NOTE — Anesthesia Preprocedure Evaluation (Signed)
Anesthesia Evaluation  Patient identified by MRN, date of birth, ID band Patient awake    Reviewed: Allergy & Precautions, Patient's Chart, lab work & pertinent test results  History of Anesthesia Complications Negative for: history of anesthetic complications  Airway Mallampati: II  TM Distance: >3 FB Neck ROM: Full    Dental no notable dental hx.    Pulmonary former smoker,    Pulmonary exam normal        Cardiovascular Normal cardiovascular exam     Neuro/Psych negative neurological ROS  negative psych ROS   GI/Hepatic negative GI ROS, Neg liver ROS,   Endo/Other  negative endocrine ROS  Renal/GU negative Renal ROS  negative genitourinary   Musculoskeletal negative musculoskeletal ROS (+)   Abdominal   Peds  Hematology  (+) anemia ,   Anesthesia Other Findings Day of surgery medications reviewed with patient.  Reproductive/Obstetrics (+) Pregnancy (twin gestation, gHTN)                             Anesthesia Physical Anesthesia Plan  ASA: II  Anesthesia Plan: Epidural   Post-op Pain Management:    Induction:   PONV Risk Score and Plan: Treatment may vary due to age or medical condition  Airway Management Planned: Natural Airway  Additional Equipment:   Intra-op Plan:   Post-operative Plan:   Informed Consent: I have reviewed the patients History and Physical, chart, labs and discussed the procedure including the risks, benefits and alternatives for the proposed anesthesia with the patient or authorized representative who has indicated his/her understanding and acceptance.       Plan Discussed with:   Anesthesia Plan Comments:         Anesthesia Quick Evaluation

## 2019-08-05 NOTE — Progress Notes (Signed)
Pt back from [place].  Spoke with Dr Morene Antu.  POC to proceed with induction to SVD.  Will check pt cervix and possibly start cytotec.

## 2019-08-05 NOTE — Progress Notes (Signed)
Went bedside to place Foley balloon. Placed manually and 60 mL water inserted. Patient tolerated well. Accidental AROM occurred with placement. Patient reports this happened with her first delivery as well with FB placement.   Jerilynn Birkenhead, MD [facility] Family Medicine Fellow, [facility] for [employer], [facility]

## 2019-08-05 NOTE — Progress Notes (Signed)
Patient ID: Crystal Hines, female   DOB: [birth year], 24 y.o.   MRN: [id number]  Sitting on edge of bed, rocking with ctx; Pit started at 1800; ROM with cervical foley placement  BP 144/96, P 94 FHR A: 135, +10x10accels, no decels FHR B: 130, +10x10accels, no decels Ctx q 3-69mins with Pit @ 3mu/min Cx deferred (was 4/60/-2)  [name] twin preg @36 .0wks gHTN IOL process  Continue to titrate Pit to achieve active labor Anticipate twin vag del  [facility] 08/05/2019

## 2019-08-05 NOTE — Progress Notes (Signed)
Patient ID: Neta Ehlers, female   DOB: 1995-11-11, 24 y.o.   MRN: [id number]  Feeling a lot of pressure and starting to feel urge to push; breathing thru ctx despite epidural; BMZ x 1; PCN x 2 doses  BPs 160/88, 162/93, P 87 FHR 120s x 2, +accels, no decels Ctx q 3 mins with Pit @ 53mu/min Cx C/C/vtx +1 per RN exam  IUP@36 .0 gHTN- just now with severe pressures End 1st stage GBS pos  Will give Labetalol IV for now; labs normal and BPs weren't in severe range until pain/pressure started Begin pushing Prepare for vag del  Arabella Merles [facility] 08/05/2019 10:24 PM

## 2019-08-05 NOTE — H&P (Addendum)
OBSTETRIC ADMISSION HISTORY AND PHYSICAL  Crystal Hines is a 24 y.o. female G2P1011 with IUP at [redacted]w[redacted]d by early u/s presenting for Di/Di twins gHTN. She reports +FMs, No LOF, no VB, no blurry vision, headaches or peripheral edema, and RUQ pain.  She plans on breast and bottle feeding. She request BTL for birth control. She received her prenatal care at [facility]  Dating: By LMP c/w 8wk u/s --->  Estimated Date of Delivery: 09/02/19  Sono: 2/24     @[redacted]w[redacted]d , CWD, normal anatomy, cephalic presentation, posterior and anterior placenta Twin A: 2226g, 25% EFW Twin B: 2295g, 32% EFW  Prenatal History/Complications: gHTN vs cHTN Di/Di twins with fetal growth restriction of baby A; resolved 2/24 +cocaine/THC in early pregnancy  3% discordance  Past Medical History: Past Medical History:  Diagnosis Date  . Pregnancy induced hypertension   . Pregnant 08/23/2015  . Vaginal Pap smear, abnormal     Past Surgical History: Past Surgical History:  Procedure Laterality Date  . NO PAST SURGERIES      Obstetrical History: OB History    Gravida  3   Para  1   Term  1   Preterm      AB  1   Living  1     SAB  1   TAB      Ectopic      Multiple  0   Live Births  1           Social History Social History   Socioeconomic History  . Marital status: Single    Spouse name: Not on file  . Number of children: 1  . Years of education: Not on file  . Highest education level: Not on file  Occupational History  . Not on file  Tobacco Use  . Smoking status: Former Smoker    Years: 2.00    Types: Cigarettes  . Smokeless tobacco: Never Used  Substance and Sexual Activity  . Alcohol use: No  . Drug use: No  . Sexual activity: Yes    Birth control/protection: None  Other Topics Concern  . Not on file  Social History Narrative  . Not on file   Social Determinants of Health   Financial Resource Strain:   . Difficulty of Paying Living Expenses: Not on file  Food  Insecurity:   . Worried About Charity fundraiser in the Last Year: Not on file  . Ran Out of Food in the Last Year: Not on file  Transportation Needs:   . Lack of Transportation (Medical): Not on file  . Lack of Transportation (Non-Medical): Not on file  Physical Activity:   . Days of Exercise per Week: Not on file  . Minutes of Exercise per Session: Not on file  Stress:   . Feeling of Stress : Not on file  Social Connections:   . Frequency of Communication with Friends and Family: Not on file  . Frequency of Social Gatherings with Friends and Family: Not on file  . Attends Religious Services: Not on file  . Active Member of Clubs or Organizations: Not on file  . Attends Archivist Meetings: Not on file  . Marital Status: Not on file    Family History: Family History  Problem Relation Age of Onset  . Hypertension Mother   . Hypertension Father   . Other Father        blood clots  . Hypertension Maternal Grandmother   . Hypertension Maternal Grandfather   .  Congestive Heart Failure Paternal Grandfather     Allergies: No Known Allergies  Medications Prior to Admission  Medication Sig Dispense Refill Last Dose  . aspirin EC 81 MG tablet Take 162 mg by mouth daily.     . ferrous sulfate (FERROUSUL) 325 (65 FE) MG tablet Take 1 tablet (325 mg total) by mouth 2 (two) times daily. 60 tablet 1   . Prenatal Vit-Fe Fumarate-FA (PNV PRENATAL PLUS MULTIVITAMIN) 27-1 MG TABS Take 1 tablet by mouth daily. 30 tablet 11      Review of Systems   All systems reviewed and negative except as stated in HPI  Blood pressure (!) 143/84, pulse 77, temperature 98 F (36.7 C), temperature source Oral, resp. rate 15, height 5\' 8"  (1.727 m), weight 110.2 kg, last menstrual period 11/26/2018, SpO2 99 %. General appearance: alert, cooperative and no distress Lungs: clear to auscultation bilaterally Heart: regular rate and rhythm Abdomen: soft, non-tender; bowel sounds normal Pelvic:  Deferred Extremities: Homans sign is negative, no sign of DVT Presentation: cephalic Fetal monitoring: Baseline: 125 bpm and 130 bpm, Variability: Good {> 6 bpm) and Accelerations: Reactive Uterine activity: None Dilation: 1 Effacement (%): 50 Station: -3 Exam by:: [email or web address] RNC   Prenatal labs: ABO, Rh: --/--/B POS, B POS Performed at [facility], [address], [place], [place]  [id number]/24 [id number]) Antibody: NEG (02/24 0859) Rubella: 2.55 (09/14 1525) RPR: NON REACTIVE (02/24 0859)  HBsAg: Negative (09/14 1525)  HIV: Non Reactive (12/30 04-14-2002)  GBS: --/Positive (02/16 0000)  1 hr Glucola 122 Genetic screening  MaterniT21:normal Anatomy 04-10-1990 Female x 2  Prenatal Transfer Tool  Maternal Diabetes: No Genetic Screening: Normal Maternal Ultrasounds/Referrals: Isolated EIF (echogenic intracardiac focus) Fetal Ultrasounds or other Referrals:  None Maternal Substance Abuse:  No Significant Maternal Medications:  None Significant Maternal Lab Results: Group B Strep positive  Results for orders placed or performed during the hospital encounter of 08/05/19 (from the past 24 hour(s))  CBC   Collection Time: 08/05/19  8:59 AM  Result Value Ref Range   WBC 6.7 4.0 - 10.5 K/uL   RBC 4.19 3.87 - 5.11 MIL/uL   Hemoglobin 10.1 (L) 12.0 - 15.0 g/dL   HCT 08/07/19 (L) 47.4 - 25.9 %   MCV 77.6 (L) 80.0 - 100.0 fL   MCH 24.1 (L) 26.0 - 34.0 pg   MCHC 31.1 30.0 - 36.0 g/dL   RDW 56.3 87.5 - 64.3 %   Platelets 216 150 - 400 K/uL   nRBC 0.0 0.0 - 0.2 %  RPR   Collection Time: 08/05/19  8:59 AM  Result Value Ref Range   RPR Ser Ql NON REACTIVE NON REACTIVE  Comprehensive metabolic panel   Collection Time: 08/05/19  8:59 AM  Result Value Ref Range   Sodium 140 135 - 145 mmol/L   Potassium 3.7 3.5 - 5.1 mmol/L   Chloride 110 98 - 111 mmol/L   CO2 19 (L) 22 - 32 mmol/L   Glucose, Bld 76 70 - 99 mg/dL   BUN <5 (L) 6 - 20 mg/dL   Creatinine, Ser 08/07/19 0.44 - 1.00 mg/dL   Calcium 8.5  (L) 8.9 - 10.3 mg/dL   Total Protein 6.6 6.5 - 8.1 g/dL   Albumin 2.9 (L) 3.5 - 5.0 g/dL   AST 18 15 - 41 U/L   ALT 16 0 - 44 U/L   Alkaline Phosphatase 117 38 - 126 U/L   Total Bilirubin 0.5 0.3 - 1.2  mg/dL   GFR calc non Af Amer >60 >60 mL/min   GFR calc Af Amer >60 >60 mL/min   Anion gap 11 5 - 15  Type and screen   Collection Time: 08/05/19  8:59 AM  Result Value Ref Range   ABO/RH(D) B POS    Antibody Screen NEG    Sample Expiration      08/08/2019,2359 Performed at [facility], [address], [place], [place] [address]   ABO/Rh   Collection Time: 08/05/19  8:59 AM  Result Value Ref Range   ABO/RH(D)      B POS Performed at [facility], [address] [place], [place] [address]     Patient Active Problem List   Diagnosis Date Noted  . Labor and delivery, indication for care 08/05/2019  . Group beta Strep positive 08/03/2019  . Request for sterilization 07/28/2019  . Supervision of high risk pregnancy, antepartum 02/23/2019  . Dichorionic diamniotic twin pregnancy in first trimester 02/23/2019  . History of gestational hypertension 02/23/2019  . Cocaine abuse (HCC) 10/20/2016  . Abnormal Pap smear of cervix 05/28/2016    Assessment/Plan:  Crystal Hines is a 24 y.o. G3P1011 at [redacted]w[redacted]d here for IOL for gHTN.  #Labor:Vertex/vertex by u/s. Will start induction with Cytotec and foley balloon. AROM/Pit as indicated. Anticipate SVD x 2 #Pain: Per patient request #FWB: Cat 1 #ID: GBS positive, PCN ordered #MOF: Both #MOC:BTL, consent signed 8/29 #Circ:  Outpatient #Preterm: Will give betamethasone   Jackelyn Poling, DO  08/05/2019, 2:27 PM  I saw and evaluated the patient. I agree with the findings and the plan of care as documented in the resident's note. Vertex Vertex by MFM [place] today. IUGR resolved for Twin A. cHTN vs gHTN; will proceed with IOL as per MFM. EFW 2200g for both. Cytotec given and will place Foley bulb. Anticipate SVDx2. UDS ordered due  to cocaine/THC hx.  For gHTN: hx of possible cHTN but not on medications; currently gHTN per MFM. Pr/Cr pending, CMP WNL. Serial BP's. Mag and Labetalol protocol if severe-range or symptomatic.   Jerilynn Birkenhead, MD [facility] Family Medicine Fellow, [facility] for [employer], [facility]

## 2019-08-05 NOTE — Progress Notes (Addendum)
Labor Progress Note Crystal Hines is a 24 y.o. G3P1011 at [redacted]w[redacted]d presented for IOL with Di/Di twins for gHTN. S:  Patient doing well without distress.  O:  BP (!) 151/90   Pulse 94   Temp 98 F (36.7 C) (Oral)   Resp 15   Ht 5\' 8"  (1.727 m)   Wt 110.2 kg   LMP 11/26/2018   SpO2 99%   BMI 36.95 kg/m  EFM: 130, 135/moderate/acels present  CVE: Dilation: 4 Effacement (%): 60 Cervical Position: Middle Station: -2 Presentation: Vertex Exam by:: [email or web address] RNC   A&P: 24 y.o. 25 [redacted]w[redacted]d IOL with Di/Di twins for gHTN. #Labor: Progressing well. Foley bulb out. Starting pitocin #Pain: Epidural #FWB: Cat 1 #GBS positive - PCN   [redacted]w[redacted]d, DO 5:57 PM

## 2019-08-06 ENCOUNTER — Encounter (HOSPITAL_COMMUNITY)
Admission: AD | Disposition: A | Discharge status: Home / Self Care | Payer: Self-pay | Source: Home / Self Care | Attending: Obstetrics and Gynecology

## 2019-08-06 ENCOUNTER — Encounter (HOSPITAL_COMMUNITY): Payer: Self-pay | Admitting: Obstetrics and Gynecology

## 2019-08-06 ENCOUNTER — Inpatient Hospital Stay (HOSPITAL_COMMUNITY): Payer: Medicaid Other | Admitting: Anesthesiology

## 2019-08-06 DIAGNOSIS — O30043 Twin pregnancy, dichorionic/diamniotic, third trimester: Secondary | ICD-10-CM

## 2019-08-06 DIAGNOSIS — O365931 Maternal care for other known or suspected poor fetal growth, third trimester, fetus 1: Secondary | ICD-10-CM

## 2019-08-06 DIAGNOSIS — O134 Gestational [pregnancy-induced] hypertension without significant proteinuria, complicating childbirth: Secondary | ICD-10-CM

## 2019-08-06 DIAGNOSIS — Z302 Encounter for sterilization: Secondary | ICD-10-CM

## 2019-08-06 DIAGNOSIS — Z3A36 36 weeks gestation of pregnancy: Secondary | ICD-10-CM

## 2019-08-06 HISTORY — PX: TUBAL LIGATION: SHX77

## 2019-08-06 LAB — ABO/RH: ABO/RH(D): B POS

## 2019-08-06 SURGERY — LIGATION, FALLOPIAN TUBE, POSTPARTUM
Anesthesia: Epidural | Laterality: Bilateral

## 2019-08-06 MED ORDER — FAMOTIDINE 20 MG PO TABS
40.0000 mg | ORAL_TABLET | Freq: Once | ORAL | Status: AC
  Administered 2019-08-06: 40 mg via ORAL
  Filled 2019-08-06: qty 2

## 2019-08-06 MED ORDER — SENNOSIDES-DOCUSATE SODIUM 8.6-50 MG PO TABS
2.0000 | ORAL_TABLET | ORAL | Status: DC
  Administered 2019-08-06 – 2019-08-07 (×2): 2 via ORAL
  Filled 2019-08-06 (×2): qty 2

## 2019-08-06 MED ORDER — FENTANYL CITRATE (PF) 100 MCG/2ML IJ SOLN
INTRAMUSCULAR | Status: AC
  Filled 2019-08-06: qty 2

## 2019-08-06 MED ORDER — WITCH HAZEL-GLYCERIN EX PADS: 1.0000 "application " | MEDICATED_PAD | CUTANEOUS | Status: DC | PRN

## 2019-08-06 MED ORDER — MEPERIDINE HCL 25 MG/ML IJ SOLN: 6.2500 mg | INTRAMUSCULAR | Status: DC | PRN

## 2019-08-06 MED ORDER — BENZOCAINE-MENTHOL 20-0.5 % EX AERO: 1.0000 "application " | INHALATION_SPRAY | CUTANEOUS | Status: DC | PRN

## 2019-08-06 MED ORDER — LIDOCAINE-EPINEPHRINE (PF) 2 %-1:200000 IJ SOLN
INTRAMUSCULAR | Status: AC
  Filled 2019-08-06: qty 10

## 2019-08-06 MED ORDER — MIDAZOLAM HCL 2 MG/2ML IJ SOLN
INTRAMUSCULAR | Status: AC
  Filled 2019-08-06: qty 2

## 2019-08-06 MED ORDER — SODIUM CHLORIDE 0.9 % IR SOLN: Status: DC | PRN

## 2019-08-06 MED ORDER — MIDAZOLAM HCL 5 MG/5ML IJ SOLN
INTRAMUSCULAR | Status: DC | PRN
  Administered 2019-08-06 (×2): 1 mg via INTRAVENOUS

## 2019-08-06 MED ORDER — DEXMEDETOMIDINE HCL IN NACL 200 MCG/50ML IV SOLN
INTRAVENOUS | Status: AC
  Filled 2019-08-06: qty 50

## 2019-08-06 MED ORDER — SODIUM BICARBONATE 8.4 % IV SOLN: INTRAVENOUS | Status: DC | PRN

## 2019-08-06 MED ORDER — HYDROMORPHONE HCL 1 MG/ML IJ SOLN: 0.2500 mg | INTRAMUSCULAR | Status: DC | PRN

## 2019-08-06 MED ORDER — FENTANYL CITRATE (PF) 100 MCG/2ML IJ SOLN
INTRAMUSCULAR | Status: DC | PRN
  Administered 2019-08-06: 50 ug via INTRAVENOUS

## 2019-08-06 MED ORDER — DEXMEDETOMIDINE HCL 200 MCG/2ML IV SOLN
INTRAVENOUS | Status: DC | PRN
  Administered 2019-08-06: 8 ug via INTRAVENOUS
  Administered 2019-08-06: 4 ug via INTRAVENOUS

## 2019-08-06 MED ORDER — OXYCODONE HCL 5 MG PO TABS: 5.0000 mg | ORAL_TABLET | Freq: Once | ORAL | Status: DC | PRN

## 2019-08-06 MED ORDER — DIPHENHYDRAMINE HCL 25 MG PO CAPS: 25.0000 mg | ORAL_CAPSULE | Freq: Four times a day (QID) | ORAL | Status: DC | PRN

## 2019-08-06 MED ORDER — OXYCODONE HCL 5 MG/5ML PO SOLN: 5.0000 mg | Freq: Once | ORAL | Status: DC | PRN

## 2019-08-06 MED ORDER — SIMETHICONE 80 MG PO CHEW
80.0000 mg | CHEWABLE_TABLET | ORAL | Status: DC | PRN
  Administered 2019-08-07 (×2): 80 mg via ORAL
  Filled 2019-08-06 (×2): qty 1

## 2019-08-06 MED ORDER — LACTATED RINGERS IV SOLN: INTRAVENOUS | Status: DC | PRN

## 2019-08-06 MED ORDER — FENTANYL CITRATE (PF) 100 MCG/2ML IJ SOLN
INTRAMUSCULAR | Status: DC | PRN
  Administered 2019-08-06: 100 ug via EPIDURAL

## 2019-08-06 MED ORDER — KETOROLAC TROMETHAMINE 30 MG/ML IJ SOLN
INTRAMUSCULAR | Status: AC
  Filled 2019-08-06: qty 1

## 2019-08-06 MED ORDER — COCONUT OIL OIL: 1.0000 "application " | TOPICAL_OIL | Status: DC | PRN

## 2019-08-06 MED ORDER — ONDANSETRON HCL 4 MG/2ML IJ SOLN
INTRAMUSCULAR | Status: AC
  Filled 2019-08-06: qty 2

## 2019-08-06 MED ORDER — ZOLPIDEM TARTRATE 5 MG PO TABS: 5.0000 mg | ORAL_TABLET | Freq: Every evening | ORAL | Status: DC | PRN

## 2019-08-06 MED ORDER — ONDANSETRON HCL 4 MG PO TABS: 4.0000 mg | ORAL_TABLET | ORAL | Status: DC | PRN

## 2019-08-06 MED ORDER — MEASLES, MUMPS & RUBELLA VAC IJ SOLR: 0.5000 mL | Freq: Once | INTRAMUSCULAR | Status: DC

## 2019-08-06 MED ORDER — BUPIVACAINE HCL (PF) 0.25 % IJ SOLN
INTRAMUSCULAR | Status: AC
  Filled 2019-08-06: qty 30

## 2019-08-06 MED ORDER — METOCLOPRAMIDE HCL 10 MG PO TABS
10.0000 mg | ORAL_TABLET | Freq: Once | ORAL | Status: AC
  Administered 2019-08-06: 13:00:00 10 mg via ORAL
  Filled 2019-08-06: qty 1

## 2019-08-06 MED ORDER — PROMETHAZINE HCL 25 MG/ML IJ SOLN: 6.2500 mg | INTRAMUSCULAR | Status: DC | PRN

## 2019-08-06 MED ORDER — KETOROLAC TROMETHAMINE 30 MG/ML IJ SOLN: 30.0000 mg | Freq: Once | INTRAMUSCULAR | Status: DC | PRN

## 2019-08-06 MED ORDER — IBUPROFEN 600 MG PO TABS
600.0000 mg | ORAL_TABLET | Freq: Four times a day (QID) | ORAL | Status: DC
  Administered 2019-08-06 – 2019-08-08 (×8): 600 mg via ORAL
  Filled 2019-08-06 (×8): qty 1

## 2019-08-06 MED ORDER — LACTATED RINGERS IV SOLN: INTRAVENOUS | Status: DC

## 2019-08-06 MED ORDER — LIDOCAINE-EPINEPHRINE (PF) 2 %-1:200000 IJ SOLN
INTRAMUSCULAR | Status: DC | PRN
  Administered 2019-08-06: 5 mL via EPIDURAL
  Administered 2019-08-06: 10 mL via EPIDURAL
  Administered 2019-08-06: 5 mL via EPIDURAL

## 2019-08-06 MED ORDER — BUPIVACAINE HCL (PF) 0.25 % IJ SOLN
INTRAMUSCULAR | Status: DC | PRN
  Administered 2019-08-06: 20 mL

## 2019-08-06 MED ORDER — TETANUS-DIPHTH-ACELL PERTUSSIS 5-2.5-18.5 LF-MCG/0.5 IM SUSP: 0.5000 mL | Freq: Once | INTRAMUSCULAR | Status: DC

## 2019-08-06 MED ORDER — DIBUCAINE (PERIANAL) 1 % EX OINT: 1.0000 "application " | TOPICAL_OINTMENT | CUTANEOUS | Status: DC | PRN

## 2019-08-06 MED ORDER — ACETAMINOPHEN 325 MG PO TABS: 650.0000 mg | ORAL_TABLET | ORAL | Status: DC | PRN

## 2019-08-06 MED ORDER — KETOROLAC TROMETHAMINE 30 MG/ML IJ SOLN
30.0000 mg | Freq: Once | INTRAMUSCULAR | Status: AC | PRN
  Administered 2019-08-06: 17:00:00 30 mg via INTRAVENOUS

## 2019-08-06 MED ORDER — PRENATAL MULTIVITAMIN CH
1.0000 | ORAL_TABLET | Freq: Every day | ORAL | Status: DC
  Administered 2019-08-07: 1 via ORAL
  Filled 2019-08-06: qty 1

## 2019-08-06 MED ORDER — TRANEXAMIC ACID-NACL 1000-0.7 MG/100ML-% IV SOLN
INTRAVENOUS | Status: AC
  Administered 2019-08-06: 04:00:00 1000 mg
  Filled 2019-08-06: qty 100

## 2019-08-06 MED ORDER — ONDANSETRON HCL 4 MG/2ML IJ SOLN
4.0000 mg | INTRAMUSCULAR | Status: DC | PRN
  Administered 2019-08-06: 4 mg via INTRAVENOUS

## 2019-08-06 MED ORDER — DEXMEDETOMIDINE HCL IN NACL 200 MCG/50ML IV SOLN
INTRAVENOUS | Status: DC | PRN
  Administered 2019-08-06: .3 ug/kg/h via INTRAVENOUS

## 2019-08-06 MED ORDER — OXYCODONE HCL 5 MG PO TABS: 5.0000 mg | ORAL_TABLET | ORAL | Status: DC | PRN

## 2019-08-06 SURGICAL SUPPLY — 23 items
DRSG OPSITE POSTOP 3X4 (GAUZE/BANDAGES/DRESSINGS) ×3 IMPLANT
DURAPREP 26ML APPLICATOR (WOUND CARE) ×3 IMPLANT
GLOVE BIOGEL PI IND STRL 6.5 (GLOVE) ×1 IMPLANT
GLOVE BIOGEL PI IND STRL 7.0 (GLOVE) ×1 IMPLANT
GLOVE BIOGEL PI INDICATOR 6.5 (GLOVE) ×2
GLOVE BIOGEL PI INDICATOR 7.0 (GLOVE) ×2
GLOVE SURG SS PI 6.0 STRL IVOR (GLOVE) ×3 IMPLANT
GOWN STRL REUS W/TWL LRG LVL3 (GOWN DISPOSABLE) ×6 IMPLANT
NEEDLE HYPO 22GX1.5 SAFETY (NEEDLE) IMPLANT
NS IRRIG 1000ML POUR BTL (IV SOLUTION) ×3 IMPLANT
PACK ABDOMINAL MINOR (CUSTOM PROCEDURE TRAY) ×3 IMPLANT
PROTECTOR NERVE ULNAR (MISCELLANEOUS) ×3 IMPLANT
SPONGE GAUZE 2X2 8PLY STER LF (GAUZE/BANDAGES/DRESSINGS) ×1
SPONGE GAUZE 2X2 8PLY STRL LF (GAUZE/BANDAGES/DRESSINGS) ×2 IMPLANT
SPONGE LAP 4X18 RFD (DISPOSABLE) IMPLANT
SUT PLAIN 0 NONE (SUTURE) ×3 IMPLANT
SUT VIC AB 0 CT1 27 (SUTURE) ×3
SUT VIC AB 0 CT1 27XBRD ANBCTR (SUTURE) ×1 IMPLANT
SUT VIC AB 3-0 PS2 18 (SUTURE) ×3 IMPLANT
SYR CONTROL 10ML LL (SYRINGE) IMPLANT
TOWEL OR 17X24 6PK STRL BLUE (TOWEL DISPOSABLE) ×6 IMPLANT
TRAY FOLEY CATH SILVER 14FR (SET/KITS/TRAYS/PACK) ×3 IMPLANT
WATER STERILE IRR 1000ML POUR (IV SOLUTION) ×3 IMPLANT

## 2019-08-06 NOTE — Lactation Note (Signed)
This note was copied from a baby's chart. Lactation Consultation Note Mom stated she was just going to formula feed. LC offered to hook up DEBP mom refused.  Patient Name: Arra Connaughton [facility] Date: 08/06/2019     Maternal Data    Feeding Feeding Type: Bottle Fed - Formula  LATCH Score                   Interventions    Lactation Tools Discussed/Used     Consult Status      Charyl Dancer 08/06/2019, 8:59 PM

## 2019-08-06 NOTE — Op Note (Signed)
PREOPERATIVE DIAGNOSIS:  Undesired fertility  POSTOPERATIVE DIAGNOSIS:  Undesired fertility  PROCEDURE:  Postpartum Bilateral Tubal Sterilization using Pomeroy method   ANESTHESIA:  Epidural  COMPLICATIONS:  None immediate.  ESTIMATED BLOOD LOSS:  Less than 20cc.  FLUIDS: 400 cc LR.  INDICATIONS: 24 y.o. yo [id number]  with undesired fertility,status post vaginal delivery, desires permanent sterilization. Risks and benefits of procedure discussed with patient including permanence of method, bleeding, infection, injury to surrounding organs and need for additional procedures. Risk failure of 0.5-1% with increased risk of ectopic gestation if pregnancy occurs was also discussed with patient.   FINDINGS:  Normal uterus, tubes, and ovaries.  TECHNIQUE: After informed consent was obtained, the patient was taken to the operating room where anesthesia was induced and found to be adequate. A small transverse, infraumbilical skin incision was made with the scalpel. This incision was carried down to the underlying layer of fascia. The fascia was grasped with Kocher clamps tented up and entered sharply with Mayo scissors. Underlying peritoneum was then identified tented up and entered sharply with Metzenbaum scissors. The fascia was tagged with 0 Vicryl. The patient's left fallopian tube was then identified, brought to the incision, and grasped with a Babcock clamp. The tube was then followed out to the fimbria. The Babcock clamp was then used to grasp the tube approximately 4 cm from the cornual region. A 3 cm segment of the tube was then ligated with free tie of plain gut suture, transected and excised. Good hemostasis was noted and the tube was returned to the abdomen. The right fallopian tube was then identified to its fimbriated end, ligated, and a 3 cm segment excised in a similar fashion. Excellent hemostasis was noted, and the tube returned to the abdomen. The fascia was re-approximated with 0 Vicryl.  The skin was closed in a subcuticular fashion with 3-0 Vicryl. Quarter percent Marcaine solution was then injected at the incision site. The patient tolerated the procedure well. Sponge, lap, and needle count were correct x2. The patient was taken to recovery room in stable condition.

## 2019-08-06 NOTE — Discharge Summary (Signed)
  Postpartum Discharge Summary     Patient Name: Crystal Hines DOB: 10/07/1995 MRN: [id number]  Date of admission: 08/05/2019 Delivering Provider:    Kelleher, BoyA Cannie [[id number]]  ANDERSON, HANNAH C    Rolf, BoyB Azaya [[id number]]  ANDERSON, HANNAH C    Date of discharge: 08/08/2019  Admitting diagnosis: Labor and delivery, indication for care [O75.9] Intrauterine pregnancy: [redacted]w[redacted]d     Secondary diagnosis:  Active Problems:   Cocaine abuse (HCC)   Dichorionic diamniotic twin pregnancy in first trimester   Request for sterilization   Group beta Strep positive   Gestational hypertension  Additional problems: none     Discharge diagnosis: Twin Preterm Pregnancy Delivered and Gestational Hypertension                                                                                                Post partum procedures:postpartum tubal ligation (08/06/2019)  Augmentation: AROM, Pitocin, Cytotec and Foley Balloon  Complications: None  Hospital course:  Induction of Labor With Vaginal Delivery   23 y.o. yo G3P1011 at [redacted]w[redacted]d was admitted to the hospital 08/05/2019 for induction of labor.  Indication for induction: Gestational hypertension and twin IUP at 36.0wks. There had previously been a discordancy as well as Twin A with FGR, but both had resolved on her U/S at admission. BPs remained essentially mod hypertensive with neg pre-e labs. Patient had an uncomplicated labor course using the usual cx ripening measures. Membrane Rupture Time/Date:    Schirmer, BoyA Jamilett [[id number]]  2:59 PM    Oliphant, BoyB Tonisha [[id number]]  2:59 PM  ,   Smylie, BoyA Chyan [[id number]]  08/05/2019    Khader, BoyB Carolee [[id number]]  08/05/2019    Intrapartum Procedures: Episiotomy:    Teall, BoyA Caragh [[id number]]  None [1]    Hebard, BoyB Frankee [[id number]]  None [1]                                          Lacerations:     Goerner, BoyA Shirle  [[id number]]  Periurethral [8]    Dery, BoyB Shandiin [[id number]]  Periurethral [8]   Patient had delivery of a Viable infant.  Information for the patient's newborn:  Markes, BoyA Shirla [[id number]]  Delivery Method: Vaginal, Spontaneous(Filed from Delivery Summary)  Information for the patient's newborn:  Brunetti, BoyB Sandee [[id number]]  Delivery Method: Vaginal, Spontaneous(Filed from Delivery Summary)      Rolison, BoyA Kami [[id number]]  08/06/2019    Perrott, BoyB Myrka [[id number]]  08/06/2019   Details of delivery can be found in separate delivery note.  Patient had a routine postpartum course. Norvasc 5 mg initiated for BP control and prescribed on discharge. Received BTL. Patient is discharged home 08/08/19. Delivery time:    Venturino, BoyA Tori [[id number]]  4:10 AM    Arnott, BoyB Skarlett [[id number]]  4:19 AM    Magnesium Sulfate received: No BMZ received: Yes Rhophylac:N/A MMR:N/A Transfusion:No  Physical exam  Vitals:   08/07/19 1610 08/07/19 2140 08/08/19 0000 08/08/19   0532  BP: (!) 156/87 (!) 155/80 (!) 149/79 125/81  Pulse:  80 86 86  Resp:  _0 Temp:  97.9 F (36.6 C)  98.1 F (36.7 C)  TempSrc:  Oral  Oral  SpO2:  100%  100%  Weight:      Height:       General: alert, cooperative and no distress Lochia: appropriate Uterine Fundus: firm Incision: Healing well with no significant drainage s/p BTL DVT Evaluation: No evidence of DVT seen on physical exam. Labs: Lab Results  Component Value Date   WBC 9.3 08/07/2019   HGB 9.8 (L) 08/07/2019   HCT 32.1 (L) 08/07/2019   MCV 77.7 (L) 08/07/2019   PLT 243 08/07/2019   CMP Latest Ref Rng & Units 08/07/2019  Glucose 70 - 99 mg/dL 84  BUN 6 - 20 mg/dL 7  Creatinine 0.44 - 1.00 mg/dL 0.77  Sodium 135 - 145 mmol/L 141  Potassium 3.5 - 5.1 mmol/L 3.9  Chloride 98 - 111 mmol/L 108  CO2 22 - 32 mmol/L 24  Calcium 8.9 - 10.3 mg/dL 8.8(L)  Total Protein 6.5 - 8.1 g/dL 6.2(L)   Total Bilirubin 0.3 - 1.2 mg/dL 0.6  Alkaline Phos 38 - 126 U/L 94  AST 15 - 41 U/L 28  ALT 0 - 44 U/L 23   Edinburgh Score: Edinburgh Postnatal Depression Scale Screening Tool 08/06/2019  I have been able to laugh and see the funny side of things. 0  I have looked forward with enjoyment to things. 0  I have blamed myself unnecessarily when things went wrong. 0  I have been anxious or worried for no good reason. 0  I have felt scared or panicky for no good reason. 0  Things have been getting on top of me. 0  I have been so unhappy that I have had difficulty sleeping. 0  I have felt sad or miserable. 0  I have been so unhappy that I have been crying. 0  The thought of harming myself has occurred to me. 0  Edinburgh Postnatal Depression Scale Total 0    Discharge instruction: per After Visit Summary and "Baby and Me Booklet".  After visit meds:  Allergies as of 08/08/2019   No Known Allergies     Medication List    STOP taking these medications   aspirin EC 81 MG tablet   ferrous sulfate 325 (65 FE) MG tablet Commonly known as: FerrouSul     TAKE these medications   acetaminophen 325 MG tablet Commonly known as: Tylenol Take 2 tablets (650 mg total) by mouth every 6 (six) hours as needed (for pain scale < 4).   amLODipine 5 MG tablet Commonly known as: NORVASC Take 1 tablet (5 mg total) by mouth daily.   ibuprofen 600 MG tablet Commonly known as: ADVIL Take 1 tablet (600 mg total) by mouth every 6 (six) hours.   oxyCODONE 5 MG immediate release tablet Commonly known as: Oxy IR/ROXICODONE Take 1 tablet (5 mg total) by mouth every 4 (four) hours as needed (pain scale 4-7).   prenatal multivitamin Tabs tablet Take 1 tablet by mouth daily at 12 noon.   senna-docusate 8.6-50 MG tablet Commonly known as: Senokot-S Take 2 tablets by mouth daily. Start taking on: August 09, 2019       Diet: routine diet  Activity: Advance as tolerated. Pelvic rest for 6  weeks.   Outpatient follow up:1wk BP check; 4wk PP visit Follow up Appt: Future  Appointments  Date Time Provider Department Center  08/12/2019  2:30 PM [facility] NURSE [facility]  09/08/2019  1:30 PM Booker, Kimberly R, CNM [facility]   Follow up Visit:  Please schedule this patient for Postpartum visit in: 4 weeks with the following provider: Any provider Virtual For C/S patients schedule nurse incision check in weeks 2 weeks: no High risk pregnancy complicated by: gHTN, twins Delivery mode:  SVD Anticipated Birth Control:  BTL done PP PP Procedures needed: BP check- 1wk- RN visit  Schedule Integrated BH visit: no   Newborn Data:   Castorena, BoyA Jerry [[id number]]  Live born female  Birth Weight: 2050gm (4lb 8.3oz)   APGAR: 8, 9  Newborn Delivery   Birth date/time: 08/06/2019 04:10:00 Delivery type: Vaginal, Spontaneous       Ihde, BoyB Taresa [[id number]]  Live born female  Birth Weight: 2225gm (4lb 14.5oz)  APGAR: 8, 9  Newborn Delivery   Birth date/time: 08/06/2019 04:19:00 Delivery type: Vaginal, Spontaneous      Baby Feeding: Both Disposition:rooming in   08/08/2019 Chelsea N Fair, MD   

## 2019-08-06 NOTE — Progress Notes (Signed)
24 y.o. yo [phone]  with undesired fertility,status post vaginal delivery who desires permanent sterilization. Risks and benefits of procedure discussed with patient including permanence of method, bleeding, infection, injury to surrounding organs and need for additional procedures. Risk failure of 0.5-1% with increased risk of ectopic gestation if pregnancy occurs was also discussed with patient.

## 2019-08-06 NOTE — Progress Notes (Signed)
Spoke to [employer], CSW, who stated she is planning to see patient later today.  Mother has expressed interest in Breastfeeding (and pumping) her twin boys.  Due to her history of cocaine and THC use, per lactation, we are not setting her up with a DEBP.  Breastfeeding is not being encouraged at this moment.  Infants' UDS pending.

## 2019-08-06 NOTE — Anesthesia Preprocedure Evaluation (Signed)
Anesthesia Evaluation  Patient identified by MRN, date of birth, ID band Patient awake    Reviewed: Allergy & Precautions, NPO status , Patient's Chart, lab work & pertinent test results  History of Anesthesia Complications Negative for: history of anesthetic complications  Airway Mallampati: II  TM Distance: >3 FB Neck ROM: Full    Dental no notable dental hx. (+) Teeth Intact, Dental Advisory Given   Pulmonary former smoker,    Pulmonary exam normal        Cardiovascular hypertension, Normal cardiovascular exam     Neuro/Psych negative neurological ROS  negative psych ROS   GI/Hepatic negative GI ROS, (+)     substance abuse  , Hx cocaine   Endo/Other  obesity BMI 37  Renal/GU negative Renal ROS  negative genitourinary   Musculoskeletal negative musculoskeletal ROS (+)   Abdominal (+) + obese,   Peds  Hematology  (+) anemia ,   Anesthesia Other Findings   Reproductive/Obstetrics (+) Pregnancy (twin gestation, gHTN)                             Anesthesia Physical  Anesthesia Plan  ASA: II  Anesthesia Plan: Epidural   Post-op Pain Management:    Induction:   PONV Risk Score and Plan: Treatment may vary due to age or medical condition, Ondansetron and Dexamethasone  Airway Management Planned: Natural Airway  Additional Equipment: None  Intra-op Plan:   Post-operative Plan:   Informed Consent: I have reviewed the patients History and Physical, chart, labs and discussed the procedure including the risks, benefits and alternatives for the proposed anesthesia with the patient or authorized representative who has indicated his/her understanding and acceptance.       Plan Discussed with: CRNA  Anesthesia Plan Comments:         Anesthesia Quick Evaluation

## 2019-08-06 NOTE — Transfer of Care (Signed)
Immediate Anesthesia Transfer of Care Note  Patient: Crystal Hines  Procedure(s) Performed: POST PARTUM TUBAL LIGATION (Bilateral )  Patient Location: PACU  Anesthesia Type:Epidural  Level of Consciousness: oriented and sedated  Airway & Oxygen Therapy: Patient Spontanous Breathing  Post-op Assessment: Report given to RN and Post -op Vital signs reviewed and stable  Post vital signs: Reviewed and stable  Last Vitals:  Vitals Value Taken Time  BP    Temp    Pulse 78 08/06/19 1558  Resp 18 08/06/19 1558  SpO2 99 % 08/06/19 1558  Vitals shown include unvalidated device data.  Last Pain:  Vitals:   08/06/19 1447  TempSrc: Oral  PainSc: 0-No pain         Complications: No apparent anesthesia complications

## 2019-08-06 NOTE — Addendum Note (Signed)
Addendum  created 08/06/19 1654 by Lannie Fields, DO   Attestation recorded in [place], Flowsheet accepted, Proofreader filed

## 2019-08-06 NOTE — Clinical Social Work Maternal (Addendum)
CLINICAL SOCIAL WORK MATERNAL/CHILD NOTE  Patient Details  Name: Crystal Hines MRN: [id number] Date of Birth: 27-May-1996  Date:  08/06/2019  Clinical Social Worker Initiating Note:  Durward Fortes, LCSW Date/Time: Initiated:  08/06/19/1200     Child's Name:  Crystal Hines, [name] Parents:  Mother, Father(Crystal Hines, Crystal Hines)   Need for Interpreter:  None   Reason for Referral:  Current Substance Use/Substance Use During Pregnancy ([name] positive for [facility] and tested positive for Cocaine in November 2020.)   Address:  [address] [place] [address]    Phone number:  [phone] (home)     Additional phone number: none   Household Members/Support Persons (HM/SP):   Household Member/Support Person 2, Household Member/Support Person 1   HM/SP Name Relationship DOB or Age  HM/SP -1 Crystal Hines [name]  23  HM/SP -2 MGF Mom's mother     HM/SP -3   Crystal Hines  daughter     HM/SP -Crystal Hines  [name]   10/07/1973  HM/SP -5   MGM  mom's father    HM/SP -6        HM/SP -7        HM/SP -8          Natural Supports (not living in the home):  Friends   Professional Supports: None   Employment: Unemployed   Type of Work: reports that she doesnt work.   Education:  High school graduate   Homebound arranged:  n/a  Financial Resources:  Medicaid   Other Resources:  Physicist, medical , WIC(plans to apply for [employer].)   Cultural/Religious Considerations Which May Impact Care:  none reported.   Strengths:  Ability to meet basic needs , Compliance with medical plan , Home prepared for child , Pediatrician chosen   Psychotropic Medications:      None reported.    Pediatrician:    [facility]  Pediatrician List:   [facility] Other([facility]  [facility]      Pediatrician Fax Number:    Risk Factors/Current Problems:  Substance Use     Cognitive State:  Insightful , Able to Concentrate , Alert    Mood/Affect:  Calm , Comfortable , Relaxed , Interested , Happy    CSW Assessment: CSW consulted as [name] had positive screens for substances during this pregnancy. CSW went to speak with [name] at bedside to address further needs.   CSW congratulated [name] and [name] on the birth of twin boys. CSW advised [name] of the HIPPA policy in which [name] expressed the desire for [name] to remain in the room. CSW understanding and advised [name] and [name] of CSW's role as well as the reason for CSW coming to visit with her. [name] set up in bed and was very attentive while speaking with CSW. [name] started by advising CSW that she did use THC earlier in her pregnancy. [name] reported that before she became pregnant she was a regular user of THC. [name] reported that if she used THC during her pregnancy it was "to gain an appetite and I couldn't even smoke a whole one (blunt) when I did smoke. [name] reported "my doctor told me that this would show up in my system but Im not sure where the other stuff came from". CSW inquired from [facility] on what  other "stuff" [name] was referring to.  [name] reported that she was told back in November that she was positive for Cocaine. Per [name] "me and him ([name]) aren't even sure where it came from because I don't not use Cocaine at all". CSW inquired from [facility] on if she has never had a history of using substances and again [name] reported that the only substances she used was THC. CSW understanding. [name] interjected during CSW and [name] speaking saying "I used". CSW asked for clarity from [name] on what he had been using. [name] reported that he used Cocaine back in November 2020 and suggested that this is how [name] tested positive. [name] reported "yeah he uses cocaine and then I was told that it can be transmitted through bodily fluids, and hes my husband so Im sure you know what I mean by bodily fluids". CSW expressed to both [name] and [name] that CSW hasn't heard of any research  that suggests this is true, however others have mentioned this in the past. [name] reported "once I stopped, she didn't test positive any more for Cocaine". CSW verbalized understanding this as well .  Once discussion of substance use minimized, CSW advised [name] and [name] of the hospital drug screen policy. [name] was advised that if either infants CDS or UDS comes back positive for any substances that [name] wasn't prescribed or given by a doctor, then CSW would need to make a CPS report. [name] reported that she was already aware of this as she dealt with the same with her oldest daughter. CSW asked if [name] had any previous CPS history and [name] was very open and honest. [name] reported that in 2018, CPS was called as infant at that time was positive to [facility]. [name] reported that [facility] CPS came out to her home during that time, and case was closed. CSW spoke with [facility] CPS to confirm that case was closed on July 2018.    CSW inquired from [facility] on her mental health history. [name] reports that she has no mental health history. [name] reported that she has support from [name], her family as well as his family. [name] reports that she has been feeling fine since giving birth. [name] currently denies SI and HI.  [name] expressed that she has all needed items to care for infants with no other needs.   CSW took time to provide [name] with PPD and SIDS education. [name] was advised to mange symptoms daily/weekly as they relate to PPD. [name] reported that she has a basinet for infant to sleep in once they have arrived home.   CSW will continue to follow UDS and CDS for infants and make report if warranted to CPS.   CSW Plan/Description:  Sudden Infant Death Syndrome (SIDS) Education, Perinatal Mood and Anxiety Disorder (PMADs) Education, CSW Will Continue to Monitor Umbilical Cord Tissue Drug Screen Results and Make Report if [facility] Drug Screen Policy Information    Loralie Champagne 08/06/2019, 12:53 PM

## 2019-08-06 NOTE — Anesthesia Postprocedure Evaluation (Signed)
Anesthesia Post Note  Patient: Deslyn Wivell  Procedure(s) Performed: POST PARTUM TUBAL LIGATION (Bilateral )     Patient location during evaluation: PACU Anesthesia Type: Epidural Level of consciousness: awake and alert Pain management: pain level controlled Vital Signs Assessment: post-procedure vital signs reviewed and stable Respiratory status: spontaneous breathing, nonlabored ventilation and respiratory function stable Cardiovascular status: stable Postop Assessment: no headache, no backache, epidural receding and patient able to bend at knees Anesthetic complications: no    Last Vitals:  Vitals:   08/06/19 1630 08/06/19 1645  BP: 124/62 121/60  Pulse: 73 70  Resp: 18 17  Temp:    SpO2: 99% 99%    Last Pain:  Vitals:   08/06/19 1645  TempSrc:   PainSc: Asleep   Pain Goal:    LLE Motor Response: No movement due to regional block (08/06/19 1645) LLE Sensation: Numbness (08/06/19 1645) RLE Motor Response: No movement due to regional block (08/06/19 1645) RLE Sensation: Numbness (08/06/19 1645)     Epidural/Spinal Function Cutaneous sensation: No Sensation (08/06/19 1645), Patient able to flex knees: No (08/06/19 1645), Patient able to lift hips off bed: No (08/06/19 1645), Back pain beyond tenderness at insertion site: No (08/06/19 1645), Progressively worsening motor and/or sensory loss: No (08/06/19 1645), Bowel and/or bladder incontinence post epidural: No (08/06/19 1645)  Lannie Fields

## 2019-08-06 NOTE — Anesthesia Postprocedure Evaluation (Signed)
Anesthesia Post Note  Patient: Crystal Hines  Procedure(s) Performed: AN AD HOC LABOR EPIDURAL     Patient location during evaluation: Mother Baby Anesthesia Type: Epidural Level of consciousness: awake and alert Pain management: pain level controlled Vital Signs Assessment: post-procedure vital signs reviewed and stable Respiratory status: spontaneous breathing, nonlabored ventilation and respiratory function stable Cardiovascular status: stable Postop Assessment: no headache, no backache and epidural receding Anesthetic complications: no    Last Vitals:  Vitals:   08/06/19 0716 08/06/19 0840  BP: 130/67 (!) 142/77  Pulse: 98 94  Resp:  18  Temp:  36.8 C  SpO2:  100%    Last Pain:  Vitals:   08/06/19 0840  TempSrc: Oral  PainSc:    Pain Goal:                   Karena Kinker

## 2019-08-06 NOTE — Progress Notes (Signed)
Patient ID: Neta Ehlers, female   DOB: [birth year], 24 y.o.   MRN: [id number]  Comfortable with epidural; rec'd IV Lab x 1 @ 2222  BPs 146/98, 136/71, 130/71, 132/81 FHR 120-125, +accels, no decels x 2 Ctx q 3 mins  Cx 9/100/vtx +1  IUP@36 .1wks gHTN Active labor/transition  -Making good cx change and descent -BPs back to nl/mild hypertensive after one dose of Labetalol- will hold off on dx of pre-e for now and continue to observe BPs -Plan to check cx and hopefully start pushing by 2hrs, if not sooner  Arabella Merles CNM 08/06/2019 2:00 AM

## 2019-08-06 NOTE — Progress Notes (Signed)
Faculty Practice OB/GYN Attending Note  24 y.o. [phone] s/p recent vaginal delivery of twins at [redacted]w[redacted]d who desires permanent sterilization. Medicaid papers had been signed on 06/23/2019.  Other reversible forms of contraception including more effective LARCs such as IUD or Nexplanon were discussed with patient; she declines all other modalities. Her [name] also declined vasectomy, after being counseled about this procedure being less invasive and more effective.   Details of postpartum tubal sterilization discussed in detail.  Risks of procedure discussed with patient including but not limited to: risk of regret, permanence of method, bleeding, infection, injury to surrounding organs and need for additional procedures.  Failure risk of about 1% with increased risk of ectopic gestation if pregnancy occurs was also discussed with patient.  Patient verbalized understanding of these risks and wants to proceed with sterilization.  Procedure has been scheduled for 1500 today.  Patient understands that her procedure may be delayed by any cases coming from L&D or [facility], or any other urgent events in [facility] as this is an elective procedure.    NPO and other preoperative orders placed.  Will continue close observation and postpartum care as ordered.    Jaynie Collins, MD, FACOG Obstetrician & Gynecologist, [facility] for [employer], [facility]

## 2019-08-07 LAB — COMPREHENSIVE METABOLIC PANEL
ALT: 23 U/L (ref 0–44)
AST: 28 U/L (ref 15–41)
Albumin: 2.7 g/dL — ABNORMAL LOW (ref 3.5–5.0)
Alkaline Phosphatase: 94 U/L (ref 38–126)
Anion gap: 9 (ref 5–15)
BUN: 7 mg/dL (ref 6–20)
CO2: 24 mmol/L (ref 22–32)
Calcium: 8.8 mg/dL — ABNORMAL LOW (ref 8.9–10.3)
Chloride: 108 mmol/L (ref 98–111)
Creatinine, Ser: 0.77 mg/dL (ref 0.44–1.00)
GFR calc Af Amer: >60 mL/min (ref >60)
GFR calc non Af Amer: >60 mL/min (ref >60)
Glucose, Bld: 84 mg/dL (ref 70–99)
Potassium: 3.9 mmol/L (ref 3.5–5.1)
Sodium: 141 mmol/L (ref 135–145)
Total Bilirubin: 0.6 mg/dL (ref 0.3–1.2)
Total Protein: 6.2 g/dL — ABNORMAL LOW (ref 6.5–8.1)

## 2019-08-07 LAB — CBC
HCT: 32.1 % — ABNORMAL LOW (ref 36.0–46.0)
Hemoglobin: 9.8 g/dL — ABNORMAL LOW (ref 12.0–15.0)
MCH: 23.7 pg — ABNORMAL LOW (ref 26.0–34.0)
MCHC: 30.5 g/dL (ref 30.0–36.0)
MCV: 77.7 fL — ABNORMAL LOW (ref 80.0–100.0)
Platelets: 243 10*3/uL (ref 150–400)
RBC: 4.13 MIL/uL (ref 3.87–5.11)
RDW: 15.3 % (ref 11.5–15.5)
WBC: 9.3 10*3/uL (ref 4.0–10.5)
nRBC: 0 % (ref 0.0–0.2)

## 2019-08-07 MED ORDER — AMLODIPINE BESYLATE 5 MG PO TABS
5.0000 mg | ORAL_TABLET | Freq: Every day | ORAL | Status: DC
  Administered 2019-08-07 – 2019-08-08 (×2): 5 mg via ORAL
  Filled 2019-08-07 (×2): qty 1

## 2019-08-07 NOTE — Progress Notes (Addendum)
Post Partum Day 1  Subjective: no complaints, up ad lib, voiding, tolerating PO and + flatus, no BM yet  Objective: Blood pressure (!) 133/95, pulse 65, temperature 97.9 F (36.6 C), temperature source Oral, resp. rate 18, height 5' 8"  (1.727 m), weight 110.2 kg, last menstrual period 11/26/2018, SpO2 100 %, unknown if currently breastfeeding.  Physical Exam:  General: alert, cooperative, appears stated age and no distress Lochia: appropriate Uterine Fundus: firm Incision: None DVT Evaluation: No evidence of DVT seen on physical exam. No significant calf/ankle edema.  Recent Labs    08/05/19 0859 08/05/19 1912  HGB 10.1* 10.3*  HCT 32.5* 33.4*    Assessment/Plan: Breastfeeding and Contraception patient s/p BTL 2/25 Bps ON as elevated as 148/85, others 130/80--will start antihypertensives if BP remains elevated UDS +THC (08/05/2019)    LOS: 2 days   Crystal Hines 08/07/2019, 3:09 AM   I personally saw and evaluated the patient, performing the key elements of the service. I developed and verified the management plan that is described in the resident's/student's note, and I agree with the content with my edits above. HRR&R, Resp unlabored, Legs neg.  Nigel Berthold, CNM 08/07/2019 1:40 PM

## 2019-08-07 NOTE — Progress Notes (Signed)
Review of chart shows that patient has had mildly elevated blood pressures today; at this time patient denies any HA, blurry vision, RUQ pain, floating spots.  Patient Vitals for the past 24 hrs:  BP Temp Temp src Pulse Resp SpO2  08/07/19 1446 (!) 151/93 98.5 F (36.9 C) Oral 82 16 100 %  08/07/19 0944 (!) 145/91 97.9 F (36.6 C) Oral 70 16 100 %  08/07/19 0614 137/82 97.9 F (36.6 C) Oral 65 16 100 %  08/07/19 0245 (!) 133/95 97.9 F (36.6 C) Oral 65 18 100 %  08/06/19 2230 137/86 97.8 F (36.6 C) Oral 79 18 100 %  08/06/19 1857 (!) 148/85 98.1 F (36.7 C) Oral 73 16 100 %  08/06/19 1740 128/80 98 F (36.7 C) Oral 64 16 100 %  08/06/19 1728 129/77 98 F (36.7 C) Oral 66 18 100 %  08/06/19 1715 117/70 98 F (36.7 C) Axillary 65 16 100 %  08/06/19 1700 124/67 - - 67 16 100 %  08/06/19 1645 121/60 - - 70 17 99 %  08/06/19 1630 124/62 - - 73 18 99 %  08/06/19 1615 122/66 - - 73 18 99 %  08/06/19 1600 (!) 119/59 - - 78 16 99 %  08/06/19 1557 122/64 98 F (36.7 C) Oral 74 18 99 %   Plan: Start Norvasc 5 mg ; draw CBC and CMP to check Patient has BP check scheduled for next week.  Luna Kitchens

## 2019-08-08 ENCOUNTER — Ambulatory Visit: Payer: Self-pay

## 2019-08-08 MED ORDER — OXYCODONE HCL 5 MG PO TABS: 5.0000 mg | ORAL_TABLET | ORAL | 0 refills | Status: DC | PRN

## 2019-08-08 MED ORDER — AMLODIPINE BESYLATE 5 MG PO TABS: 5.0000 mg | ORAL_TABLET | Freq: Every day | ORAL | 0 refills | Status: DC

## 2019-08-08 MED ORDER — IBUPROFEN 600 MG PO TABS: 600.0000 mg | ORAL_TABLET | Freq: Four times a day (QID) | ORAL | 0 refills | Status: DC

## 2019-08-08 MED ORDER — SENNOSIDES-DOCUSATE SODIUM 8.6-50 MG PO TABS: 2.0000 | ORAL_TABLET | ORAL | 0 refills | Status: DC

## 2019-08-08 MED ORDER — ACETAMINOPHEN 325 MG PO TABS: 650.0000 mg | ORAL_TABLET | Freq: Four times a day (QID) | ORAL | 0 refills | Status: DC | PRN

## 2019-08-08 NOTE — Progress Notes (Signed)
Patient asked about pumping and giving babies breastmilk.  RN taught patient hand expression and set her up with a pump.  Patient voiced understanding of education on how to [place] the pump and had questions about a pump at home.  RN called lactation to come see the patient to discuss pump options and assist with a pumping plan.  Ana Woodroof, [place]

## 2019-08-08 NOTE — Lactation Note (Signed)
This note was copied from a baby's chart. Lactation Consultation Note  Patient Name: Crystal Hines Date: 08/08/2019 Reason for consult: Initial assessment;1st time breastfeeding;NICU baby;Late-preterm 34-36.6wks;Infant < 6lbs;Multiple gestation  LC in to visit with P3 Mom of LPT twins in the NICU.  Both baby's <5 lbs. Babies are 41 hrs old and transitioned from [facility] to the NICU for low temps and poor feeding.  Babies are bottle feeding and NG feeding formula currently.  Mom came in wanting to formula and breastfeed.  Babies latched after birth for a short feeding.  Mom has a 2 yr old at home that she didn't breastfeed.  Babies both started getting formula by bottle initially, Mom didn't start pumping until about 54 hrs post partum when her [facility] RN set up the pump for her.    Mom has a history of one + urine drug screen for cocaine during her pregnancy 11/20.  Mom denies cocaine use at all.  Mom states she stopped smoking marijuana when she found out she was pregnant.  Mom's urine drug screen was negative on admission, and both babies urine drug screens were negative.  Mom understands we are waiting on cord drug screen before babies can take Mom's EBM.    Breasts full and Mom pumped this am and expressed 120 ml.  Breasts heavy but not engorged.  Mom encouraged to pump both breasts every 2-3 hrs for 15-30 mins.  RN taught Mom how to do breast massage and hand expression.    Mom states she has Medicaid and WIC in [place].  Mom is interested in a [facility] loaner, paperwork given.  Mom leaving and will return with $30 deposit.  [facility] referral sent.  Mom given Lactation brochure and NICU booklet and understands the support she will receive IP and OP .  Mom very appreciative of support for her providing her breast milk to her babies.    Maternal Data Formula Feeding for Exclusion: Yes Reason for exclusion: Mother's choice to formula and breast feed on admission;Admission to  Intensive Care Unit (ICU) post-partum Has patient been taught Hand Expression?: Yes Does the patient have breastfeeding experience prior to this delivery?: No  Feeding Feeding Type: Formula Nipple Type: Nfant Slow Flow (purple)  Interventions Interventions: Breast feeding basics reviewed;Skin to skin;Breast massage;Hand express;DEBP;Expressed milk  Lactation Tools Discussed/Used Tools: Pump;Bottle Breast pump type: Double-Electric Breast Pump WIC Program: Yes Pump Review: Setup, frequency, and cleaning;Milk Storage Initiated by:: MB RN (Couplet care in NICU) Date initiated:: 08/08/19   Consult Status Consult Status: Follow-up Date: 08/08/19 Follow-up type: Call as needed    [name] 08/08/2019, 12:46 PM

## 2019-08-08 NOTE — Lactation Note (Signed)
This note was copied from a baby's chart. Lactation Consultation Note  Patient Name: Crystal Hines [id number] Date: 08/08/2019   [facility] loaner pump given with instructions.   [name] 08/08/2019, 5:34 PM

## 2019-08-10 LAB — SURGICAL PATHOLOGY

## 2019-08-12 ENCOUNTER — Other Ambulatory Visit: Payer: Self-pay

## 2019-08-12 ENCOUNTER — Ambulatory Visit: Payer: Self-pay

## 2019-08-12 ENCOUNTER — Telehealth (INDEPENDENT_AMBULATORY_CARE_PROVIDER_SITE_OTHER): Payer: Medicaid Other

## 2019-08-12 VITALS — BP 158/89 | HR 107

## 2019-08-12 DIAGNOSIS — Z0130 Encounter for examination of blood pressure without abnormal findings: Secondary | ICD-10-CM | POA: Insufficient documentation

## 2019-08-12 NOTE — Lactation Note (Signed)
This note was copied from a baby's chart. Lactation Consultation Note  Patient Name: Crystal Hines [id number] Date: 08/12/2019   LC attempting to see Mom.  Noted that umbilical cord drug screen was positive for cocaine. Mom was not in baby's room.  Symphony DEBP set up in room, and noted WIC pump in case sitting on floor.  NNP to speak to Mom about not using her EBM.   Will follow up with Mom.     [name] 08/12/2019, 2:27 PM

## 2019-08-12 NOTE — Progress Notes (Addendum)
   NURSE VISIT- BLOOD PRESSURE CHECK  SUBJECTIVE:  Crystal Hines is a 24 y.o. [phone] female video visit blood pressure check  OBJECTIVE:  BP (!) 158/89 (BP Location: Left Arm, Patient Position: Sitting, Cuff Size: Normal)   Pulse (!) 107   LMP 11/26/2018   Breastfeeding No }.  ASSESSMENT: Postpartum  blood pressure check  PLAN: Discussed with kimberly booker about blood pressure Recommendations:Increase Amlodipine 10 mg daily. Schedule video visit Friday March 5 for blood pressure check   Follow-up: in 4 weeks  If have any symptoms like headache or blurred vision call office or go [facility] Rennis Petty  08/12/2019 2:41 PM   Chart reviewed for nurse visit. Agree with plan of care. Denies ha, visual changes, ruq/epigastric pain, n/v.   Cheral Marker, CNM 08/13/2019 10:15 AM

## 2019-08-14 ENCOUNTER — Ambulatory Visit: Payer: Self-pay

## 2019-08-14 NOTE — Lactation Note (Signed)
This note was copied from a baby's chart. Lactation Consultation Note  Patient Name: Crystal Hines [id number] Date: 08/14/2019   [facility] in to speak with Mom of twins in the NICU.  Babies are 56 days old and AGA [redacted]w[redacted]d.    Mom was informed that her breastmilk would need to be drug tested before babies could be fed at the breast or given her EBM.  Mom wants her milk to be tested.  Mom states she hasn't pumped in a day or two.  She hasn't taken her [employer] loaner pump home as she states her personal pump arrived in the mail.  Explained the Symphony DEBP from [facility] is the strongest pump for establishing her milk supply.    Reviewed importance of pumping both breasts 15-20 mins 8-12 times per 24 hrs.  Symphony pump in room and pump parts available also.  Mom to return the [facility] pump to MAU to obtain her $30 deposit.  Mom's breasts are not engorged.  Mom encouraged to call lactation prn for questions or concerns.   [name] 08/14/2019, 3:03 PM

## 2019-08-21 ENCOUNTER — Other Ambulatory Visit: Payer: Self-pay

## 2019-08-21 ENCOUNTER — Telehealth (INDEPENDENT_AMBULATORY_CARE_PROVIDER_SITE_OTHER): Payer: Medicaid Other

## 2019-08-21 VITALS — BP 146/81 | HR 82

## 2019-08-21 DIAGNOSIS — Z0130 Encounter for examination of blood pressure without abnormal findings: Secondary | ICD-10-CM

## 2019-08-21 NOTE — Progress Notes (Signed)
   NURSE VISIT- BLOOD PRESSURE CHECK 71yr. Female for phone visit for blood pressure check  ASSESSMENT:   blood pressure check  PLAN: Discussed with Dr. Emelda Fear   Recommendations blood pressure better, keep taking Amlodipine 10 mg   Follow-up: as scheduled March 30 21 ppv   Rennis Petty  08/21/2019 11:33 AM

## 2019-09-08 ENCOUNTER — Other Ambulatory Visit: Payer: Self-pay

## 2019-09-08 ENCOUNTER — Encounter: Payer: Self-pay | Admitting: Women's Health

## 2019-09-08 ENCOUNTER — Ambulatory Visit (INDEPENDENT_AMBULATORY_CARE_PROVIDER_SITE_OTHER): Payer: Medicaid Other | Admitting: Women's Health

## 2019-09-08 ENCOUNTER — Other Ambulatory Visit (HOSPITAL_COMMUNITY)
Admission: RE | Admit: 2019-09-08 | Discharge: 2019-09-08 | Disposition: A | Discharge status: Home / Self Care | Payer: Medicaid Other | Source: Ambulatory Visit | Attending: Obstetrics & Gynecology | Admitting: Obstetrics & Gynecology

## 2019-09-08 DIAGNOSIS — Z124 Encounter for screening for malignant neoplasm of cervix: Secondary | ICD-10-CM | POA: Insufficient documentation

## 2019-09-08 DIAGNOSIS — O165 Unspecified maternal hypertension, complicating the puerperium: Secondary | ICD-10-CM

## 2019-09-08 DIAGNOSIS — Z8759 Personal history of other complications of pregnancy, childbirth and the puerperium: Secondary | ICD-10-CM

## 2019-09-08 DIAGNOSIS — Z9851 Tubal ligation status: Secondary | ICD-10-CM

## 2019-09-08 MED ORDER — HYDROCHLOROTHIAZIDE 25 MG PO TABS: 25.0000 mg | ORAL_TABLET | Freq: Every day | ORAL | 0 refills | Status: DC

## 2019-09-08 NOTE — Patient Instructions (Signed)
Keep taking norvasc 10mg  daily I have sent a new prescription for hydrochlorothiazide 25mg  daily for you to start today- stop taking it 2 days before you come back for your blood pressure check (but continue to take the norvasc 10mg )

## 2019-09-08 NOTE — Progress Notes (Signed)
POSTPARTUM VISIT Patient name: Crystal Hines MRN [id number]  Date of birth: 1996/05/11 Chief Complaint:   Postpartum Care  History of Present Illness:   Crystal Hines is a 24 y.o. [phone] African American female being seen today for a postpartum visit. She is 4 weeks postpartum following a spontaneous vaginal delivery of twins at 36.1 gestational weeks after IOL for GHTN, twins w/ discordant growth of twin A. Also had inpatient BTL. Anesthesia: epidural. Laceration: periurethral. I have fully reviewed the prenatal and intrapartum course. Pregnancy complicated by twin pregnancy, GHTN. 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 Took today around 1130. Felt like bp was high the other day, doesn't remember what it was when she checked it. Bleeding no bleeding. Bowel function is normal. Bladder function is normal.  Patient is not sexually active. Last sexual activity: prior to birth of babies.  Contraception method is tubal ligation.    Last pap 02/06/18.  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  No LMP recorded.  Baby's course has been complicated by brief NICU stay. Baby is feeding by bottle    Edinburgh Postpartum Depression Screening: negative Edinburgh Postnatal Depression Scale - 09/08/19 1343      Edinburgh Postnatal Depression Scale:  In the Past 7 Days   I have been able to laugh and see the funny side of things.  0    I have looked forward with enjoyment to things.  0    I have blamed myself unnecessarily when things went wrong.  0    I have been anxious or worried for no good reason.  0    I have felt scared or panicky for no good reason.  0    Things have been getting on top of me.  0    I have been so unhappy that I have had difficulty sleeping.  0    I have felt sad or miserable.  0    I have been so unhappy that I have been crying.  0    The thought of harming myself has  occurred to me.  0    Edinburgh Postnatal Depression Scale Total  0      Review of Systems:   Pertinent items are noted in HPI Denies Abnormal vaginal discharge w/ itching/odor/irritation, headaches, visual changes, shortness of breath, chest pain, abdominal pain, severe nausea/vomiting, or problems with urination or bowel movements. Pertinent History Reviewed:  Reviewed past medical,surgical, obstetrical and family history.  Reviewed problem list, medications and allergies. OB History  Gravida Para Term Preterm AB Living  3 2 1 1 1 3   SAB TAB Ectopic Multiple Live Births  1     1 3     # Outcome Date GA Lbr Len/2nd Weight Sex Delivery Anes PTL Lv  3A Preterm 08/06/19 [redacted]w[redacted]d / 00:13 4 lb 8.3 oz (2.05 kg) M Vag-Spont EPI  LIV  3B Preterm 08/06/19 [redacted]w[redacted]d / 00:22 4 lb 14.5 oz (2.225 kg) M Vag-Spont EPI  LIV  2 Term 12/01/16 [redacted]w[redacted]d 19:58 / 00:19 6 lb 6.5 oz (2.906 kg) F Vag-Spont EPI N LIV     Complications: Pregnancy induced hypertension  1 SAB            Physical Assessment:   Vitals:   09/08/19 1340 09/08/19 1420  BP: (!) 153/80 (!) 149/99  Pulse: (!) 219 80  Weight: 218 lb 9.6 oz (99.2  kg)   Body mass index is 33.24 kg/m.       Physical Examination:   General appearance: alert, well appearing, and in no distress  Mental status: alert, oriented to person, place, and time  Skin: warm & dry   Cardiovascular: normal heart rate noted   Respiratory: normal respiratory effort, no distress   Breasts: deferred, no complaints   Abdomen: soft, non-tender, small suture in umbilicus- snipped  Pelvic: VULVA: normal appearing vulva with no masses, tenderness or lesions, VAGINA: normal appearing vagina with normal color and discharge, no lesions, CERVIX: normal appearing cervix without discharge or lesions, pap obtained  Rectal: no hemorrhoids  Extremities: no edema       No results found for this or any previous visit (from the past 24 hour(s)).  Assessment & Plan:  1) Postpartum exam 2)  4 wks s/p SVB of twins after IOL @ 36wks d/t GHTN and discordant growth 3) Bottlefeeding 4) Depression screening 5) Contraception>  tubal ligation  6) PPHTN> continue norvasc 10mg , add hctz 25mg - stop it 2d before bp check in 2wks, keep taking norvasc 10mg  7) H/O abnormal pap> repeat done today  Meds:  Meds ordered this encounter  Medications  . hydrochlorothiazide (HYDRODIURIL) 25 MG tablet    Sig: Take 1 tablet (25 mg total) by mouth daily.    Dispense:  30 tablet    Refill:  0    Order Specific Question:   Supervising Provider    Answer:   [[id number]]    Follow-up: Return in about 2 weeks (around 09/22/2019) for nurse visit bp check.   No orders of the defined types were placed in this encounter.   CNM, [facility] 09/08/2019 2:21 PM

## 2019-09-10 LAB — CYTOLOGY - PAP: Diagnosis: NEGATIVE

## 2019-12-03 ENCOUNTER — Other Ambulatory Visit: Payer: Self-pay | Admitting: Family Medicine

## 2020-03-17 ENCOUNTER — Telehealth: Payer: Self-pay

## 2020-03-17 ENCOUNTER — Ambulatory Visit: Payer: BLUE CROSS/BLUE SHIELD | Admitting: Adult Health

## 2020-05-16 ENCOUNTER — Encounter: Payer: Self-pay | Admitting: General Practice

## 2021-09-12 ENCOUNTER — Encounter: Payer: Self-pay | Admitting: Orthopedic Surgery

## 2021-09-12 ENCOUNTER — Ambulatory Visit: Payer: Medicaid Other | Admitting: Orthopedic Surgery

## 2021-09-12 ENCOUNTER — Ambulatory Visit: Payer: Medicaid Other

## 2021-09-12 VITALS — BP 159/107 | HR 81 | Ht 68.0 in | Wt 248.0 lb

## 2021-09-12 DIAGNOSIS — M79672 Pain in left foot: Secondary | ICD-10-CM

## 2021-09-12 DIAGNOSIS — M722 Plantar fascial fibromatosis: Secondary | ICD-10-CM | POA: Diagnosis not present

## 2021-09-12 NOTE — Progress Notes (Signed)
New Patient Visit ? ?Assessment: ?Crystal Hines is a 26 y.o. female with the following: ?1. Plantar fasciitis of left foot ? ? ? ?Plan: ?Crystal Hines has pain and tenderness consistent with plantar fasciitis.  We discussed treatment with medications, home exercises and possible bracing.  She is in agreement with this plan.  We briefly discussed proceeding with an injection, which she is not interested.  If her pain persist, her neck step would be injection or formal physical therapy.  Follow-up as needed. ? ?Follow-up: ?Return if symptoms worsen or fail to improve. ? ?Subjective: ? ?Chief Complaint  ?Patient presents with  ? Foot Pain  ?  Lt foot pain for 4 months getting worse. Pt states she started working at [employer] 6 months ago.   ? ? ?History of Present Illness: ?Crystal Hines is a 26 y.o. female who presents for evaluation of left foot pain.  She states she has had pain in the plantar aspect of the left foot for the past 4 months.  She started working at [employer], approximately 6 months ago.  Pain is in the plantar aspect of her heel.  It gets worse in the morning.  Occasionally, it will throb.  She does not take any medications on a consistent basis.  Changing footwear, has not changed her symptoms.  No prior injuries to her left foot or ankle. ? ? ?Review of Systems: ?No fevers or chills ?No numbness or tingling ?No chest pain ?No shortness of breath ?No bowel or bladder dysfunction ?No GI distress ?No headaches ? ? ?Medical History: ? ?Past Medical History:  ?Diagnosis Date  ? Pregnancy induced hypertension   ? Pregnant 08/23/2015  ? Vaginal Pap smear, abnormal   ? ? ?Past Surgical History:  ?Procedure Laterality Date  ? NO PAST SURGERIES    ? TUBAL LIGATION Bilateral 08/06/2019  ? Procedure: POST PARTUM TUBAL LIGATION;  Surgeon: Catalina Antigua, MD;  Location: [facility] LD ORS;  Service: Gynecology;  Laterality: Bilateral;  ? tubaligation    ? ? ?Family History  ?Problem Relation Age of Onset  ?  Hypertension Mother   ? Hypertension Father   ? Other Father   ?     blood clots  ? Hypertension Maternal Grandmother   ? Hypertension Maternal Grandfather   ? Congestive Heart Failure Paternal Grandfather   ? ?Social History  ? ?Tobacco Use  ? Smoking status: Former  ?  Years: 2.00  ?  Types: Cigarettes  ? Smokeless tobacco: Never  ?Vaping Use  ? Vaping Use: Never used  ?Substance Use Topics  ? Alcohol use: Yes  ? Drug use: No  ? ? ?No Known Allergies ? ?No outpatient medications have been marked as taking for the 09/12/21 encounter (Office Visit) with Oliver Barre, MD.  ? ? ?Objective: ?BP (!) 159/107   Pulse 81   Ht 5\' 8"  (1.727 m)   Wt 248 lb (112.5 kg)   BMI 37.71 kg/m?  ? ?Physical Exam: ? ?General: Alert and oriented. and No acute distress. ?Gait: Left sided antalgic gait. ? ?Left foot without deformity or swelling.  Tenderness to palpation over the plantar heel.  She is barely able to get to a plantigrade position with her knee extended.  5 degrees dorsiflexion with her knee bent.  Toes are warm and well-perfused.  Sensation is intact over the dorsum of her foot. ? ?IMAGING: ?I personally ordered and reviewed the following images ? ?X-ray of the left foot demonstrates no acute injuries.  No  dislocations are appreciated.  Overall alignment remains normal. ? ?Impression: Normal left foot x-ray ? ?New Medications:  ?No orders of the defined types were placed in this encounter. ? ? ? ? ?Oliver Barre, MD ? ?09/12/2021 ?10:48 AM ? ? ?

## 2021-09-12 NOTE — Patient Instructions (Signed)
If not improving with home exercises and medicines, can consider formal PT or an injection  ?

## 2021-12-24 ENCOUNTER — Emergency Department (HOSPITAL_COMMUNITY)
Admission: EM | Admit: 2021-12-24 | Discharge: 2021-12-24 | Disposition: A | Discharge status: Home / Self Care | Payer: Medicaid Other | Attending: Emergency Medicine | Admitting: Emergency Medicine

## 2021-12-24 ENCOUNTER — Encounter (HOSPITAL_COMMUNITY): Payer: Self-pay

## 2021-12-24 ENCOUNTER — Other Ambulatory Visit: Payer: Self-pay

## 2021-12-24 DIAGNOSIS — R21 Rash and other nonspecific skin eruption: Secondary | ICD-10-CM | POA: Diagnosis present

## 2021-12-24 DIAGNOSIS — T7840XA Allergy, unspecified, initial encounter: Secondary | ICD-10-CM

## 2021-12-24 MED ORDER — EPINEPHRINE 0.3 MG/0.3ML IJ SOAJ
0.3000 mg | Freq: Once | INTRAMUSCULAR | Status: AC
  Administered 2021-12-24: 0.3 mg via INTRAMUSCULAR
  Filled 2021-12-24: qty 0.3

## 2021-12-24 MED ORDER — METHYLPREDNISOLONE SODIUM SUCC 125 MG IJ SOLR
125.0000 mg | Freq: Once | INTRAMUSCULAR | Status: AC
  Administered 2021-12-24: 125 mg via INTRAVENOUS
  Filled 2021-12-24: qty 2

## 2021-12-24 MED ORDER — PREDNISONE 20 MG PO TABS: ORAL_TABLET | ORAL | 0 refills | Status: DC

## 2021-12-24 MED ORDER — FAMOTIDINE 20 MG PO TABS: 20.0000 mg | ORAL_TABLET | Freq: Two times a day (BID) | ORAL | 0 refills | Status: DC

## 2021-12-24 MED ORDER — FAMOTIDINE IN NACL 20-0.9 MG/50ML-% IV SOLN
20.0000 mg | Freq: Once | INTRAVENOUS | Status: AC
  Administered 2021-12-24: 20 mg via INTRAVENOUS
  Filled 2021-12-24: qty 50

## 2021-12-24 MED ORDER — DIPHENHYDRAMINE HCL 50 MG/ML IJ SOLN
50.0000 mg | Freq: Once | INTRAMUSCULAR | Status: AC
  Administered 2021-12-24: 50 mg via INTRAVENOUS
  Filled 2021-12-24: qty 1

## 2021-12-24 NOTE — ED Triage Notes (Signed)
Pt presents to ED with complaints of whelps all over body, started this am, unsure what she was exposed to. Denies throat swelling or difficulty breathing.

## 2021-12-24 NOTE — ED Provider Notes (Signed)
[facility] EMERGENCY DEPARTMENT Provider Note   CSN: [id number] Arrival date & time: 12/24/21  0725     History  No chief complaint on file.   Crystal Hines is a 26 y.o. female.  Patient with history of obesity.  She complains of a rash to her abdomen and chest  The history is provided by the patient and medical records. No language interpreter was used.  Allergic Reaction Presenting symptoms: itching and rash   Severity:  Moderate Prior allergic episodes:  No prior episodes Context: not animal exposure   Relieved by:  Nothing Worsened by:  Nothing Ineffective treatments:  None tried      Home Medications Prior to Admission medications   Medication Sig Start Date End Date Taking? Authorizing Provider  famotidine (PEPCID) 20 MG tablet Take 1 tablet (20 mg total) by mouth 2 (two) times daily. 12/24/21  Yes Bethann Berkshire, MD  predniSONE (DELTASONE) 20 MG tablet 2 tabs po daily x 3 days 12/24/21  Yes Bethann Berkshire, MD      Allergies    Patient has no known allergies.    Review of Systems   Review of Systems  Constitutional:  Negative for appetite change and fatigue.  HENT:  Negative for congestion, ear discharge and sinus pressure.   Eyes:  Negative for discharge.  Respiratory:  Negative for cough.   Cardiovascular:  Negative for chest pain.  Gastrointestinal:  Negative for abdominal pain and diarrhea.  Genitourinary:  Negative for frequency and hematuria.  Musculoskeletal:  Negative for back pain.  Skin:  Positive for itching and rash.  Neurological:  Negative for seizures and headaches.  Psychiatric/Behavioral:  Negative for hallucinations.     Physical Exam Updated Vital Signs BP 129/71   Pulse 87   Temp 98.4 F (36.9 C) (Oral)   Resp 18   Ht 5\' 7"  (1.702 m)   Wt 104.3 kg   LMP 12/21/2021   SpO2 100%   BMI 36.02 kg/m  Physical Exam Vitals and nursing note reviewed.  Constitutional:      Appearance: She is well-developed.  HENT:     Head:  Normocephalic.     Nose: Nose normal.     Mouth/Throat:     Mouth: Mucous membranes are moist.  Eyes:     General: No scleral icterus.    Conjunctiva/sclera: Conjunctivae normal.  Neck:     Thyroid: No thyromegaly.  Cardiovascular:     Rate and Rhythm: Normal rate and regular rhythm.     Heart sounds: No murmur heard.    No friction rub. No gallop.  Pulmonary:     Breath sounds: No stridor. No wheezing or rales.  Chest:     Chest wall: No tenderness.  Abdominal:     General: There is no distension.     Tenderness: There is no abdominal tenderness. There is no rebound.  Musculoskeletal:        General: Normal range of motion.     Cervical back: Neck supple.  Lymphadenopathy:     Cervical: No cervical adenopathy.  Skin:    Findings: Rash present. No erythema.     Comments: Welts to abdomen and chest  Neurological:     Mental Status: She is alert and oriented to person, place, and time.     Motor: No abnormal muscle tone.     Coordination: Coordination normal.  Psychiatric:        Behavior: Behavior normal.     ED Results / Procedures / Treatments  Labs (all labs ordered are listed, but only abnormal results are displayed) Labs Reviewed - No data to display  EKG None  Radiology No results found.  Procedures Procedures    Medications Ordered in ED Medications  famotidine (PEPCID) IVPB 20 mg premix (0 mg Intravenous Stopped 12/24/21 0905)  diphenhydrAMINE (BENADRYL) injection 50 mg (50 mg Intravenous Given 12/24/21 0805)  methylPREDNISolone sodium succinate (SOLU-MEDROL) 125 mg/2 mL injection 125 mg (125 mg Intravenous Given 12/24/21 0807)  EPINEPHrine (EPI-PEN) injection 0.3 mg (0.3 mg Intramuscular Given 12/24/21 0802)    ED Course/ Medical Decision Making/ A&P                           Medical Decision Making Risk Prescription drug management.  This patient presents to the ED for concern of allergic reaction, this involves an extensive number of treatment  options, and is a complaint that carries with it a high risk of complications and morbidity.  The differential diagnosis includes allergic reaction   Co morbidities that complicate the patient evaluation  Obesity   Additional history obtained:  Additional history obtained from patient External records from outside source obtained and reviewed including hospital records   Lab Tests:  No lab  Imaging Studies ordered: No x-rays Cardiac Monitoring: / EKG:  The patient was maintained on a cardiac monitor.  I personally viewed and interpreted the cardiac monitored which showed an underlying rhythm of: Normal sinus rhythm   Consultations Obtained: No consult Problem List / ED Course / Critical interventions / Medication management  Allergic reaction I ordered medication including Benadryl, Pepcid, epinephrine, Solu-Medrol for allergic reaction Reevaluation of the patient after these medicines showed that the patient improved I have reviewed the patients home medicines and have made adjustments as needed   Social Determinants of Health:  None   Test / Admission - Considered:  None  Patient with allergic reaction.  She improved with treatment and will be discharged home with prednisone and Pepcid and Benadryl        Final Clinical Impression(s) / ED Diagnoses Final diagnoses:  Allergic reaction, initial encounter    Rx / DC Orders ED Discharge Orders          Ordered    famotidine (PEPCID) 20 MG tablet  2 times daily        12/24/21 0923    predniSONE (DELTASONE) 20 MG tablet        12/24/21 4696              Bethann Berkshire, MD 12/24/21 1655

## 2021-12-24 NOTE — Discharge Instructions (Signed)
Take Benadryl for the itching.  Follow-up next week if not improving

## 2022-01-02 ENCOUNTER — Ambulatory Visit: Payer: Medicaid Other | Admitting: Women's Health

## 2022-01-15 ENCOUNTER — Encounter: Payer: Self-pay | Admitting: Women's Health

## 2022-01-15 ENCOUNTER — Other Ambulatory Visit (HOSPITAL_COMMUNITY)
Admission: RE | Admit: 2022-01-15 | Discharge: 2022-01-15 | Disposition: A | Discharge status: Home / Self Care | Payer: Medicaid Other | Source: Ambulatory Visit | Attending: Women's Health | Admitting: Women's Health

## 2022-01-15 ENCOUNTER — Ambulatory Visit (INDEPENDENT_AMBULATORY_CARE_PROVIDER_SITE_OTHER): Payer: Medicaid Other | Admitting: Women's Health

## 2022-01-15 VITALS — BP 135/79 | HR 80 | Ht 67.0 in | Wt 245.0 lb

## 2022-01-15 DIAGNOSIS — Z01419 Encounter for gynecological examination (general) (routine) without abnormal findings: Secondary | ICD-10-CM

## 2022-01-15 DIAGNOSIS — N926 Irregular menstruation, unspecified: Secondary | ICD-10-CM

## 2022-01-15 DIAGNOSIS — Z0000 Encounter for general adult medical examination without abnormal findings: Secondary | ICD-10-CM | POA: Diagnosis not present

## 2022-01-15 NOTE — Progress Notes (Signed)
WELL-WOMAN EXAMINATION Patient name: Crystal Hines MRN [id number]  Date of birth: 15-Aug-1995 Chief Complaint:   Annual Exam (Discuss periods-getting heavier and lasting longer)  History of Present Illness:   Crystal Hines is a 26 y.o. [phone] African-American female being seen today for a routine well-woman exam.  Current complaints: heavy prolonged periods x . Denies abnormal discharge, itching/odor/irritation.  No change in sex partners. Periods regular, last 2 wks, changes saturated pad q1hr at heaviest. Some clots.   PCP: none      does not desire labs Patient's last menstrual period was 12/21/2021. The current method of family planning is tubal ligation.  Last pap 09/08/19. Results were: NILM w/ HRHPV not done. H/O abnormal pap: yes Last mammogram:  never. Results were: N/A. Family h/o breast cancer: no Last colonoscopy: never. Results were: N/A. Family h/o colorectal cancer: yes MGF     01/15/2022    9:52 AM 09/08/2019    1:43 PM 02/23/2019    2:32 PM 05/07/2016    1:44 PM  Depression screen PHQ 2/9  Decreased Interest 3 0 0 0  Down, Depressed, Hopeless 3 0 0 0  PHQ - 2 Score 6 0 0 0  Altered sleeping 3 0 0   Tired, decreased energy 3 0 1   Change in appetite 3 0 1   Feeling bad or failure about yourself  3 0 0   Trouble concentrating 3 0 0   Moving slowly or fidgety/restless 3 0 0   Suicidal thoughts 0 0 0   PHQ-9 Score 24 0 2   Difficult doing work/chores  Not difficult at all          01/15/2022    9:52 AM 09/08/2019    1:43 PM  GAD 7 : Generalized Anxiety Score  Nervous, Anxious, on Edge 3 0  Control/stop worrying 3 0  Worry too much - different things 3 0  Trouble relaxing 3 0  Restless 3 0  Easily annoyed or irritable 3 0  Afraid - awful might happen 0 0  Total GAD 7 Score 18 0  Anxiety Difficulty  Not difficult at all     Review of Systems:   Pertinent items are noted in HPI Denies any headaches, blurred vision, fatigue, shortness of breath,  chest pain, abdominal pain, abnormal vaginal discharge/itching/odor/irritation, problems with periods, bowel movements, urination, or intercourse unless otherwise stated above. Pertinent History Reviewed:  Reviewed past medical,surgical, social and family history.  Reviewed problem list, medications and allergies. Physical Assessment:   Vitals:   01/15/22 0949  BP: 135/79  Pulse: 80  Weight: 245 lb (111.1 kg)  Height: 5\' 7"  (1.702 m)  Body mass index is 38.37 kg/m.        Physical Examination:   General appearance - well appearing, and in no distress  Mental status - alert, oriented to person, place, and time  Psych:  She has a normal mood and affect  Skin - warm and dry, normal color, no suspicious lesions noted  Chest - effort normal, all lung fields clear to auscultation bilaterally  Heart - normal rate and regular rhythm  Neck:  midline trachea, no thyromegaly or nodules  Breasts - breasts appear normal, no suspicious masses, no skin or nipple changes or  axillary nodes  Abdomen - soft, nontender, nondistended, no masses or organomegaly  Pelvic - VULVA: normal appearing vulva with no masses, tenderness or lesions  VAGINA: normal appearing vagina with normal color and discharge, no lesions  CERVIX:  normal appearing cervix without discharge or lesions, no CMT, CV swab collected  Thin prep pap is not done   UTERUS: uterus is felt to be normal size, shape, consistency and nontender   ADNEXA: No adnexal masses or tenderness noted.  Extremities:  No swelling or varicosities noted  Chaperone: Latisha Cresenzo    No results found for this or any previous visit (from the past 24 hour(s)).  Assessment & Plan:  1) Well-Woman Exam  2) Heavy prolonged periods x 74mths> CV swab, if neg wants Mirena IUD  Labs/procedures today: CV swab  Mammogram: @ 27yo, or sooner if problems Colonoscopy: @ 27yo, or sooner if problems  No orders of the defined types were placed in this  encounter.   Meds: No orders of the defined types were placed in this encounter.   Follow-up: Return in about 1 year (around 01/16/2023) for Physical.  Cheral Marker CNM, WHNP-BC 01/15/2022 10:30 AM

## 2022-01-16 LAB — CERVICOVAGINAL ANCILLARY ONLY
Bacterial Vaginitis (gardnerella): POSITIVE — AB
Candida Glabrata: NEGATIVE
Candida Vaginitis: NEGATIVE
Chlamydia: NEGATIVE
Comment: NEGATIVE
Comment: NEGATIVE
Comment: NEGATIVE
Comment: NEGATIVE
Comment: NEGATIVE
Comment: NORMAL
Neisseria Gonorrhea: NEGATIVE
Trichomonas: NEGATIVE

## 2022-01-16 MED ORDER — METRONIDAZOLE 500 MG PO TABS: 500.0000 mg | ORAL_TABLET | Freq: Two times a day (BID) | ORAL | 0 refills | Status: DC

## 2022-01-16 NOTE — Addendum Note (Signed)
Addended by: Shawna Clamp R on: 01/16/2022 12:14 PM   Modules accepted: Orders

## 2022-04-10 ENCOUNTER — Ambulatory Visit: Payer: Medicaid Other | Admitting: Women's Health

## 2022-04-13 ENCOUNTER — Emergency Department (HOSPITAL_COMMUNITY)
Admission: EM | Admit: 2022-04-13 | Discharge: 2022-04-13 | Disposition: A | Discharge status: Home / Self Care | Payer: Medicaid Other | Attending: Emergency Medicine | Admitting: Emergency Medicine

## 2022-04-13 DIAGNOSIS — R519 Headache, unspecified: Secondary | ICD-10-CM | POA: Insufficient documentation

## 2022-04-13 MED ORDER — DIPHENHYDRAMINE HCL 25 MG PO CAPS
25.0000 mg | ORAL_CAPSULE | Freq: Once | ORAL | Status: AC
  Administered 2022-04-13: 25 mg via ORAL
  Filled 2022-04-13: qty 1

## 2022-04-13 MED ORDER — KETOROLAC TROMETHAMINE 60 MG/2ML IM SOLN
60.0000 mg | Freq: Once | INTRAMUSCULAR | Status: AC
  Administered 2022-04-13: 60 mg via INTRAMUSCULAR
  Filled 2022-04-13: qty 2

## 2022-04-13 MED ORDER — METOCLOPRAMIDE HCL 5 MG/ML IJ SOLN
10.0000 mg | Freq: Once | INTRAMUSCULAR | Status: AC
  Administered 2022-04-13: 10 mg via INTRAMUSCULAR
  Filled 2022-04-13: qty 2

## 2022-04-13 NOTE — ED Provider Notes (Signed)
[facility] EMERGENCY DEPARTMENT Provider Note   CSN: [id number] Arrival date & time: 04/13/22  1138     History {Add pertinent medical, surgical, social history, OB history to HPI:1} Chief Complaint  Patient presents with   Headache    Crystal Hines is a 26 y.o. female.   Headache Associated symptoms: no abdominal pain, no congestion, no cough, no fever, no nausea, no neck pain, no neck stiffness, no numbness, no sore throat, no vomiting and no weakness        Crystal Hines is a 26 y.o. female who presents to the Emergency Department complaining of right-sided headache intermittently x2 months, headache worse since last evening.  States the headache woke her up from sleep.  She typically takes [place] powders with relief of her headache.  Did not take any medication last evening or today stating that her mother told her to stop using [place]y powders as it may upset her stomach.  Describes headache as a throbbing sensation behind her right eye.  Currently, headache is improved since last evening.  She denies any nausea, vomiting, neck pain or stiffness, recent illness, fever or chills.  Current headache is similar to previous headaches.   Home Medications Prior to Admission medications   Medication Sig Start Date End Date Taking? Authorizing Provider  famotidine (PEPCID) 20 MG tablet Take 1 tablet (20 mg total) by mouth 2 (two) times daily. Patient not taking: Reported on 01/15/2022 12/24/21   Milton Ferguson, MD  metroNIDAZOLE (FLAGYL) 500 MG tablet Take 1 tablet (500 mg total) by mouth 2 (two) times daily. 01/16/22   Roma Schanz, CNM  Multiple Vitamin (MULTIVITAMIN) tablet Take 1 tablet by mouth daily.    [provider]      Allergies    Patient has no known allergies.    Review of Systems   Review of Systems  Constitutional:  Negative for appetite change, chills and fever.  HENT:  Negative for congestion and sore throat.   Eyes:  Negative for visual  disturbance.  Respiratory:  Negative for cough and shortness of breath.   Cardiovascular:  Negative for chest pain.  Gastrointestinal:  Negative for abdominal pain, nausea and vomiting.  Musculoskeletal:  Negative for neck pain and neck stiffness.  Neurological:  Positive for headaches. Negative for syncope, speech difficulty, weakness, light-headedness and numbness.    Physical Exam Updated Vital Signs BP (!) 147/87   Pulse 96   Temp 98.2 F (36.8 C) (Oral)   Resp 18   Ht 5\' 7"  (1.702 m)   Wt 104.3 kg   LMP 04/09/2022   SpO2 100%   BMI 36.02 kg/m  Physical Exam Vitals and nursing note reviewed.  Constitutional:      General: She is not in acute distress.    Appearance: She is well-developed. She is not ill-appearing, toxic-appearing or diaphoretic.  HENT:     Head: Normocephalic.  Eyes:     Extraocular Movements: Extraocular movements intact.     Pupils: Pupils are equal, round, and reactive to light.  Neck:     Meningeal: Kernig's sign absent.  Cardiovascular:     Rate and Rhythm: Normal rate and regular rhythm.  Pulmonary:     Effort: Pulmonary effort is normal.     Breath sounds: Normal breath sounds.  Abdominal:     Palpations: Abdomen is soft.     Tenderness: There is no abdominal tenderness.  Musculoskeletal:        General: Normal range of motion.  Cervical back: Normal range of motion.  Lymphadenopathy:     Cervical: No cervical adenopathy.  Skin:    General: Skin is warm.     Findings: No rash.  Neurological:     General: No focal deficit present.     Mental Status: She is alert.     Sensory: Sensation is intact. No sensory deficit.     Motor: Motor function is intact. No weakness.     Coordination: Coordination is intact.     Comments: CN II through XII grossly intact.  Speech clear.  No facial droop.       Crystal Results / Procedures / Treatments   Labs (all labs ordered are listed, but only abnormal results are displayed) Labs Reviewed - No  data to display  EKG None  Radiology No results found.  Procedures Procedures  {Document cardiac monitor, telemetry assessment procedure when appropriate:1}  Medications Ordered in Crystal Medications - No data to display  Crystal Course/ Medical Decision Making/ A&P                           Medical Decision Making Patient here for evaluation of headache.  Reports intermittent headaches x2 months, headache became worse last evening.  Has not tried any over-the-counter medications for symptomatic relief.  She does state that she typically gets relief from [place] powders and drinking a caffeinated soda, but was advised by her mother to discontinue this practice.  She does note recent menses and family history of migraine headaches.  On my exam, patient well-appearing nontoxic.  No focal neurodeficits.  No nuchal rigidity.  I suspect this is recurrent headache, possibly migraine.  Doubt cluster headaches.  No concerning symptoms for meningitis or SAH.     {Document critical care time when appropriate:1} {Document review of labs and clinical decision tools ie heart score, Chads2Vasc2 etc:1}  {Document your independent review of radiology images, and any outside records:1} {Document your discussion with family members, caretakers, and with consultants:1} {Document social determinants of health affecting pt's care:1} {Document your decision making why or why not admission, treatments were needed:1} Final Clinical Impression(s) / Crystal Diagnoses Final diagnoses:  None    Rx / DC Orders Crystal Discharge Orders     None

## 2022-04-13 NOTE — Discharge Instructions (Signed)
Please follow-up with your primary care provider for recheck, return emergency department for any new or worsening symptoms.

## 2022-04-13 NOTE — ED Triage Notes (Addendum)
Pt to ED c/o intermittent HA x 2 months, reports relief with OTC medications and caffeine. No acute distress noted in triage. A&O X 4. Reports use to be on bp medications but stopped 1 year ago.

## 2022-04-24 ENCOUNTER — Ambulatory Visit: Payer: Medicaid Other | Admitting: Internal Medicine

## 2022-05-01 ENCOUNTER — Encounter: Payer: Self-pay | Admitting: Internal Medicine

## 2022-05-01 ENCOUNTER — Ambulatory Visit (INDEPENDENT_AMBULATORY_CARE_PROVIDER_SITE_OTHER): Payer: Medicaid Other | Admitting: Internal Medicine

## 2022-05-01 DIAGNOSIS — R519 Headache, unspecified: Secondary | ICD-10-CM

## 2022-05-01 DIAGNOSIS — E669 Obesity, unspecified: Secondary | ICD-10-CM | POA: Diagnosis not present

## 2022-05-01 DIAGNOSIS — G43719 Chronic migraine without aura, intractable, without status migrainosus: Secondary | ICD-10-CM | POA: Diagnosis not present

## 2022-05-01 DIAGNOSIS — E6609 Other obesity due to excess calories: Secondary | ICD-10-CM | POA: Diagnosis not present

## 2022-05-01 DIAGNOSIS — G43909 Migraine, unspecified, not intractable, without status migrainosus: Secondary | ICD-10-CM | POA: Insufficient documentation

## 2022-05-01 MED ORDER — SUMATRIPTAN 20 MG/ACT NA SOLN: 20.0000 mg | NASAL | 1 refills | Status: DC | PRN

## 2022-05-01 NOTE — Progress Notes (Signed)
CC: Establish Care (Migraines/headache. Ibuprofen used to help them but not helping a lot anymore. Will get random headaches that started becoming more frequently about 3 months ago. Now she gets them on a daily basis )     HPI:Crystal Hines is a 26 y.o. female who presents for evaluation of he. For the details of today's visit, please refer to the assessment and plan.  Past Medical History:  Diagnosis Date   Pregnancy induced hypertension    Pregnant 08/23/2015   Vaginal Pap smear, abnormal     Past Surgical History:  Procedure Laterality Date   NO PAST SURGERIES     TUBAL LIGATION Bilateral 08/06/2019   Procedure: POST PARTUM TUBAL LIGATION;  Surgeon: Catalina Antigua, MD;  Location: [facility] ORS;  Service: Gynecology;  Laterality: Bilateral;   tubaligation      Family History  Problem Relation Age of Onset   Hypertension Mother    Hypertension Father    Other Father        blood clots   Hypertension Maternal Grandmother    Hypertension Maternal Grandfather    Congestive Heart Failure Paternal Grandfather     Social History   Tobacco Use   Smoking status: Former    Years: 2.00    Types: Cigarettes   Smokeless tobacco: Never  Vaping Use   Vaping Use: Never used  Substance Use Topics   Alcohol use: Yes    Comment: every other weekend   Drug use: Yes    Types: Marijuana   Physical Exam: There were no vitals filed for this visit.   Physical Exam Constitutional:      General: She is not in acute distress.    Appearance: She is well-developed and well-groomed. She is obese.  HENT:     Head: Normocephalic and atraumatic.     Right Ear: External ear normal.     Left Ear: External ear normal.     Nose: No congestion or rhinorrhea.     Mouth/Throat:     Mouth: Mucous membranes are moist.     Pharynx: No oropharyngeal exudate or posterior oropharyngeal erythema.  Eyes:     General: No scleral icterus.    Extraocular Movements: Extraocular movements  intact.     Conjunctiva/sclera: Conjunctivae normal.  Cardiovascular:     Rate and Rhythm: Normal rate and regular rhythm.     Heart sounds: No murmur heard.    No friction rub. No gallop.  Pulmonary:     Effort: Pulmonary effort is normal.     Breath sounds: No wheezing, rhonchi or rales.  Musculoskeletal:        General: No swelling or tenderness.     Cervical back: No rigidity.  Lymphadenopathy:     Cervical: No cervical adenopathy.  Skin:    General: Skin is warm and dry.     Coloration: Skin is not jaundiced.     Findings: No rash.  Psychiatric:        Mood and Affect: Mood normal.        Behavior: Behavior normal.        Assessment & Plan:   Migraines First started having headaches 3 months ago. Headache is described as pounding like a heart beat. It is bilateral and wraps around her head. She feels nausea with headaches. She has taken ibuprofen and BC powders daily. Headaches come back in 2 hours. She is having headaches daily for 3 months. No changes in vision or neurologic deficits.  Assessment/Plan: Migraines, uncontrolled. Will prescribed abortive medication. Discussed potential of rebound headache from overuse of NSAID, try to limit to less than 15 days a month. Follow up in 3 months or sooner if headaches are not reduced or worsening in frequency.  - SUMAtriptan (IMITREX) 20 MG/ACT nasal spray; Place 1 spray (20 mg total) into the nose every 2 (two) hours as needed for migraine or headache. May repeat in 2 hours if headache persists or recurs.  Dispense: 6 each; Refill: 1   Obesity Patient has gain 24 pounds in last 3 months. She had her last pregnancy in 2021. We discussed portion control and increasing activity level. Patient is motivated and would like to start exercise program. Check TSH and hemoglobin A1C today. Referral to  Provider Referral Exercise Program (P.R.E.P). Follow up in 3 months.      Crystal Dy, MD

## 2022-05-01 NOTE — Assessment & Plan Note (Addendum)
First started having headaches 3 months ago. Headache is described as pounding like a heart beat. It is bilateral and wraps around her head. She feels nausea with headaches. She has taken ibuprofen and BC powders daily. Headaches come back in 2 hours. She is having headaches daily for 3 months. No changes in vision or neurologic deficits.   Assessment/Plan: Migraines, uncontrolled. Will prescribed abortive medication. Discussed potential of rebound headache from overuse of NSAID, try to limit to less than 15 days a month. Follow up in 3 months or sooner if headaches are not reduced or worsening in frequency.  - SUMAtriptan (IMITREX) 20 MG/ACT nasal spray; Place 1 spray (20 mg total) into the nose every 2 (two) hours as needed for migraine or headache. May repeat in 2 hours if headache persists or recurs.  Dispense: 6 each; Refill: 1

## 2022-05-01 NOTE — Patient Instructions (Addendum)
  Thank you for trusting me with your care. To recap, today we discussed the following:  Weight gain - TSH - Hemoglobin A1C - Amb Referral To Provider Referral Exercise Program (P.R.E.P)  Headaches  - BMP8+EGFR - SUMAtriptan (IMITREX) 20 MG/ACT nasal spray; Place 1 spray (20 mg total) into the nose every 2 (two) hours as needed for migraine or headache. May repeat in 2 hours if headache persists or recurs.  Dispense: 6 each; Refill: 1

## 2022-05-02 LAB — BMP8+EGFR
BUN/Creatinine Ratio: 12 (ref 9–23)
BUN: 10 mg/dL (ref 6–20)
CO2: 22 mmol/L (ref 20–29)
Calcium: 9 mg/dL (ref 8.7–10.2)
Chloride: 103 mmol/L (ref 96–106)
Creatinine, Ser: 0.82 mg/dL (ref 0.57–1.00)
Glucose: 78 mg/dL (ref 70–99)
Potassium: 4.2 mmol/L (ref 3.5–5.2)
Sodium: 138 mmol/L (ref 134–144)
eGFR: 100 mL/min/{1.73_m2} (ref >59)

## 2022-05-02 LAB — TSH: TSH: 1.27 u[IU]/mL (ref 0.450–4.500)

## 2022-05-02 LAB — HEMOGLOBIN A1C
Est. average glucose Bld gHb Est-mCnc: 105 mg/dL
Hgb A1c MFr Bld: 5.3 % (ref 4.8–5.6)

## 2022-05-04 DIAGNOSIS — E669 Obesity, unspecified: Secondary | ICD-10-CM | POA: Insufficient documentation

## 2022-05-04 NOTE — Assessment & Plan Note (Signed)
Patient has gain 24 pounds in last 3 months. She had her last pregnancy in 2021. We discussed portion control and increasing activity level. Patient is motivated and would like to start exercise program. Check TSH and hemoglobin A1C today. Referral to  Provider Referral Exercise Program (P.R.E.P). Follow up in 3 months.

## 2022-05-18 ENCOUNTER — Ambulatory Visit: Payer: Medicaid Other | Admitting: Internal Medicine

## 2022-08-01 ENCOUNTER — Encounter: Payer: Self-pay | Admitting: Internal Medicine

## 2022-08-01 ENCOUNTER — Ambulatory Visit (INDEPENDENT_AMBULATORY_CARE_PROVIDER_SITE_OTHER): Payer: Medicaid Other | Admitting: Internal Medicine

## 2022-08-01 VITALS — BP 133/83 | HR 88 | Ht 67.0 in | Wt 243.0 lb

## 2022-08-01 DIAGNOSIS — G43719 Chronic migraine without aura, intractable, without status migrainosus: Secondary | ICD-10-CM | POA: Diagnosis not present

## 2022-08-01 MED ORDER — SUMATRIPTAN SUCCINATE 50 MG PO TABS: 50.0000 mg | ORAL_TABLET | ORAL | 0 refills | Status: DC | PRN

## 2022-08-01 MED ORDER — TOPIRAMATE 50 MG PO TABS: 25.0000 mg | ORAL_TABLET | Freq: Two times a day (BID) | ORAL | 1 refills | Status: DC

## 2022-08-01 NOTE — Patient Instructions (Signed)
It was a pleasure to see you today.  Thank you for giving [place] the opportunity to be involved in your care.  Below is a brief recap of your visit and next steps.  We will plan to see you again in 6 months.  Summary Start Topamax 25 mg twice daily. Ok to increase to 50 mg twice daily after one week if not effective I have prescribed sumatriptan tablets for as needed headache relief. Follow up in 6 months

## 2022-08-01 NOTE — Progress Notes (Signed)
Established Patient Office Visit  Subjective   Patient ID: Crystal Hines, female    DOB: 1995-10-24  Age: 27 y.o. MRN: [id number]  Chief Complaint  Patient presents with   Headache    Follow up. Headaches still not getting any better   Crystal Hines returns to care today for routine follow-up.  She was last seen at [facility] on 05/01/22 by Dr. Court Joy as a new patient presenting to establish care.  She endorsed migraine headaches at that time and Imitrex was prescribed for abortive therapy.  There have been no acute interval events. Crystal Hines continues ot endorse migraine headaches today. She states that headaches occur on average 5 days per week. She endorses associated photophobia and nausea.  Crystal Hines has most recently been taking Fioricet for symptom relief, which seems to alleviate her symptoms for 2 hours before her headaches return.  She states that Imitrex was ineffective and caused nausea.  She does not believe that it was the medication itself but the act of shooting the medication into her nose.  She is interested in additional treatment options today.  Past Medical History:  Diagnosis Date   Pregnancy induced hypertension    Pregnant 08/23/2015   Vaginal Pap smear, abnormal    Past Surgical History:  Procedure Laterality Date   NO PAST SURGERIES     TUBAL LIGATION Bilateral 08/06/2019   Procedure: POST PARTUM TUBAL LIGATION;  Surgeon: Mora Bellman, MD;  Location: [place] LD ORS;  Service: Gynecology;  Laterality: Bilateral;   tubaligation     Social History   Tobacco Use   Smoking status: Former    Years: 2.00    Types: Cigarettes   Smokeless tobacco: Never  Vaping Use   Vaping Use: Never used  Substance Use Topics   Alcohol use: Yes    Comment: every other weekend   Drug use: Yes    Types: Marijuana   Family History  Problem Relation Age of Onset   Hypertension Mother    Hypertension Father    Other Father        blood clots   Hypertension Maternal  Grandmother    Hypertension Maternal Grandfather    Congestive Heart Failure Paternal Grandfather    No Known Allergies  Review of Systems  Gastrointestinal:  Positive for nausea.  Neurological:  Positive for headaches.       Photophobia with headaches  All other systems reviewed and are negative.    Objective:     BP 133/83   Pulse 88   Ht 5' 7"$  (1.702 m)   Wt 243 lb (110.2 kg)   SpO2 99%   BMI 38.06 kg/m  BP Readings from Last 3 Encounters:  08/01/22 133/83  04/13/22 (!) 147/87  01/15/22 135/79   Physical Exam Vitals reviewed.  Constitutional:      General: She is not in acute distress.    Appearance: Normal appearance. She is well-developed. She is obese. She is not toxic-appearing.  HENT:     Head: Normocephalic and atraumatic.     Right Ear: External ear normal.     Left Ear: External ear normal.     Nose: Nose normal. No congestion or rhinorrhea.     Mouth/Throat:     Mouth: Mucous membranes are moist.     Pharynx: Oropharynx is clear. No oropharyngeal exudate or posterior oropharyngeal erythema.  Eyes:     General: No scleral icterus.    Extraocular Movements: Extraocular movements intact.     Conjunctiva/sclera:  Conjunctivae normal.     Pupils: Pupils are equal, round, and reactive to light.  Cardiovascular:     Rate and Rhythm: Normal rate and regular rhythm.     Pulses: Normal pulses.     Heart sounds: Normal heart sounds. No murmur heard.    No friction rub. No gallop.  Pulmonary:     Effort: Pulmonary effort is normal.     Breath sounds: Normal breath sounds. No wheezing, rhonchi or rales.  Abdominal:     General: Abdomen is flat. Bowel sounds are normal. There is no distension.     Palpations: Abdomen is soft.     Tenderness: There is no abdominal tenderness.  Musculoskeletal:        General: No swelling. Normal range of motion.     Cervical back: Normal range of motion.     Right lower leg: No edema.     Left lower leg: No edema.   Lymphadenopathy:     Cervical: No cervical adenopathy.  Skin:    General: Skin is warm and dry.     Capillary Refill: Capillary refill takes less than 2 seconds.     Coloration: Skin is not jaundiced.  Neurological:     General: No focal deficit present.     Mental Status: She is alert and oriented to person, place, and time.  Psychiatric:        Mood and Affect: Mood normal.        Behavior: Behavior normal.   Last CBC Lab Results  Component Value Date   WBC 9.3 08/07/2019   HGB 9.8 (L) 08/07/2019   HCT 32.1 (L) 08/07/2019   MCV 77.7 (L) 08/07/2019   MCH 23.7 (L) 08/07/2019   RDW 15.3 08/07/2019   PLT 243 0000000   Last metabolic panel Lab Results  Component Value Date   GLUCOSE 78 05/01/2022   NA 138 05/01/2022   K 4.2 05/01/2022   CL 103 05/01/2022   CO2 22 05/01/2022   BUN 10 05/01/2022   CREATININE 0.82 05/01/2022   EGFR 100 05/01/2022   CALCIUM 9.0 05/01/2022   PROT 6.2 (L) 08/07/2019   ALBUMIN 2.7 (L) 08/07/2019   LABGLOB 3.3 02/23/2019   AGRATIO 1.4 02/23/2019   BILITOT 0.6 08/07/2019   ALKPHOS 94 08/07/2019   AST 28 08/07/2019   ALT 23 08/07/2019   ANIONGAP 9 08/07/2019   Last hemoglobin A1c Lab Results  Component Value Date   HGBA1C 5.3 05/01/2022   Last thyroid functions Lab Results  Component Value Date   TSH 1.270 05/01/2022     Assessment & Plan:   Problem List Items Addressed This Visit       Migraines - Primary    Returning to care today for follow-up of migraine headaches.  She was previously prescribed Imitrex nasal spray, which was ineffective due to side effects of nausea.  She endorses headaches roughly 5 days/week that are associated with photophobia and nausea. -Start Topamax 25 mg twice daily for migraine prophylaxis.  She was instructed to increase to 50 mg twice daily after 1 week if this dose is ineffective. -I have also prescribed Imitrex 50 mg tablets for as needed abortive therapy -We will tentatively plan for  follow-up in 6 months, however she will return to care before this date if today's medications do not improve her symptoms.      Return in about 6 months (around 01/30/2023).   Johnette Abraham, MD

## 2022-08-01 NOTE — Assessment & Plan Note (Signed)
Returning to care today for follow-up of migraine headaches.  She was previously prescribed Imitrex nasal spray, which was ineffective due to side effects of nausea.  She endorses headaches roughly 5 days/week that are associated with photophobia and nausea. -Start Topamax 25 mg twice daily for migraine prophylaxis.  She was instructed to increase to 50 mg twice daily after 1 week if this dose is ineffective. -I have also prescribed Imitrex 50 mg tablets for as needed abortive therapy -We will tentatively plan for follow-up in 6 months, however she will return to care before this date if today's medications do not improve her symptoms.

## 2022-08-09 ENCOUNTER — Encounter: Payer: Self-pay | Admitting: Radiology

## 2022-09-15 ENCOUNTER — Other Ambulatory Visit: Payer: Self-pay | Admitting: Internal Medicine

## 2022-09-15 DIAGNOSIS — G43719 Chronic migraine without aura, intractable, without status migrainosus: Secondary | ICD-10-CM

## 2022-09-17 MED ORDER — SUMATRIPTAN SUCCINATE 50 MG PO TABS
50.0000 mg | ORAL_TABLET | ORAL | 0 refills | Status: DC | PRN
Start: 2022-09-17 — End: 2023-01-30

## 2023-01-30 ENCOUNTER — Encounter: Payer: Self-pay | Admitting: Internal Medicine

## 2023-01-30 ENCOUNTER — Ambulatory Visit (INDEPENDENT_AMBULATORY_CARE_PROVIDER_SITE_OTHER): Payer: Medicaid Other | Admitting: Internal Medicine

## 2023-01-30 VITALS — BP 138/78 | HR 82 | Ht 67.0 in | Wt 256.8 lb

## 2023-01-30 DIAGNOSIS — G43719 Chronic migraine without aura, intractable, without status migrainosus: Secondary | ICD-10-CM

## 2023-01-30 MED ORDER — SUMATRIPTAN SUCCINATE 50 MG PO TABS
50.0000 mg | ORAL_TABLET | ORAL | 0 refills | Status: DC | PRN
Start: 2023-01-30 — End: 2023-07-11

## 2023-01-30 MED ORDER — QULIPTA 60 MG PO TABS
60.0000 mg | ORAL_TABLET | Freq: Every day | ORAL | 2 refills | Status: DC
Start: 2023-01-30 — End: 2023-01-31

## 2023-01-30 NOTE — Assessment & Plan Note (Signed)
Return to care today for routine follow-up.  Her chief concern remains migraine headaches.  She is currently taking Topamax 50 mg twice daily but states that migraines are largely unchanged in terms of frequency, intensity, and pattern.  She has been prescribed sumatriptan for abortive therapy and has used all 10 tablets since her last appointment.  She is interested in additional treatment options. -Through shared decision making, I have prescribed Qulipta 60 mg daily.  She was instructed to reduce Topamax to 25 mg twice daily x 1 week and then discontinue.  Sumatriptan has been refilled.  We will follow-up in 3 months through video encounter.

## 2023-01-30 NOTE — Progress Notes (Signed)
Established Patient Office Visit  Subjective   Patient ID: Crystal Hines, female    DOB: January 26, 1996  Age: 27 y.o. MRN: [id number]  Chief Complaint  Patient presents with   Migraine    Six month follow up, patient states they are still terrible    Crystal Hines returns to care today for routine follow-up.  She was last evaluated by me on 2/21 at which time she endorsed frequent migraine headaches.  Topamax 25 mg twice daily was started for migraine prophylaxis and she was instructed to increase to 50 mg twice daily after one week if the initial dose proved ineffective.  Imitrex was also prescribed for abortive therapy.  There have been no acute interval events.  Crystal Hines reports feeling fairly well today.  She states that her migraine headaches are largely unchanged in terms of frequency, pattern, and intensity.  She has been taking Topamax 50 mg twice daily as instructed.  Her additional concern today is her blood pressure.  She wants to make sure it is not elevated and that she does not have hypertension.  Past Medical History:  Diagnosis Date   Pregnancy induced hypertension    Pregnant 08/23/2015   Vaginal Pap smear, abnormal    Past Surgical History:  Procedure Laterality Date   NO PAST SURGERIES     TUBAL LIGATION Bilateral 08/06/2019   Procedure: POST PARTUM TUBAL LIGATION;  Surgeon: Catalina Antigua, MD;  Location: [facility];  Service: Gynecology;  Laterality: Bilateral;   tubaligation     Social History   Tobacco Use   Smoking status: Former    Types: Cigarettes   Smokeless tobacco: Never  Vaping Use   Vaping status: Never Used  Substance Use Topics   Alcohol use: Yes    Comment: every other weekend   Drug use: Yes    Types: Marijuana   Family History  Problem Relation Age of Onset   Hypertension Mother    Hypertension Father    Other Father        blood clots   Hypertension Maternal Grandmother    Hypertension Maternal Grandfather    Congestive Heart  Failure Paternal Grandfather    No Known Allergies  Review of Systems  Constitutional:  Negative for chills and fever.  HENT:  Negative for sore throat.   Respiratory:  Negative for cough and shortness of breath.   Cardiovascular:  Negative for chest pain, palpitations and leg swelling.  Gastrointestinal:  Negative for abdominal pain, blood in stool, constipation, diarrhea, nausea and vomiting.  Genitourinary:  Negative for dysuria and hematuria.  Musculoskeletal:  Negative for myalgias.  Skin:  Negative for itching and rash.  Neurological:  Positive for headaches. Negative for dizziness.  Psychiatric/Behavioral:  Negative for depression and suicidal ideas.      Objective:     BP 138/78 (BP Location: Right Arm, Patient Position: Sitting, Cuff Size: Normal)   Pulse 82   Ht 5\' 7"  (1.702 m)   Wt 256 lb 12.8 oz (116.5 kg)   SpO2 98%   BMI 40.22 kg/m  BP Readings from Last 3 Encounters:  01/30/23 138/78  08/01/22 133/83  04/13/22 (!) 147/87   Physical Exam Vitals reviewed.  Constitutional:      General: She is not in acute distress.    Appearance: Normal appearance. She is well-developed. She is obese. She is not toxic-appearing.  HENT:     Head: Normocephalic and atraumatic.     Right Ear: External ear normal.  Left Ear: External ear normal.     Nose: Nose normal. No congestion or rhinorrhea.     Mouth/Throat:     Mouth: Mucous membranes are moist.     Pharynx: Oropharynx is clear. No oropharyngeal exudate or posterior oropharyngeal erythema.  Eyes:     General: No scleral icterus.    Extraocular Movements: Extraocular movements intact.     Conjunctiva/sclera: Conjunctivae normal.     Pupils: Pupils are equal, round, and reactive to light.  Cardiovascular:     Rate and Rhythm: Normal rate and regular rhythm.     Pulses: Normal pulses.     Heart sounds: Normal heart sounds. No murmur heard.    No friction rub. No gallop.  Pulmonary:     Effort: Pulmonary effort  is normal.     Breath sounds: Normal breath sounds. No wheezing, rhonchi or rales.  Abdominal:     General: Abdomen is flat. Bowel sounds are normal. There is no distension.     Palpations: Abdomen is soft.     Tenderness: There is no abdominal tenderness.  Musculoskeletal:        General: No swelling. Normal range of motion.     Cervical back: Normal range of motion.     Right lower leg: No edema.     Left lower leg: No edema.  Lymphadenopathy:     Cervical: No cervical adenopathy.  Skin:    General: Skin is warm and dry.     Capillary Refill: Capillary refill takes less than 2 seconds.     Coloration: Skin is not jaundiced.  Neurological:     General: No focal deficit present.     Mental Status: She is alert and oriented to person, place, and time.  Psychiatric:        Mood and Affect: Mood normal.        Behavior: Behavior normal.   Last CBC Lab Results  Component Value Date   WBC 9.3 08/07/2019   HGB 9.8 (L) 08/07/2019   HCT 32.1 (L) 08/07/2019   MCV 77.7 (L) 08/07/2019   MCH 23.7 (L) 08/07/2019   RDW 15.3 08/07/2019   PLT 243 08/07/2019   Last metabolic panel Lab Results  Component Value Date   GLUCOSE 78 05/01/2022   NA 138 05/01/2022   K 4.2 05/01/2022   CL 103 05/01/2022   CO2 22 05/01/2022   BUN 10 05/01/2022   CREATININE 0.82 05/01/2022   EGFR 100 05/01/2022   CALCIUM 9.0 05/01/2022   PROT 6.2 (L) 08/07/2019   ALBUMIN 2.7 (L) 08/07/2019   LABGLOB 3.3 02/23/2019   AGRATIO 1.4 02/23/2019   BILITOT 0.6 08/07/2019   ALKPHOS 94 08/07/2019   AST 28 08/07/2019   ALT 23 08/07/2019   ANIONGAP 9 08/07/2019   Last hemoglobin A1c Lab Results  Component Value Date   HGBA1C 5.3 05/01/2022   Last thyroid functions Lab Results  Component Value Date   TSH 1.270 05/01/2022     Assessment & Plan:   Problem List Items Addressed This Visit       Migraines - Primary    Return to care today for routine follow-up.  Her chief concern remains migraine  headaches.  She is currently taking Topamax 50 mg twice daily but states that migraines are largely unchanged in terms of frequency, intensity, and pattern.  She has been prescribed sumatriptan for abortive therapy and has used all 10 tablets since her last appointment.  She is interested in additional treatment  options. -Through shared decision making, I have prescribed Qulipta 60 mg daily.  She was instructed to reduce Topamax to 25 mg twice daily x 1 week and then discontinue.  Sumatriptan has been refilled.  We will follow-up in 3 months through video encounter.      Return in about 3 months (around 05/02/2023) for Migraines.   Billie Lade, MD

## 2023-01-30 NOTE — Patient Instructions (Signed)
It was a pleasure to see you today.  Thank you for giving [place] the opportunity to be involved in your care.  Below is a brief recap of your visit and next steps.  We will plan to see you again in 3 months.  Summary Start Qulipta for migraines Reduce topamax to 25 mg twice daily x 1 week then discontinue Follow up in 3 months for video visit

## 2023-01-31 ENCOUNTER — Telehealth: Payer: Self-pay

## 2023-01-31 DIAGNOSIS — G43719 Chronic migraine without aura, intractable, without status migrainosus: Secondary | ICD-10-CM

## 2023-01-31 MED ORDER — AIMOVIG 70 MG/ML ~~LOC~~ SOAJ
70.0000 mg | SUBCUTANEOUS | 3 refills | Status: DC
Start: 2023-01-31 — End: 2023-06-18

## 2023-01-31 NOTE — Telephone Encounter (Signed)
Crystal Hines was denied. Preferred medications Aimovig, Ajovy, or Emgality.

## 2023-01-31 NOTE — Telephone Encounter (Signed)
Sent patient a mychart message.

## 2023-02-01 NOTE — Telephone Encounter (Signed)
Spoke with patient in regards to Aimovig. Aimovig has been approved.

## 2023-05-02 ENCOUNTER — Ambulatory Visit: Payer: Medicaid Other | Admitting: Internal Medicine

## 2023-05-15 ENCOUNTER — Encounter: Payer: Self-pay | Admitting: Internal Medicine

## 2023-06-18 ENCOUNTER — Ambulatory Visit (INDEPENDENT_AMBULATORY_CARE_PROVIDER_SITE_OTHER): Payer: Medicaid Other | Admitting: Internal Medicine

## 2023-06-18 ENCOUNTER — Encounter: Payer: Self-pay | Admitting: Internal Medicine

## 2023-06-18 VITALS — BP 134/80 | HR 88 | Ht 67.0 in | Wt 254.8 lb

## 2023-06-18 DIAGNOSIS — G8929 Other chronic pain: Secondary | ICD-10-CM

## 2023-06-18 DIAGNOSIS — M542 Cervicalgia: Secondary | ICD-10-CM | POA: Diagnosis not present

## 2023-06-18 DIAGNOSIS — G43719 Chronic migraine without aura, intractable, without status migrainosus: Secondary | ICD-10-CM | POA: Diagnosis not present

## 2023-06-18 DIAGNOSIS — N62 Hypertrophy of breast: Secondary | ICD-10-CM | POA: Diagnosis not present

## 2023-06-18 DIAGNOSIS — M549 Dorsalgia, unspecified: Secondary | ICD-10-CM | POA: Diagnosis not present

## 2023-06-18 MED ORDER — NURTEC 75 MG PO TBDP
1.0000 | ORAL_TABLET | ORAL | 2 refills | Status: DC
Start: 2023-06-18 — End: 2023-09-20

## 2023-06-18 NOTE — Progress Notes (Signed)
 Acute Office Visit  Subjective:     Patient ID: Crystal Hines, female    DOB: 11-27-1995, 28 y.o.   MRN: [id number]  Chief Complaint  Patient presents with   Migraine    Follow up , migraine shots are not helping    breast reduction     Patient is wanting to be referred for a breast reduction    Ms. Crystal Hines returns to care today for an acute visit to discuss migraines and requesting a referral to plastic surgery for a breast reduction.  She was last evaluated by me in August 2024.  She endorsed poorly controlled migraine headaches at that time.  Ultimately, Aimovig  monthly injections were started.  Today she reports that there has been no significant improvement in headache frequency since starting Aimovig .  Headaches occur 4 out of 7 days of the week.  She has previously tried Topamax  as well.  Her additional concern today is requesting referral to plastic surgery to discuss breast reduction surgery.  She endorses chronic neck and back pain as well as chest wall soreness.  She believes that a breast reduction would improve her symptoms.  Review of Systems  Musculoskeletal:  Positive for back pain and neck pain.  Neurological:  Positive for headaches.      Objective:    BP 134/80 (BP Location: Left Arm, Patient Position: Sitting, Cuff Size: Large)   Pulse 88   Ht 5' 7 (1.702 m)   Wt 254 lb 12.8 oz (115.6 kg)   SpO2 99%   BMI 39.91 kg/m   Physical Exam Vitals reviewed.  Constitutional:      General: She is not in acute distress.    Appearance: Normal appearance. She is well-developed. She is obese. She is not toxic-appearing.  HENT:     Head: Normocephalic and atraumatic.     Right Ear: External ear normal.     Left Ear: External ear normal.     Nose: Nose normal. No congestion or rhinorrhea.     Mouth/Throat:     Mouth: Mucous membranes are moist.     Pharynx: Oropharynx is clear. No oropharyngeal exudate or posterior oropharyngeal erythema.  Eyes:     General:  No scleral icterus.    Extraocular Movements: Extraocular movements intact.     Conjunctiva/sclera: Conjunctivae normal.     Pupils: Pupils are equal, round, and reactive to light.  Cardiovascular:     Rate and Rhythm: Normal rate and regular rhythm.     Pulses: Normal pulses.     Heart sounds: Normal heart sounds. No murmur heard.    No friction rub. No gallop.  Pulmonary:     Effort: Pulmonary effort is normal.     Breath sounds: Normal breath sounds. No wheezing, rhonchi or rales.  Abdominal:     General: Abdomen is flat. Bowel sounds are normal. There is no distension.     Palpations: Abdomen is soft.     Tenderness: There is no abdominal tenderness.  Musculoskeletal:        General: No swelling. Normal range of motion.     Cervical back: Normal range of motion.     Right lower leg: No edema.     Left lower leg: No edema.  Lymphadenopathy:     Cervical: No cervical adenopathy.  Skin:    General: Skin is warm and dry.     Capillary Refill: Capillary refill takes less than 2 seconds.     Coloration: Skin is not jaundiced.  Neurological:     General: No focal deficit present.     Mental Status: She is alert and oriented to person, place, and time.  Psychiatric:        Mood and Affect: Mood normal.        Behavior: Behavior normal.       Assessment & Plan:   Problem List Items Addressed This Visit       Migraines - Primary   Presenting today for an acute visit to discuss migraine headaches.  Most recently, she has tried Aimovig  without significant improvement in frequency of migraine headaches.  She previously tried Topamax  as well.  Headaches are currently occurring 4 out of 7 days of the week. -Start Nurtec 75 mg every other day for migraine prevention.  -We will tentatively plan for follow-up in 3 months for reassessment      Large breasts   She endorses chronic neck and back pain as well as chest wall soreness that she attributes to large breasts.  Requesting  referral to plastic surgery to discuss breast reduction surgery.  Plastic surgery referral placed today at her request.      Meds ordered this encounter  Medications   Rimegepant Sulfate (NURTEC) 75 MG TBDP    Sig: Take 1 tablet (75 mg total) by mouth every other day.    Dispense:  30 tablet    Refill:  2    Return in about 3 months (around 09/16/2023).  Manus FORBES Fireman, MD

## 2023-06-18 NOTE — Assessment & Plan Note (Signed)
 Presenting today for an acute visit to discuss migraine headaches.  Most recently, she has tried Aimovig  without significant improvement in frequency of migraine headaches.  She previously tried Topamax  as well.  Headaches are currently occurring 4 out of 7 days of the week. -Start Nurtec 75 mg every other day for migraine prevention.  -We will tentatively plan for follow-up in 3 months for reassessment

## 2023-06-18 NOTE — Assessment & Plan Note (Signed)
 She endorses chronic neck and back pain as well as chest wall soreness that she attributes to large breasts.  Requesting referral to plastic surgery to discuss breast reduction surgery.  Plastic surgery referral placed today at her request.

## 2023-06-18 NOTE — Patient Instructions (Signed)
 It was a pleasure to see you today.  Thank you for giving us  the opportunity to be involved in your care.  Below is a brief recap of your visit and next steps.  We will plan to see you again in 3 months.  Summary Start Nurtec for migraines Plastic surgery referral placed Follow up in 3 months

## 2023-06-19 ENCOUNTER — Telehealth: Payer: Self-pay

## 2023-06-19 NOTE — Telephone Encounter (Signed)
 Copied from CRM [id number]. Topic: Clinical - Medication Question >> Jun 19, 2023  4:14 PM Geneva B wrote: Reason for CRM: patient needs prior authorization Rimegepant Sulfate (NURTEC) 75 MG TBDP

## 2023-06-19 NOTE — Telephone Encounter (Signed)
 PA approved, patient advised to contact pharmacy

## 2023-07-11 ENCOUNTER — Ambulatory Visit: Payer: Medicaid Other | Admitting: Plastic Surgery

## 2023-07-11 ENCOUNTER — Encounter: Payer: Self-pay | Admitting: Plastic Surgery

## 2023-07-11 VITALS — BP 146/81 | HR 80 | Ht 68.0 in | Wt 259.0 lb

## 2023-07-11 DIAGNOSIS — M542 Cervicalgia: Secondary | ICD-10-CM

## 2023-07-11 DIAGNOSIS — M546 Pain in thoracic spine: Secondary | ICD-10-CM

## 2023-07-11 DIAGNOSIS — Z6839 Body mass index (BMI) 39.0-39.9, adult: Secondary | ICD-10-CM | POA: Diagnosis not present

## 2023-07-11 DIAGNOSIS — N62 Hypertrophy of breast: Secondary | ICD-10-CM

## 2023-07-11 DIAGNOSIS — N6489 Other specified disorders of breast: Secondary | ICD-10-CM

## 2023-07-11 DIAGNOSIS — G8929 Other chronic pain: Secondary | ICD-10-CM

## 2023-07-11 NOTE — Progress Notes (Signed)
Referring Provider Billie Lade, MD [address] [place],  [place] [address]   CC:  Chief Complaint  Patient presents with   Advice Only      Crystal Hines is an 28 y.o. female.  HPI: Crystal Hines is a 28 year old female who presents today with complaints of upper back and neck pain of many years duration which she attributes to the large size of her breast.  She states that her breasts are so large that she cannot purchase bras without getting the custom made and indeed feels that she wears a size N cup.  She states that the bras that she wears tend to digging to her shoulders and increased the amount of pain that she has in her upper back and neck.  She would like to have a bilateral breast reduction.  No Known Allergies  Outpatient Encounter Medications as of 07/11/2023  Medication Sig   Rimegepant Sulfate (NURTEC) 75 MG TBDP Take 1 tablet (75 mg total) by mouth every other day.   [DISCONTINUED] SUMAtriptan (IMITREX) 50 MG tablet Take 1 tablet (50 mg total) by mouth every 2 (two) hours as needed for migraine. May repeat in 2 hours if headache persists or recurs.   No facility-administered encounter medications on file as of 07/11/2023.     Past Medical History:  Diagnosis Date   Pregnancy induced hypertension    Pregnant 08/23/2015   Vaginal Pap smear, abnormal     Past Surgical History:  Procedure Laterality Date   NO PAST SURGERIES     TUBAL LIGATION Bilateral 08/06/2019   Procedure: POST PARTUM TUBAL LIGATION;  Surgeon: Catalina Antigua, MD;  Location: [facility];  Service: Gynecology;  Laterality: Bilateral;   tubaligation      Family History  Problem Relation Age of Onset   Hypertension Mother    Hypertension Father    Other Father        blood clots   Hypertension Maternal Grandmother    Hypertension Maternal Grandfather    Congestive Heart Failure Paternal Grandfather     Social History   Social History Narrative   Not on file     Review of  Systems General: Denies fevers, chills, weight loss CV: Denies chest pain, shortness of breath, palpitations Breast: No specific complaints.  She feels that her upper back and neck pain is due to the large size of her breast.  Her breast interfere with her daily activities.  Physical Exam    07/11/2023    8:53 AM 06/18/2023   11:14 AM 01/30/2023    1:05 PM  Vitals with BMI  Height 5\' 8"  5\' 7"  5\' 7"   Weight 259 lbs 254 lbs 13 oz 256 lbs 13 oz  BMI 39.39 39.9 40.21  Systolic 146 134 540  Diastolic 81 80 78  Pulse 80 88 82    General:  No acute distress,  Alert and oriented, Non-Toxic, Normal speech and affect Breast: Patient has very large heavy pendulous breast with grade 3 ptosis.  There is notable breast asymmetry with the left side larger than the right.  There are no dominant masses on physical examination and the nipples are normal in appearance without evidence of nipple discharge today.  The sternal notch to nipple distance on the right is 38 cm and 39 cm on the left the fold to nipple distance on the right is 22 cm and 22 cm on the left. Mammogram: [place] due to age Assessment/Plan Symptomatic macromastia, breast  asymmetry: Patient has very large dense breasts and would likely benefit from a bilateral breast reduction.  I believe I can remove 1100 g on the right and 1200 g on the left.  We discussed breast reductions at length, I showed her the location of the incisions and we discussed the unpredictable nature of scarring and wound healing.  We discussed the risks of bleeding, infection, and seroma formation.  I told her that I would use drains postoperatively.  We discussed the risk of nipple loss due to nipple ischemia and the possibility of needing to do a free nipple graft.  We discussed the possible difficulty breast-feeding in the future if she elects to have any more children.  We discussed the postoperative limitations of no heavy lifting greater than 20 pounds, no vigorous  activity, no submerging the incisions in water for 6 weeks.  We discussed the importance of early ambulation to prevent DVT.  All questions were answered to her satisfaction.  Photographs were obtained today with her consent.  Will submit her for a bilateral breast reduction at her request.  Santiago Glad 07/11/2023, 9:26 AM

## 2023-07-22 NOTE — Therapy (Deleted)
 OUTPATIENT PHYSICAL THERAPY THORACOLUMBAR EVALUATION   Patient Name: Crystal Hines MRN: [id number] DOB:[birth year], 28 y.o., female Today's Date: 07/22/2023  END OF SESSION:   Past Medical History:  Diagnosis Date   Pregnancy induced hypertension    Pregnant 08/23/2015   Vaginal Pap smear, abnormal    Past Surgical History:  Procedure Laterality Date   NO PAST SURGERIES     TUBAL LIGATION Bilateral 08/06/2019   Procedure: POST PARTUM TUBAL LIGATION;  Surgeon: Catalina Antigua, MD;  Location: [facility];  Service: Gynecology;  Laterality: Bilateral;   tubaligation     Patient Active Problem List   Diagnosis Date Noted   Large breasts 06/18/2023   Obesity 05/04/2022   Migraines 05/01/2022   Postpartum hypertension 09/08/2019   S/P tubal ligation 09/08/2019   History of gestational hypertension 02/23/2019   Cocaine abuse (HCC) 10/20/2016   Abnormal Pap smear of cervix 05/28/2016    PCP: Christel Mormon  REFERRING PROVIDER: Santiago Glad, MD  REFERRING DIAG: N62 (ICD-10-CM) - Macromastia M54.6,G89.29 (ICD-10-CM) - Chronic bilateral thoracic back pain M54.2 (ICD-10-CM) - Neck pain  Rationale for Evaluation and Treatment: Rehabilitation  THERAPY DIAG:  No diagnosis found.  ONSET DATE: chronic                                                                                                                                                                                            SUBJECTIVE STATEMENT: ***  PERTINENT HISTORY:  ***  PAIN:  Are you having pain? {OPRCPAIN:27236}  PRECAUTIONS: {Therapy precautions:24002}  RED FLAGS: {PT Red Flags:29287}   WEIGHT BEARING RESTRICTIONS: {Yes ***/No:24003}  FALLS:  Has patient fallen in last 6 months? {fallsyesno:27318}  LIVING ENVIRONMENT: Lives with: {OPRC lives with:25569::"lives with their family"} Lives in: {Lives in:25570} Stairs: {opstairs:27293} Has following equipment at home: {Assistive  devices:23999}  OCCUPATION: ***  PLOF: {PLOF:24004}  PATIENT GOALS: ***  NEXT MD VISIT: ***  OBJECTIVE:  Note: Objective measures were completed at Evaluation unless otherwise noted.  DIAGNOSTIC FINDINGS:  ***  PATIENT SURVEYS:  {rehab surveys:24030}  COGNITION: Overall cognitive status: {cognition:24006}     SENSATION: {sensation:27233}  MUSCLE LENGTH: Hamstrings: Right *** deg; Left *** deg Maisie Fus test: Right *** deg; Left *** deg  POSTURE: {posture:25561}  PALPATION: ***  LUMBAR ROM:   AROM eval  Flexion   Extension   Right lateral flexion   Left lateral flexion   Right rotation   Left rotation    (Blank rows = not tested)  LOWER EXTREMITY ROM:     {AROM/PROM:27142}  Right eval Left eval  Hip flexion    Hip extension  Hip abduction    Hip adduction    Hip internal rotation    Hip external rotation    Knee flexion    Knee extension    Ankle dorsiflexion    Ankle plantarflexion    Ankle inversion    Ankle eversion     (Blank rows = not tested)  LOWER EXTREMITY MMT:    MMT Right eval Left eval  Hip flexion    Hip extension    Hip abduction    Hip adduction    Hip internal rotation    Hip external rotation    Knee flexion    Knee extension    Ankle dorsiflexion    Ankle plantarflexion    Ankle inversion    Ankle eversion     (Blank rows = not tested)  LUMBAR SPECIAL TESTS:  {lumbar special test:25242}  FUNCTIONAL TESTS:  {Functional tests:24029}  GAIT: Distance walked: *** Assistive device utilized: {Assistive devices:23999} Level of assistance: {Levels of assistance:24026} Comments: ***  TREATMENT DATE: ***                                                                                                                                 PATIENT EDUCATION:  Education details: *** Person educated: {Person educated:25204} Education method: {Education Method:25205} Education comprehension: {Education  Comprehension:25206}  HOME EXERCISE PROGRAM: ***  ASSESSMENT:  CLINICAL IMPRESSION: Patient is a *** y.o. *** who was seen today for physical therapy evaluation and treatment for ***.   OBJECTIVE IMPAIRMENTS: {opptimpairments:25111}.   ACTIVITY LIMITATIONS: {activitylimitations:27494}  PARTICIPATION LIMITATIONS: {participationrestrictions:25113}  PERSONAL FACTORS: {Personal factors:25162} are also affecting patient's functional outcome.   REHAB POTENTIAL: {rehabpotential:25112}  CLINICAL DECISION MAKING: {clinical decision making:25114}  EVALUATION COMPLEXITY: {Evaluation complexity:25115}   GOALS: Goals reviewed with patient? {yes/no:20286}  SHORT TERM GOALS: Target date: ***  *** Baseline: Goal status: INITIAL  2.  *** Baseline:  Goal status: INITIAL  3.  *** Baseline:  Goal status: INITIAL  4.  *** Baseline:  Goal status: INITIAL  5.  *** Baseline:  Goal status: INITIAL  6.  *** Baseline:  Goal status: INITIAL  LONG TERM GOALS: Target date: ***  *** Baseline:  Goal status: INITIAL  2.  *** Baseline:  Goal status: INITIAL  3.  *** Baseline:  Goal status: INITIAL  4.  *** Baseline:  Goal status: INITIAL  5.  *** Baseline:  Goal status: INITIAL  6.  *** Baseline:  Goal status: INITIAL  PLAN:  PT FREQUENCY: {rehab frequency:25116}  PT DURATION: {rehab duration:25117}  PLANNED INTERVENTIONS: {rehab planned interventions:25118::"97110-Therapeutic exercises","97530- Therapeutic [phone]- Neuromuscular re-education","97535- Self EPPI","95188- Manual therapy"}.  PLAN FOR NEXT SESSION: ***   Belva Koziel,CINDY, PT 07/22/2023, 4:04 PM

## 2023-07-24 ENCOUNTER — Encounter (HOSPITAL_COMMUNITY): Payer: Self-pay | Admitting: Physical Therapy

## 2023-07-24 ENCOUNTER — Ambulatory Visit (HOSPITAL_COMMUNITY): Payer: Medicaid Other | Admitting: Physical Therapy

## 2023-07-24 ENCOUNTER — Telehealth (HOSPITAL_COMMUNITY): Payer: Self-pay | Admitting: Physical Therapy

## 2023-07-24 NOTE — Telephone Encounter (Signed)
First no show:  Called and left message that she would need to call the clinic and reschedule her evaluation  Virgina Organ, PT CLT [phone]

## 2023-07-30 ENCOUNTER — Ambulatory Visit: Payer: Self-pay | Admitting: Internal Medicine

## 2023-07-30 NOTE — Telephone Encounter (Addendum)
 Copied from CRM [id number]. Topic: Clinical - Red Word Triage >> Jul 30, 2023  9:42 AM Crystal Hines wrote: Red Word that prompted transfer to Nurse Triage: The patient was calling to reschedule her appointment. However, she states that her insurance had denied her breast reduction and she is in excruciating pain in her back and shoulders.   Chief Complaint: Back pain Frequency: Pain ongoing for 2 years, worsened in last 3 weeks Pertinent Negatives: Patient denies any other symptoms Disposition: [] ED /[] Urgent Care (no appt availability in office) / [x] Appointment(In office/virtual)/ []  [facility]/ [] Home Care/ [] Refused Recommended Disposition /[] [facility]/ []  Follow-up with PCP Additional Notes: Patient stated she is having persistent back pain caused by her large breasts. She stated that Dr. Durwin Nora gave her a referral to a surgeon but her insurance denied breast surgery and she is unable to afford it. Patient has an appointment with Dr. Durwin Nora for tomorrow to discuss options for pain control, but she stated she has no ride to the office and wanted to reschedule. Patient does not want to wait until Thursday for next available appointment. This RN offered to change the current appt to virtual visit instead and patient accepted.   Reason for Disposition  Back pain present > 2 weeks  Answer Assessment - Initial Assessment Questions 1. ONSET: "When did the pain begin?"      Ongoing pain for last 2 years, and pain has worsened in last 3 weeks  2. LOCATION: "Where does it hurt?" (upper, mid or lower back)     Mid back  3. SEVERITY: "How bad is the pain?"  (e.g., Scale 1-10; mild, moderate, or severe)   - MILD (1-3): Doesn't interfere with normal activities.    - MODERATE (4-7): Interferes with normal activities or awakens from sleep.    - SEVERE (8-10): Excruciating pain, unable to do any normal activities.      Between 8 and 9  4. PATTERN: "Is the pain constant?" (e.g., yes,  no; constant, intermittent)      Pain comes and goes  5. RADIATION: "Does the pain shoot into your legs or somewhere else?"     No  6. CAUSE:  "What do you think is causing the back pain?"      Large breast size  7. MEDICINES: "What have you taken so far for the pain?" (e.g., nothing, acetaminophen, NSAIDS)     Has tried pain patch and that does not work  8. OTHER SYMPTOMS: "Do you have any other symptoms?" (e.g., fever, abdomen pain, burning with urination, blood in urine)       No  Protocols used: Back Pain-A-AH

## 2023-07-31 ENCOUNTER — Telehealth (INDEPENDENT_AMBULATORY_CARE_PROVIDER_SITE_OTHER): Payer: Medicaid Other | Admitting: Internal Medicine

## 2023-07-31 ENCOUNTER — Encounter: Payer: Self-pay | Admitting: Internal Medicine

## 2023-07-31 DIAGNOSIS — G8929 Other chronic pain: Secondary | ICD-10-CM | POA: Diagnosis not present

## 2023-07-31 DIAGNOSIS — M546 Pain in thoracic spine: Secondary | ICD-10-CM | POA: Diagnosis not present

## 2023-07-31 MED ORDER — METHYLPREDNISOLONE 4 MG PO TBPK
ORAL_TABLET | ORAL | 0 refills | Status: DC
Start: 2023-07-31 — End: 2023-09-20

## 2023-07-31 MED ORDER — CYCLOBENZAPRINE HCL 5 MG PO TABS
5.0000 mg | ORAL_TABLET | Freq: Three times a day (TID) | ORAL | 1 refills | Status: DC | PRN
Start: 2023-07-31 — End: 2023-09-20

## 2023-07-31 NOTE — Assessment & Plan Note (Signed)
 Evaluated today for an acute visit through virtual encounter in the setting of acute on chronic bilateral thoracic back pain.  Symptoms are attributable to macromastia.  Breast reduction surgery not covered by insurance.  She has been referred to physical therapy and missed her first appointment due to a lack of transportation but is rescheduled for 3/10.  Red flag symptoms identified during today's encounter.  Treatment options were reviewed.  We discussed the importance of a multimodal approach to chronic pain control, with physical therapy being a key component.  She was strongly encouraged to attend her upcoming physical therapy appointment scheduled for 3/10.  I will prescribe a Medrol Dosepak and Flexeril for as needed pain relief.  Okay to use Tylenol/NSAIDs as needed for pain relief going forward.  She will return to care if symptoms worsen or fail to improve.  Otherwise, she is scheduled for routine follow-up in early April.

## 2023-07-31 NOTE — Progress Notes (Signed)
 Virtual Visit via Video Note  I connected with Sivan Backlund on 07/31/23 at  2:00 PM EST by a video enabled telemedicine application and verified that I am speaking with the correct person using two identifiers.  Patient Location: Home Provider Location: Home Office  I discussed the limitations, risks, security, and privacy concerns of performing an evaluation and management service by video and the availability of in person appointments. I also discussed with the patient that there may be a patient responsible charge related to this service. The patient expressed understanding and agreed to proceed.  Subjective: PCP: Billie Lade, MD  Chief Complaint  Patient presents with   Back Pain   Ms. Nichter has been evaluated today for an acute visit through video encounter endorsing acute on chronic thoracic back pain.  She was recently seen by me for an acute visit on 1/7 in the setting of migraines and symptomatic macromastia.  She was referred to plastic surgery to discuss breast reduction.  Unfortunately, breast reduction surgery is not covered by insurance.  Today she endorses worsening thoracic back pain.  Pain is making it difficult for her to complete basic activities of daily living.  Pain is worse with bending forward and extension of the neck.  She was referred to physical therapy by plastic surgery (Dr. Ladona Ridgel).  Unfortunately she was not able to attend her first appointment on 2/12.  Her appointment has been rescheduled for 3/10.  She denies numbness/weakness in her extremities.  She does not have any additional concerns to discuss today.  Ms. Sorter is not taking any medication for pain relief currently.  ROS: Per HPI  Current Outpatient Medications:    cyclobenzaprine (FLEXERIL) 5 MG tablet, Take 1 tablet (5 mg total) by mouth 3 (three) times daily as needed for muscle spasms., Disp: 30 tablet, Rfl: 1   methylPREDNISolone (MEDROL DOSEPAK) 4 MG TBPK tablet, Use as  directed., Disp: 21 each, Rfl: 0   Rimegepant Sulfate (NURTEC) 75 MG TBDP, Take 1 tablet (75 mg total) by mouth every other day., Disp: 30 tablet, Rfl: 2  Assessment and Plan:  Chronic bilateral thoracic back pain Assessment & Plan: Evaluated today for an acute visit through virtual encounter in the setting of acute on chronic bilateral thoracic back pain.  Symptoms are attributable to macromastia.  Breast reduction surgery not covered by insurance.  She has been referred to physical therapy and missed her first appointment due to a lack of transportation but is rescheduled for 3/10.  Red flag symptoms identified during today's encounter.  Treatment options were reviewed.  We discussed the importance of a multimodal approach to chronic pain control, with physical therapy being a key component.  She was strongly encouraged to attend her upcoming physical therapy appointment scheduled for 3/10.  I will prescribe a Medrol Dosepak and Flexeril for as needed pain relief.  Okay to use Tylenol/NSAIDs as needed for pain relief going forward.  She will return to care if symptoms worsen or fail to improve.  Otherwise, she is scheduled for routine follow-up in early April.  Follow Up Instructions: Return if symptoms worsen or fail to improve.   I discussed the assessment and treatment plan with the patient. The patient was provided an opportunity to ask questions, and all were answered. The patient agreed with the plan and demonstrated an understanding of the instructions.   The patient was advised to call back or seek an in-person evaluation if the symptoms worsen or if the condition fails  to improve as anticipated.  The above assessment and management plan was discussed with the patient. The patient verbalized understanding of and has agreed to the management plan.   Billie Lade, MD

## 2023-08-19 ENCOUNTER — Ambulatory Visit (HOSPITAL_COMMUNITY): Payer: Medicaid Other | Attending: Plastic Surgery

## 2023-08-19 ENCOUNTER — Other Ambulatory Visit: Payer: Self-pay

## 2023-08-19 ENCOUNTER — Encounter (HOSPITAL_COMMUNITY): Payer: Self-pay

## 2023-08-19 DIAGNOSIS — M6281 Muscle weakness (generalized): Secondary | ICD-10-CM | POA: Insufficient documentation

## 2023-08-19 DIAGNOSIS — M542 Cervicalgia: Secondary | ICD-10-CM | POA: Diagnosis not present

## 2023-08-19 DIAGNOSIS — N62 Hypertrophy of breast: Secondary | ICD-10-CM | POA: Diagnosis not present

## 2023-08-19 DIAGNOSIS — M546 Pain in thoracic spine: Secondary | ICD-10-CM | POA: Insufficient documentation

## 2023-08-19 DIAGNOSIS — G8929 Other chronic pain: Secondary | ICD-10-CM | POA: Insufficient documentation

## 2023-08-19 NOTE — Therapy (Signed)
 OUTPATIENT PHYSICAL THERAPY THORACOLUMBAR EVALUATION   Patient Name: Crystal Hines MRN: [id number] DOB:1996/04/10, 28 y.o., female Today's Date: 08/19/2023  END OF SESSION:  PT End of Session - 08/19/23 1454     Visit Number 1    Date for PT Re-Evaluation 09/16/23    Authorization Type Medicaid [employer]    Authorization Time Period seeking new auth    Progress Note Due on Visit 8    PT Start Time 1301    PT Stop Time 1341    PT Time Calculation (min) 40 min    Activity Tolerance Patient tolerated treatment well    Behavior During Therapy WFL for tasks assessed/performed             Past Medical History:  Diagnosis Date   Pregnancy induced hypertension    Pregnant 08/23/2015   Vaginal Pap smear, abnormal    Past Surgical History:  Procedure Laterality Date   NO PAST SURGERIES     TUBAL LIGATION Bilateral 08/06/2019   Procedure: POST PARTUM TUBAL LIGATION;  Surgeon: Catalina Antigua, MD;  Location: [facility];  Service: Gynecology;  Laterality: Bilateral;   tubaligation     Patient Active Problem List   Diagnosis Date Noted   Thoracic back pain 07/31/2023   Large breasts 06/18/2023   Obesity 05/04/2022   Migraines 05/01/2022   Postpartum hypertension 09/08/2019   S/P tubal ligation 09/08/2019   History of gestational hypertension 02/23/2019   Cocaine abuse (HCC) 10/20/2016   Abnormal Pap smear of cervix 05/28/2016    PCP: Billie Lade, MD  REFERRING PROVIDER: Billie Lade, MD  REFERRING DIAG:  N62 (ICD-10-CM) - Macromastia  M54.6,G89.29 (ICD-10-CM) - Chronic bilateral thoracic back pain  M54.2 (ICD-10-CM) - Neck pain    Rationale for Evaluation and Treatment: Rehabilitation  THERAPY DIAG:  Macromastia  Chronic bilateral thoracic back pain  Muscle weakness (generalized)  ONSET DATE: 7 years  SUBJECTIVE:                                                                                                                                                                                            SUBJECTIVE STATEMENT: Pt reporting that her back pain due to enlarged breast tissue. This has been causing her long term pain in middle of back. Pt reports prior to pregnancy, she was wearing size H bra and caused small amounts of pain. Since giving birth, increased breast sized has caused increased pain throughout the years. Pt works at [employer] living as [employer].   PERTINENT HISTORY:  HTN related to preganancy.   PAIN:  Are you having pain? Yes: NPRS  scale: 8/10 Pain location: bilateral upper trapezius, thoracic back  Pain description: needles, burning-constant Aggravating factors: bending, lifting Relieving factors: Muscle relaxers, rest with various positions.   PRECAUTIONS: None  RED FLAGS: None   WEIGHT BEARING RESTRICTIONS: No  FALLS:  Has patient fallen in last 6 months? No  PATIENT GOALS: Want to stop hurting.    OBJECTIVE:  Note: Objective measures were completed at Evaluation unless otherwise noted.  DIAGNOSTIC FINDINGS:   PATIENT SURVEYS:  Modified Oswestry 18/50=36%   COGNITION: Overall cognitive status: Within functional limits for tasks assessed     SENSATION: WFL   POSTURE: rounded shoulders, forward head, increased thoracic kyphosis, and flexed trunk   PALPATION: Tender to palpate along thoracic paraspinals.  Tender to palpate bilateral rhomboids Tender to palpate and multiple trigger points along Bilateral UT  CERVICAL ROM:   Active ROM A/PROM (deg) eval  Flexion WNL  Extension WNL  Right lateral flexion WNL  Left lateral flexion WNL  Right rotation WNL  Left rotation WNL   (Blank rows = not tested)  UPPER EXTREMITY ROM:  Active ROM Right eval Left eval  Shoulder flexion 145 145  Shoulder extension    Shoulder abduction 130 135  Shoulder adduction    Shoulder extension    Shoulder internal rotation    Shoulder external rotation    Elbow flexion    Elbow extension     Wrist flexion    Wrist extension    Wrist ulnar deviation    Wrist radial deviation    Wrist pronation    Wrist supination     (Blank rows = not tested)  UPPER EXTREMITY MMT:  MMT Right eval Left eval  Shoulder flexion 3+* 3+*  Shoulder extension    Shoulder abduction 3+* 3+*  Shoulder adduction    Shoulder extension    Shoulder internal rotation    Shoulder external rotation    Middle trapezius    Lower trapezius 3- 3-  Elbow flexion    Elbow extension    Wrist flexion    Wrist extension    Wrist ulnar deviation    Wrist radial deviation    Wrist pronation    Wrist supination    Grip strength     (Blank rows = not tested)  (Blank rows = not tested) GAIT: Distance walked: 42ft Assistive device utilized: None Level of assistance: Complete Independence Comments: Increased thoracic posture.   TREATMENT DATE: 08/19/2023 PT Evaluation and HEP below                                                                                                                                 PATIENT EDUCATION:  Education details: PT Evaluation, findings, prognosis, frequency, attendance policy, and HEP given below. Person educated: Patient Education method: Medical illustrator Education comprehension: verbalized understanding  HOME EXERCISE PROGRAM: Access Code: [email or web address] URL: [email or web address] Date: 08/19/2023 Prepared by: Starling Manns  Exercises - Sidelying Thoracic Rotation  with Open Book  - 1 x daily - 7 x weekly - 3 sets - 10 reps - Doorway Pec Stretch at 60 Degrees Abduction with Arm Straight  - 1 x daily - 7 x weekly - 3 sets - 10 reps - Wall Angels  - 1 x daily - 7 x weekly - 3 sets - 10 reps - Shoulder External Rotation and Scapular Retraction with Resistance  - 1 x daily - 7 x weekly - 3 sets - 10 reps - Standing Shoulder Horizontal Abduction with Resistance  - 1 x daily - 7 x weekly - 3 sets - 10 reps  ASSESSMENT:  CLINICAL  IMPRESSION: Patient is a 28 y.o. female  who was seen today for physical therapy evaluation and treatment for  N62 (ICD-10-CM) - Macromastia  M54.6,G89.29 (ICD-10-CM) - Chronic bilateral thoracic back pain  M54.2 (ICD-10-CM) - Neck pain   Pt demonstrating limitations in functional impairments noted below due to increased breast tissue mass which has drastically impacted pt's UB muscle strength, postural endurance, and chronic bilateral thoracic pain. Pt will benefit from skilled Physical Therapy services to address deficits/limitations in order to improve functional and QOL. .   OBJECTIVE IMPAIRMENTS: decreased activity tolerance, decreased ROM, decreased strength, increased fascial restrictions, postural dysfunction, and pain.   ACTIVITY LIMITATIONS: carrying, lifting, bending, sitting, standing, squatting, stairs, transfers, and locomotion level  PARTICIPATION LIMITATIONS: meal prep, driving, shopping, community activity, occupation, and yard work  PERSONAL FACTORS: Age are also affecting patient's functional outcome.   REHAB POTENTIAL: Fair pt with increased breast tissue size for multiple years, impacting pain  levels.   CLINICAL DECISION MAKING: Stable/uncomplicated  EVALUATION COMPLEXITY: Low   GOALS: Goals reviewed with patient? No  SHORT TERM GOALS: Target date: 09/02/2023  Pt will be independent with HEP in order to demonstrate participation in Physical Therapy POC.  Baseline: Goal status: INITIAL  2.  Pt will report 6/10 pain scale during UB ADLs in order to reduce pain and to optimize function.  Baseline:  Goal status: INITIAL  LONG TERM GOALS: Target date: 09/16/2023   1.  Pt will improve Modified Owestry score by 5 points in order to demonstrate improved pain with functional goals and outcomes. Baseline: See objective.  Goal status: INITIAL  2.  Pt will report 4/10 pain scale during UB ADLs in order to reduce pain and to optimize function lasting greater than > 30  minutes.  Baseline: See objective.  Goal status: INITIAL  3.  Pt will increased BUE MMT by at least 1/ 2 grade in order to improve postural endurance during UB ADLs and reduce overall pain.  Baseline: See objective.  Goal status: INITIAL   PLAN:  PT FREQUENCY: 2x/week  PT DURATION: 4 weeks  PLANNED INTERVENTIONS: 97164- PT Re-evaluation, 97110-Therapeutic exercises, 97530- Therapeutic activity, [id number]- Neuromuscular re-education, 97535- Self Care, 16109- Manual therapy, [id number]- Gait training, [phone]- Electrical stimulation (unattended), [phone]- Electrical stimulation (manual), [id number]- Traction (mechanical), Balance training, Stair training, Taping, Dry Needling, Joint mobilization, Joint manipulation, Spinal manipulation, Spinal mobilization, Cryotherapy, and Moist heat.  PLAN FOR NEXT SESSION: Postural strengthening, potentially taping? Manual for UB postural pain  Elie Goody, DPT [facility] [place] [phone] office  Managed Medicaid Authorization Request  Visit Dx Codes: N62  M54.6, G89.29  Functional Tool Score: Modified Oswestry: 18/50 = 36%  For all possible CPT codes, reference the Planned Interventions line above.     Check all conditions that are expected to impact treatment: {Conditions expected  to impact treatment:Unknown   If treatment provided at initial evaluation, no treatment charged due to lack of authorization.      Nelida Meuse, PT 08/19/2023, 4:08 PM

## 2023-08-21 ENCOUNTER — Other Ambulatory Visit (HOSPITAL_COMMUNITY): Payer: Self-pay

## 2023-08-21 ENCOUNTER — Encounter (HOSPITAL_COMMUNITY)

## 2023-08-21 ENCOUNTER — Telehealth: Payer: Self-pay | Admitting: Pharmacy Technician

## 2023-08-21 NOTE — Telephone Encounter (Signed)
 Pharmacy Patient Advocate Encounter   Received notification from CoverMyMeds that prior authorization for NURTEC ODT 75MG  TABLETS is required/requested.   Insurance verification completed.   The patient is insured through [facility] .   Per test claim: Refill too soon. PA is not needed at this time. Medication was filled 07/31/2023. Next eligible fill date is 08/23/2023.

## 2023-08-28 ENCOUNTER — Encounter (HOSPITAL_COMMUNITY)

## 2023-08-28 ENCOUNTER — Telehealth (HOSPITAL_COMMUNITY): Payer: Self-pay

## 2023-08-28 NOTE — Telephone Encounter (Signed)
 No show #1; Left message on patient's voicemail regarding missed appointment today.  Reminded her of her next appointment on the 27th and of no show policy.  Asked for a call if she is unable to make her next appointment.    11:21 AM, 08/28/23 Crystal Hines Crystal Hines MPT [facility] [place] [phone]

## 2023-09-04 ENCOUNTER — Ambulatory Visit: Payer: Medicaid Other | Admitting: Student

## 2023-09-04 NOTE — Progress Notes (Deleted)
   Referring Provider Billie Lade, MD [address] [place],  [place] [address]   CC: No chief complaint on file.     Crystal Hines is an 28 y.o. female.  HPI: Patient is a 28 y.o. year old female here for follow up after completing physical therapy for pain related to macromastia.   She was seen for initial consult by Dr. Ladona Ridgel on 07/11/2023.  At that time, patient complained of upper back and neck pain due to the large size of her breasts.  Patient reported that she wears an N cup.  On exam, her STN on the right was 38 cm and her STN on the left was 39 cm.  The estimated amount of breast tissue that would be removed at the time of surgery would be 1100 g on the right side and 1200 g on the left side.  Physical therapy was ordered for the patient.  Today,    Review of Systems General: *** MSK: Endorses ongoing back and neck discomfort Skin: *** rashes  Physical Exam    07/11/2023    8:53 AM 06/18/2023   11:14 AM 01/30/2023    1:05 PM  Vitals with BMI  Height 5\' 8"  5\' 7"  5\' 7"   Weight 259 lbs 254 lbs 13 oz 256 lbs 13 oz  BMI 39.39 39.9 40.21  Systolic 146 134 811  Diastolic 81 80 78  Pulse 80 88 82    General:  No acute distress,  Alert and oriented, Non-Toxic, Normal speech and affect Psych: Normal behavior and mood Respiratory: No increased WOB MSK: Ambulatory  Assessment/Plan  Patient is interested in pursuing surgical intervention for bilateral breast reduction. Patient has completed at least 6 weeks of physical therapy for pain related to macromastia.  Discussed with patient we would submit to insurance for authorization, discussed approval could take up to 6 weeks.   Laurena Spies 09/04/2023, 9:12 AM

## 2023-09-05 ENCOUNTER — Encounter (HOSPITAL_COMMUNITY)

## 2023-09-05 ENCOUNTER — Telehealth (HOSPITAL_COMMUNITY): Payer: Self-pay

## 2023-09-05 NOTE — Telephone Encounter (Signed)
 2nd no show, called and spoke to pt who stated she had forgotten about apt and is currently at dentist. Reminded next apt date and time and educated no show policy details.   Becky Sax, LPTA/CLT; [facility] [place] [phone]

## 2023-09-10 ENCOUNTER — Encounter (HOSPITAL_COMMUNITY)

## 2023-09-12 ENCOUNTER — Encounter (HOSPITAL_COMMUNITY)

## 2023-09-17 ENCOUNTER — Encounter (HOSPITAL_COMMUNITY)

## 2023-09-19 ENCOUNTER — Encounter (HOSPITAL_COMMUNITY)

## 2023-09-20 ENCOUNTER — Telehealth: Payer: Self-pay | Admitting: Pharmacy Technician

## 2023-09-20 ENCOUNTER — Other Ambulatory Visit (HOSPITAL_COMMUNITY): Payer: Self-pay

## 2023-09-20 ENCOUNTER — Ambulatory Visit (INDEPENDENT_AMBULATORY_CARE_PROVIDER_SITE_OTHER): Payer: Medicaid Other | Admitting: Internal Medicine

## 2023-09-20 ENCOUNTER — Encounter: Payer: Self-pay | Admitting: Internal Medicine

## 2023-09-20 VITALS — BP 138/82 | HR 83 | Ht 69.0 in | Wt 254.0 lb

## 2023-09-20 DIAGNOSIS — E66812 Obesity, class 2: Secondary | ICD-10-CM | POA: Diagnosis not present

## 2023-09-20 DIAGNOSIS — G43719 Chronic migraine without aura, intractable, without status migrainosus: Secondary | ICD-10-CM | POA: Diagnosis not present

## 2023-09-20 DIAGNOSIS — M546 Pain in thoracic spine: Secondary | ICD-10-CM | POA: Diagnosis not present

## 2023-09-20 DIAGNOSIS — E6609 Other obesity due to excess calories: Secondary | ICD-10-CM | POA: Diagnosis not present

## 2023-09-20 DIAGNOSIS — G8929 Other chronic pain: Secondary | ICD-10-CM

## 2023-09-20 DIAGNOSIS — E669 Obesity, unspecified: Secondary | ICD-10-CM | POA: Diagnosis not present

## 2023-09-20 MED ORDER — NURTEC 75 MG PO TBDP: 1.0000 | ORAL_TABLET | ORAL | 2 refills | Status: AC

## 2023-09-20 MED ORDER — CYCLOBENZAPRINE HCL 5 MG PO TABS
5.0000 mg | ORAL_TABLET | Freq: Three times a day (TID) | ORAL | 3 refills | Status: DC | PRN
Start: 2023-09-20 — End: 2024-02-11

## 2023-09-20 NOTE — Assessment & Plan Note (Signed)
 Current weight 254 pounds.  BMI 37.5.  Lifestyle modifications and good weight loss reviewed today.  Will update basic labs.

## 2023-09-20 NOTE — Assessment & Plan Note (Signed)
 Her acute concern today is increased frequency and intensity of migraine headaches.  She now describes migraines as debilitating with associated nausea, photophobia, and phonophobia.  She states that she has had a headache daily for the last week.  She is taking Nurtec 75 mg every other day for migraine prevention and also using as needed for migraine relief.  She has previously tried Topamax. -Given increased frequency and intensity of headaches, CT head ordered today.  Will also place referral to neurology.

## 2023-09-20 NOTE — Progress Notes (Signed)
 Established Patient Office Visit  Subjective   Patient ID: Crystal Hines, female    DOB: 06-03-1996  Age: 28 y.o. MRN: [id number]  Chief Complaint  Patient presents with   Care Management    Three month follow up   Crystal Hines returns to care today for routine follow-up.  She was last evaluated by me through video encounter 2/19 in the setting of acute on chronic bilateral thoracic back pain.  Medrol Dosepak and Flexeril prescribed for as needed pain relief.  She was strongly encouraged to attend physical therapy 3/10.  There have been no acute interval events. Crystal Hines acute concern today is increased frequency of migraine headaches that she now describes as debilitating.  She says that she has had daily headaches for the past week.  She endorses nausea, photophobia, and phonophobia.  Requesting imaging for further evaluation.  She is taking Nurtec as prescribed.  Past Medical History:  Diagnosis Date   Pregnancy induced hypertension    Pregnant 08/23/2015   Vaginal Pap smear, abnormal    Past Surgical History:  Procedure Laterality Date   NO PAST SURGERIES     TUBAL LIGATION Bilateral 08/06/2019   Procedure: POST PARTUM TUBAL LIGATION;  Surgeon: Catalina Antigua, MD;  Location: [facility];  Service: Gynecology;  Laterality: Bilateral;   tubaligation     Social History   Tobacco Use   Smoking status: Former    Types: Cigarettes   Smokeless tobacco: Never  Vaping Use   Vaping status: Never Used  Substance Use Topics   Alcohol use: Yes    Comment: every other weekend   Drug use: Yes    Types: Marijuana   Family History  Problem Relation Age of Onset   Hypertension Mother    Hypertension Father    Other Father        blood clots   Hypertension Maternal Grandmother    Hypertension Maternal Grandfather    Congestive Heart Failure Paternal Grandfather    No Known Allergies  Review of Systems  Gastrointestinal:  Positive for nausea.  Musculoskeletal:   Positive for back pain (Chronic thoracic back pain).  Neurological:  Positive for headaches.  All other systems reviewed and are negative.    Objective:     BP 138/82   Pulse 83   Ht 5\' 9"  (1.753 m)   Wt 254 lb (115.2 kg)   SpO2 97%   BMI 37.51 kg/m  BP Readings from Last 3 Encounters:  09/20/23 138/82  07/11/23 (!) 146/81  06/18/23 134/80   Physical Exam Vitals reviewed.  Constitutional:      General: She is not in acute distress.    Appearance: Normal appearance. She is obese. She is not toxic-appearing.  HENT:     Head: Normocephalic and atraumatic.     Right Ear: External ear normal.     Left Ear: External ear normal.     Nose: Nose normal. No congestion or rhinorrhea.     Mouth/Throat:     Mouth: Mucous membranes are moist.     Pharynx: Oropharynx is clear. No oropharyngeal exudate or posterior oropharyngeal erythema.  Eyes:     General: No scleral icterus.    Extraocular Movements: Extraocular movements intact.     Conjunctiva/sclera: Conjunctivae normal.     Pupils: Pupils are equal, round, and reactive to light.  Cardiovascular:     Rate and Rhythm: Normal rate and regular rhythm.     Pulses: Normal pulses.     Heart  sounds: Normal heart sounds. No murmur heard.    No friction rub. No gallop.  Pulmonary:     Effort: Pulmonary effort is normal.     Breath sounds: Normal breath sounds. No wheezing, rhonchi or rales.  Abdominal:     General: Abdomen is flat. Bowel sounds are normal. There is no distension.     Palpations: Abdomen is soft.     Tenderness: There is no abdominal tenderness.  Musculoskeletal:        General: No swelling. Normal range of motion.     Cervical back: Normal range of motion.     Right lower leg: No edema.     Left lower leg: No edema.  Lymphadenopathy:     Cervical: No cervical adenopathy.  Skin:    General: Skin is warm and dry.     Capillary Refill: Capillary refill takes less than 2 seconds.     Coloration: Skin is not  jaundiced.  Neurological:     General: No focal deficit present.     Mental Status: She is alert and oriented to person, place, and time.  Psychiatric:        Mood and Affect: Mood normal.        Behavior: Behavior normal.   Last CBC Lab Results  Component Value Date   WBC 9.3 08/07/2019   HGB 9.8 (L) 08/07/2019   HCT 32.1 (L) 08/07/2019   MCV 77.7 (L) 08/07/2019   MCH 23.7 (L) 08/07/2019   RDW 15.3 08/07/2019   PLT 243 08/07/2019   Last metabolic panel Lab Results  Component Value Date   GLUCOSE 78 05/01/2022   NA 138 05/01/2022   K 4.2 05/01/2022   CL 103 05/01/2022   CO2 22 05/01/2022   BUN 10 05/01/2022   CREATININE 0.82 05/01/2022   EGFR 100 05/01/2022   CALCIUM 9.0 05/01/2022   PROT 6.2 (L) 08/07/2019   ALBUMIN 2.7 (L) 08/07/2019   LABGLOB 3.3 02/23/2019   AGRATIO 1.4 02/23/2019   BILITOT 0.6 08/07/2019   ALKPHOS 94 08/07/2019   AST 28 08/07/2019   ALT 23 08/07/2019   ANIONGAP 9 08/07/2019   Last hemoglobin A1c Lab Results  Component Value Date   HGBA1C 5.3 05/01/2022   Last thyroid functions Lab Results  Component Value Date   TSH 1.270 05/01/2022     Assessment & Plan:   Problem List Items Addressed This Visit       Migraines   Her acute concern today is increased frequency and intensity of migraine headaches.  She now describes migraines as debilitating with associated nausea, photophobia, and phonophobia.  She states that she has had a headache daily for the last week.  She is taking Nurtec 75 mg every other day for migraine prevention and also using as needed for migraine relief.  She has previously tried Topamax. -Given increased frequency and intensity of headaches, CT head ordered today.  Will also place referral to neurology.      Obesity   Current weight 254 pounds.  BMI 37.5.  Lifestyle modifications and good weight loss reviewed today.  Will update basic labs.      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  Previously referred to PT but she says it made her pain worse.  She is managing pain with as needed use of Flexeril, Tylenol, and NSAIDs.  We discussed the importance of completing home PT exercises as provided by  PT.      Return in about 6 months (around 03/21/2024).   Billie Lade, MD

## 2023-09-20 NOTE — Patient Instructions (Signed)
 It was a pleasure to see you today.  Thank you for giving [place] the opportunity to be involved in your care.  Below is a brief recap of your visit and next steps.  We will plan to see you again in 6 months.  Summary CT head ordered today and neurology referral placed Repeat labs ordered Follow up in 6 months

## 2023-09-20 NOTE — Telephone Encounter (Signed)
 Pharmacy Patient Advocate Encounter  Received notification from [facility] that Prior Authorization for Nurtec 75MG  dispersible tablets has been DENIED.  Full denial letter will be uploaded to the media tab. See denial reason below.   PA #/Case ID/Reference #: [id number]     Please consider alternate therapy. Medicaid covers Aimovig,Ajovy,Emgality as long as patient has tried/failed 2 triptan therapies.

## 2023-09-20 NOTE — Telephone Encounter (Signed)
 Pharmacy Patient Advocate Encounter   Received notification from CoverMyMeds that prior authorization for Nurtec 75MG  dispersible tablets is required/requested.   Insurance verification completed.   The patient is insured through [facility] .   Per test claim: PA required; PA submitted to above mentioned insurance via CoverMyMeds Key/confirmation #/EOC [id number] Status is pending

## 2023-09-20 NOTE — Assessment & Plan Note (Signed)
 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  Previously referred to PT but she says it made her pain worse.  She is managing pain with as needed use of Flexeril, Tylenol, and NSAIDs.  We discussed the importance of completing home PT exercises as provided by PT.

## 2023-09-21 LAB — CMP14+EGFR
ALT: 13 IU/L (ref 0–32)
AST: 19 IU/L (ref 0–40)
Albumin: 4.3 g/dL (ref 4.0–5.0)
Alkaline Phosphatase: 85 IU/L (ref 44–121)
BUN/Creatinine Ratio: 14 (ref 9–23)
BUN: 12 mg/dL (ref 6–20)
Bilirubin Total: 0.2 mg/dL (ref 0.0–1.2)
CO2: 20 mmol/L (ref 20–29)
Calcium: 8.9 mg/dL (ref 8.7–10.2)
Chloride: 105 mmol/L (ref 96–106)
Creatinine, Ser: 0.84 mg/dL (ref 0.57–1.00)
Globulin, Total: 2.7 g/dL (ref 1.5–4.5)
Glucose: 93 mg/dL (ref 70–99)
Potassium: 4.2 mmol/L (ref 3.5–5.2)
Sodium: 138 mmol/L (ref 134–144)
Total Protein: 7 g/dL (ref 6.0–8.5)
eGFR: 97 mL/min/{1.73_m2} (ref >59)

## 2023-09-21 LAB — LIPID PANEL
Chol/HDL Ratio: 5.2 ratio — ABNORMAL HIGH (ref 0.0–4.4)
Cholesterol, Total: 155 mg/dL (ref 100–199)
HDL: 30 mg/dL — ABNORMAL LOW (ref >39)
LDL Chol Calc (NIH): 97 mg/dL (ref 0–99)
Triglycerides: 157 mg/dL — ABNORMAL HIGH (ref 0–149)
VLDL Cholesterol Cal: 28 mg/dL (ref 5–40)

## 2023-09-21 LAB — CBC WITH DIFFERENTIAL/PLATELET
Basophils Absolute: 0 10*3/uL (ref 0.0–0.2)
Basos: 1 %
EOS (ABSOLUTE): 0.1 10*3/uL (ref 0.0–0.4)
Eos: 2 %
Hematocrit: 37.4 % (ref 34.0–46.6)
Hemoglobin: 11.2 g/dL (ref 11.1–15.9)
Immature Grans (Abs): 0 10*3/uL (ref 0.0–0.1)
Immature Granulocytes: 0 %
Lymphocytes Absolute: 2.2 10*3/uL (ref 0.7–3.1)
Lymphs: 36 %
MCH: 24 pg — ABNORMAL LOW (ref 26.6–33.0)
MCHC: 29.9 g/dL — ABNORMAL LOW (ref 31.5–35.7)
MCV: 80 fL (ref 79–97)
Monocytes Absolute: 0.4 10*3/uL (ref 0.1–0.9)
Monocytes: 7 %
Neutrophils Absolute: 3.3 10*3/uL (ref 1.4–7.0)
Neutrophils: 54 %
Platelets: 359 10*3/uL (ref 150–450)
RBC: 4.67 x10E6/uL (ref 3.77–5.28)
RDW: 15.4 % (ref 11.7–15.4)
WBC: 6.1 10*3/uL (ref 3.4–10.8)

## 2023-09-21 LAB — TSH+FREE T4
Free T4: 1.21 ng/dL (ref 0.82–1.77)
TSH: 1.2 u[IU]/mL (ref 0.450–4.500)

## 2023-09-21 LAB — B12 AND FOLATE PANEL
Folate: 3 ng/mL — ABNORMAL LOW (ref >3.0)
Vitamin B-12: 433 pg/mL (ref 232–1245)

## 2023-09-21 LAB — VITAMIN D 25 HYDROXY (VIT D DEFICIENCY, FRACTURES): Vit D, 25-Hydroxy: 15.2 ng/mL — ABNORMAL LOW (ref 30.0–100.0)

## 2023-09-21 LAB — HEMOGLOBIN A1C
Est. average glucose Bld gHb Est-mCnc: 108 mg/dL
Hgb A1c MFr Bld: 5.4 % (ref 4.8–5.6)

## 2023-09-23 ENCOUNTER — Other Ambulatory Visit (HOSPITAL_COMMUNITY): Payer: Self-pay

## 2023-09-23 ENCOUNTER — Encounter: Payer: Self-pay | Admitting: Internal Medicine

## 2023-09-23 ENCOUNTER — Other Ambulatory Visit: Payer: Self-pay | Admitting: Internal Medicine

## 2023-09-23 DIAGNOSIS — E538 Deficiency of other specified B group vitamins: Secondary | ICD-10-CM

## 2023-09-23 DIAGNOSIS — E559 Vitamin D deficiency, unspecified: Secondary | ICD-10-CM

## 2023-09-23 MED ORDER — FOLIC ACID 1 MG PO TABS: 1.0000 mg | ORAL_TABLET | Freq: Every day | ORAL | 2 refills | Status: AC

## 2023-09-23 MED ORDER — VITAMIN D (ERGOCALCIFEROL) 1.25 MG (50000 UNIT) PO CAPS: 50000.0000 [IU] | ORAL_CAPSULE | ORAL | 0 refills | Status: AC

## 2023-09-24 ENCOUNTER — Encounter (HOSPITAL_COMMUNITY)

## 2023-10-09 ENCOUNTER — Telehealth: Payer: Self-pay

## 2023-10-09 NOTE — Telephone Encounter (Signed)
 Copied from CRM [id number]. Topic: Clinical - Lab/Test Results >> Oct 09, 2023  9:21 AM Carlatta H wrote: Reason for CRM: Please return patients call about CT scan

## 2023-10-11 ENCOUNTER — Ambulatory Visit (HOSPITAL_COMMUNITY)
Admission: RE | Admit: 2023-10-11 | Discharge: 2023-10-11 | Disposition: A | Discharge status: Home / Self Care | Source: Ambulatory Visit | Attending: Internal Medicine | Admitting: Internal Medicine

## 2023-10-11 DIAGNOSIS — R519 Headache, unspecified: Secondary | ICD-10-CM | POA: Diagnosis not present

## 2023-10-11 DIAGNOSIS — G43719 Chronic migraine without aura, intractable, without status migrainosus: Secondary | ICD-10-CM | POA: Diagnosis not present

## 2023-11-07 ENCOUNTER — Ambulatory Visit: Payer: Self-pay | Admitting: Internal Medicine

## 2024-01-22 ENCOUNTER — Ambulatory Visit: Payer: Self-pay | Admitting: Neurology

## 2024-01-22 ENCOUNTER — Encounter: Payer: Self-pay | Admitting: Neurology

## 2024-02-04 ENCOUNTER — Ambulatory Visit: Payer: Self-pay

## 2024-02-04 NOTE — Telephone Encounter (Signed)
 Appt made.

## 2024-02-04 NOTE — Telephone Encounter (Signed)
 FYI Only or Action Required?: FYI only for provider.  Patient was last seen in primary care on 09/20/2023 by Melvenia Manus BRAVO, MD.  Called Nurse Triage reporting Back Pain.  Symptoms began several weeks ago.  Symptoms are: gradually worsening.  Triage Disposition: See PCP Within 2 Weeks  Patient/caregiver understands and will follow disposition?: Yes         Copied from CRM [id number]. Topic: Clinical - Red Word Triage >> Feb 04, 2024 12:57 PM Crystal Hines wrote: Red Word that prompted transfer to Nurse Triage: pain in her back and muscles she can't turn her head hands going numb and she believes something is really wrong and she hurts really bad  Pt num [phone] (M)         Reason for Disposition  Back pain present > 2 weeks  Answer Assessment - Initial Assessment Questions 1. ONSET: When did the pain begin? (e.g., minutes, hours, days)     3 weeks  2. LOCATION: Where does it hurt? (upper, mid or lower back)     Top of back  3. SEVERITY: How bad is the pain?  (e.g., Scale 1-10; mild, moderate, or severe)     Moderate to severe  4. PATTERN: Is the pain constant? (e.g., yes, no; constant, intermittent)      Constant  5. RADIATION: Does the pain shoot into your legs or somewhere else?     Radiates down right shoulder into arm  6. CAUSE:  What do you think is causing the back pain?      Unsure  7. BACK OVERUSE:  Any recent lifting of heavy objects, strenuous work or exercise?     No 9. NEUROLOGIC SYMPTOMS: Do you have any weakness, numbness, or problems with bowel/bladder control?     Numbness and tingling in right arm  10. OTHER SYMPTOMS: Do you have any other symptoms? (e.g., fever, abdomen pain, burning with urination, blood in urine)       No  Protocols used: Back Pain-A-AH

## 2024-02-11 ENCOUNTER — Ambulatory Visit: Payer: Self-pay | Admitting: Nurse Practitioner

## 2024-02-11 ENCOUNTER — Encounter: Payer: Self-pay | Admitting: Nurse Practitioner

## 2024-02-11 VITALS — BP 146/91 | HR 93 | Ht 67.0 in | Wt 251.0 lb

## 2024-02-11 DIAGNOSIS — X503XXA Overexertion from repetitive movements, initial encounter: Secondary | ICD-10-CM | POA: Diagnosis not present

## 2024-02-11 DIAGNOSIS — M62838 Other muscle spasm: Secondary | ICD-10-CM | POA: Diagnosis not present

## 2024-02-11 MED ORDER — CYCLOBENZAPRINE HCL 10 MG PO TABS: 10.0000 mg | ORAL_TABLET | Freq: Three times a day (TID) | ORAL | 0 refills | Status: DC | PRN

## 2024-02-11 MED ORDER — METHYLPREDNISOLONE 4 MG PO TBPK: ORAL_TABLET | ORAL | 0 refills | Status: AC

## 2024-02-11 NOTE — Patient Instructions (Signed)
 1) Overuse syndrome - Medrol  dose pack, ice and cyclobenzaprine  2) Recommend Dual Action Advil /acetaminophen  3) Work note 4) Follow up appt in 10 days

## 2024-02-11 NOTE — Progress Notes (Signed)
 Established Patient Office Visit  Subjective:  Patient ID: Crystal Hines, female    DOB: 05-Jan-1996  Age: 28 y.o. MRN: [id number]  Chief Complaint  Patient presents with   Shoulder Pain    Upper back pain, radiates to shoulder, down arm, into fingers    Patient has a new job and over the last 3 weeks, patient is having muscle spasms to neck, right shoulder, right upper back and is also experiencing upon examination, numbness/tingling to right shoulder and down to wrist/fingers.  Patient has taken a few cyclobenzaprine  5 mg tablets.    Shoulder Pain     No other concerns at this time.   Past Medical History:  Diagnosis Date   Pregnancy induced hypertension    Pregnant 08/23/2015   Vaginal Pap smear, abnormal     Past Surgical History:  Procedure Laterality Date   NO PAST SURGERIES     TUBAL LIGATION Bilateral 08/06/2019   Procedure: POST PARTUM TUBAL LIGATION;  Surgeon: Alger Gong, MD;  Location: [facility] LD [facility];  Service: Gynecology;  Laterality: Bilateral;   tubaligation      Social History   Socioeconomic History   Marital status: Single    Spouse name: Not on file   Number of children: 1   Years of education: Not on file   Highest education level: Not on file  Occupational History   Not on file  Tobacco Use   Smoking status: Former    Types: Cigarettes   Smokeless tobacco: Never  Vaping Use   Vaping status: Never Used  Substance and Sexual Activity   Alcohol use: Yes    Comment: every other weekend   Drug use: Yes    Types: Marijuana   Sexual activity: Yes    Birth control/protection: None, Surgical    Comment: tubal  Other Topics Concern   Not on file  Social History Narrative   Not on file   Social Drivers of Health   Financial Resource Strain: Patient Declined (01/15/2022)   Overall Financial Resource Strain (CARDIA)    Difficulty of Paying Living Expenses: Patient declined  Food Insecurity: No Food Insecurity (01/15/2022)   Hunger Vital Sign     Worried About Running Out of Food in the Last Year: Never true    Ran Out of Food in the Last Year: Never true  Transportation Needs: No Transportation Needs (01/15/2022)   PRAPARE - Administrator, Civil Service (Medical): No    Lack of Transportation (Non-Medical): No  Physical Activity: Inactive (01/15/2022)   Exercise Vital Sign    Days of Exercise per Week: 0 days    Minutes of Exercise per Session: 0 min  Stress: Stress Concern Present (01/15/2022)   [employer] of Occupational Health - Occupational Stress Questionnaire    Feeling of Stress : To some extent  Social Connections: Socially Isolated (01/15/2022)   Social Connection and Isolation Panel    Frequency of Communication with Friends and Family: Never    Frequency of Social Gatherings with Friends and Family: Never    Attends Religious Services: 1 to 4 times per year    Active Member of [employer] or Organizations: No    Attends Banker Meetings: Never    Marital Status: Separated  Intimate Partner Violence: Not At Risk (01/15/2022)   Humiliation, Afraid, Rape, and Kick questionnaire    Fear of Current or Ex-Partner: No    Emotionally Abused: No    Physically Abused: No  Sexually Abused: No    Family History  Problem Relation Age of Onset   Hypertension Mother    Hypertension Father    Other Father        blood clots   Hypertension Maternal Grandmother    Hypertension Maternal Grandfather    Congestive Heart Failure Paternal Grandfather     No Known Allergies  Outpatient Medications Prior to Visit  Medication Sig   cyclobenzaprine  (FLEXERIL ) 5 MG tablet Take 1 tablet (5 mg total) by mouth 3 (three) times daily as needed for muscle spasms.   folic acid  (FOLVITE ) 1 MG tablet Take 1 tablet (1 mg total) by mouth daily.   Rimegepant Sulfate (NURTEC) 75 MG TBDP Take 1 tablet (75 mg total) by mouth every other day.   No facility-administered medications prior to visit.    ROS      Objective:   BP (!) 146/91   Pulse 93   Ht 5' 7 (1.702 m)   Wt 251 lb (113.9 kg)   SpO2 98%   BMI 39.31 kg/m   Vitals:   02/11/24 1452  BP: (!) 146/91  Pulse: 93  Height: 5' 7 (1.702 m)  Weight: 251 lb (113.9 kg)  SpO2: 98%  BMI (Calculated): 39.3    Physical Exam Vitals and nursing note reviewed.  Constitutional:      Appearance: Normal appearance.  HENT:     Head: Normocephalic.     Nose: Nose normal.     Mouth/Throat:     Mouth: Mucous membranes are moist.  Cardiovascular:     Rate and Rhythm: Normal rate and regular rhythm.     Pulses: Normal pulses.     Heart sounds: Normal heart sounds.  Pulmonary:     Effort: Pulmonary effort is normal.     Breath sounds: Normal breath sounds.  Musculoskeletal:        General: Tenderness present.     Cervical back: Tenderness present.  Skin:    General: Skin is warm and dry.  Neurological:     Mental Status: She is alert and oriented to person, place, and time.  Psychiatric:        Mood and Affect: Mood normal.        Behavior: Behavior normal.      No results found for any visits on 02/11/24.  No results found for this or any previous visit (from the past 2160 hours).    Assessment & Plan:  1) ) Overuse syndrome - Medrol  dose pack, ice and cyclobenzaprine  2) Recommend Dual Action Advil /acetaminophen  3) Work note 4) Follow up appt in 10 days   Problem List Items Addressed This Visit   None   No follow-ups on file.   Total time spent: 20 minutes  Neale Carpen, NP  02/11/2024   This document may have been prepared by [facility] Voice Recognition software and as such may include unintentional dictation errors.

## 2024-02-21 ENCOUNTER — Ambulatory Visit: Admitting: Nurse Practitioner

## 2024-03-06 ENCOUNTER — Ambulatory Visit: Admitting: Nurse Practitioner

## 2024-03-09 ENCOUNTER — Ambulatory Visit: Admitting: Nurse Practitioner

## 2024-03-23 ENCOUNTER — Ambulatory Visit

## 2024-03-30 ENCOUNTER — Ambulatory Visit

## 2024-04-09 ENCOUNTER — Encounter: Payer: Self-pay | Admitting: Nurse Practitioner

## 2024-05-06 ENCOUNTER — Other Ambulatory Visit: Payer: Self-pay

## 2024-05-06 MED ORDER — CYCLOBENZAPRINE HCL 10 MG PO TABS: 10.0000 mg | ORAL_TABLET | Freq: Three times a day (TID) | ORAL | 2 refills | Status: AC | PRN
